# Patient Record
Sex: Female | Born: 1950
Health system: Southern US, Community
[De-identification: ages and names within clinical notes are randomized; demographics above are authoritative.]

## PROBLEM LIST (undated history)

## (undated) DIAGNOSIS — R112 Nausea with vomiting, unspecified: Secondary | ICD-10-CM

## (undated) DIAGNOSIS — I10 Essential (primary) hypertension: Secondary | ICD-10-CM

## (undated) DIAGNOSIS — Z9889 Other specified postprocedural states: Secondary | ICD-10-CM

## (undated) DIAGNOSIS — I4891 Unspecified atrial fibrillation: Secondary | ICD-10-CM

## (undated) DIAGNOSIS — M1990 Unspecified osteoarthritis, unspecified site: Secondary | ICD-10-CM

## (undated) DIAGNOSIS — J45909 Unspecified asthma, uncomplicated: Secondary | ICD-10-CM

## (undated) DIAGNOSIS — T8859XA Other complications of anesthesia, initial encounter: Secondary | ICD-10-CM

## (undated) DIAGNOSIS — H6690 Otitis media, unspecified, unspecified ear: Secondary | ICD-10-CM

## (undated) DIAGNOSIS — I499 Cardiac arrhythmia, unspecified: Secondary | ICD-10-CM

## (undated) DIAGNOSIS — J40 Bronchitis, not specified as acute or chronic: Secondary | ICD-10-CM

## (undated) DIAGNOSIS — F419 Anxiety disorder, unspecified: Secondary | ICD-10-CM

## (undated) DIAGNOSIS — A0472 Enterocolitis due to Clostridium difficile, not specified as recurrent: Secondary | ICD-10-CM

## (undated) DIAGNOSIS — T4145XA Adverse effect of unspecified anesthetic, initial encounter: Secondary | ICD-10-CM

## (undated) DIAGNOSIS — E785 Hyperlipidemia, unspecified: Secondary | ICD-10-CM

## (undated) HISTORY — PX: LEEP: SHX91

## (undated) HISTORY — DX: Hyperlipidemia, unspecified: E78.5

## (undated) HISTORY — DX: Unspecified osteoarthritis, unspecified site: M19.90

## (undated) HISTORY — DX: Essential (primary) hypertension: I10

## (undated) HISTORY — DX: Unspecified asthma, uncomplicated: J45.909

## (undated) HISTORY — PX: COLONOSCOPY: SHX174

## (undated) HISTORY — PX: UPPER GASTROINTESTINAL ENDOSCOPY: SHX188

## (undated) HISTORY — DX: Unspecified atrial fibrillation: I48.91

## (undated) HISTORY — PX: TONSILLECTOMY: SUR1361

## (undated) HISTORY — PX: KNEE ARTHROSCOPY: SUR90

---

## 1997-09-28 ENCOUNTER — Other Ambulatory Visit: Admission: RE | Admit: 1997-09-28 | Discharge: 1997-09-28 | Payer: Self-pay | Admitting: Gynecology

## 1998-03-11 ENCOUNTER — Other Ambulatory Visit: Admission: RE | Admit: 1998-03-11 | Discharge: 1998-03-11 | Payer: Self-pay | Admitting: Gynecology

## 1999-03-21 ENCOUNTER — Other Ambulatory Visit: Admission: RE | Admit: 1999-03-21 | Discharge: 1999-03-21 | Payer: Self-pay | Admitting: Gynecology

## 1999-04-12 ENCOUNTER — Encounter (INDEPENDENT_AMBULATORY_CARE_PROVIDER_SITE_OTHER): Payer: Self-pay | Admitting: *Deleted

## 1999-04-12 ENCOUNTER — Other Ambulatory Visit: Admission: RE | Admit: 1999-04-12 | Discharge: 1999-04-12 | Payer: Self-pay | Admitting: Gynecology

## 1999-05-11 ENCOUNTER — Encounter (INDEPENDENT_AMBULATORY_CARE_PROVIDER_SITE_OTHER): Payer: Self-pay | Admitting: Specialist

## 1999-05-11 ENCOUNTER — Ambulatory Visit (HOSPITAL_COMMUNITY): Admission: RE | Admit: 1999-05-11 | Discharge: 1999-05-11 | Payer: Self-pay | Admitting: Gynecology

## 1999-05-31 ENCOUNTER — Encounter: Admission: RE | Admit: 1999-05-31 | Discharge: 1999-05-31 | Payer: Self-pay | Admitting: Orthopedic Surgery

## 1999-05-31 ENCOUNTER — Encounter: Payer: Self-pay | Admitting: Orthopedic Surgery

## 1999-06-22 ENCOUNTER — Encounter: Payer: Self-pay | Admitting: Gynecology

## 1999-06-22 ENCOUNTER — Encounter: Admission: RE | Admit: 1999-06-22 | Discharge: 1999-06-22 | Payer: Self-pay | Admitting: Gynecology

## 1999-08-29 ENCOUNTER — Other Ambulatory Visit: Admission: RE | Admit: 1999-08-29 | Discharge: 1999-08-29 | Payer: Self-pay | Admitting: Gynecology

## 2000-03-19 ENCOUNTER — Other Ambulatory Visit: Admission: RE | Admit: 2000-03-19 | Discharge: 2000-03-19 | Payer: Self-pay | Admitting: Gynecology

## 2000-09-16 ENCOUNTER — Other Ambulatory Visit: Admission: RE | Admit: 2000-09-16 | Discharge: 2000-09-16 | Payer: Self-pay | Admitting: Gynecology

## 2001-03-17 ENCOUNTER — Other Ambulatory Visit: Admission: RE | Admit: 2001-03-17 | Discharge: 2001-03-17 | Payer: Self-pay | Admitting: Gynecology

## 2002-03-23 ENCOUNTER — Other Ambulatory Visit: Admission: RE | Admit: 2002-03-23 | Discharge: 2002-03-23 | Payer: Self-pay | Admitting: Gynecology

## 2003-03-29 ENCOUNTER — Other Ambulatory Visit: Admission: RE | Admit: 2003-03-29 | Discharge: 2003-03-29 | Payer: Self-pay | Admitting: Gynecology

## 2004-03-30 ENCOUNTER — Other Ambulatory Visit: Admission: RE | Admit: 2004-03-30 | Discharge: 2004-03-30 | Payer: Self-pay | Admitting: Gynecology

## 2005-04-23 ENCOUNTER — Other Ambulatory Visit: Admission: RE | Admit: 2005-04-23 | Discharge: 2005-04-23 | Payer: Self-pay | Admitting: Gynecology

## 2005-05-18 ENCOUNTER — Encounter: Admission: RE | Admit: 2005-05-18 | Discharge: 2005-05-18 | Payer: Self-pay | Admitting: Gynecology

## 2005-09-05 ENCOUNTER — Encounter: Admission: RE | Admit: 2005-09-05 | Discharge: 2005-09-05 | Payer: Self-pay | Admitting: Gynecology

## 2005-09-15 ENCOUNTER — Encounter: Admission: RE | Admit: 2005-09-15 | Discharge: 2005-09-15 | Payer: Self-pay | Admitting: Orthopedic Surgery

## 2005-10-03 ENCOUNTER — Encounter: Admission: RE | Admit: 2005-10-03 | Discharge: 2005-10-03 | Payer: Self-pay | Admitting: Orthopedic Surgery

## 2006-04-29 ENCOUNTER — Other Ambulatory Visit: Admission: RE | Admit: 2006-04-29 | Discharge: 2006-04-29 | Payer: Self-pay | Admitting: Gynecology

## 2006-09-11 ENCOUNTER — Encounter: Admission: RE | Admit: 2006-09-11 | Discharge: 2006-09-11 | Payer: Self-pay

## 2006-11-25 ENCOUNTER — Encounter: Admission: RE | Admit: 2006-11-25 | Discharge: 2006-11-25 | Payer: Self-pay | Admitting: Orthopedic Surgery

## 2006-12-12 ENCOUNTER — Encounter: Admission: RE | Admit: 2006-12-12 | Discharge: 2006-12-12 | Payer: Self-pay | Admitting: Internal Medicine

## 2007-09-24 ENCOUNTER — Encounter: Admission: RE | Admit: 2007-09-24 | Discharge: 2007-09-24 | Payer: Self-pay | Admitting: Gynecology

## 2008-01-20 ENCOUNTER — Encounter: Admission: RE | Admit: 2008-01-20 | Discharge: 2008-01-20 | Payer: Self-pay | Admitting: Internal Medicine

## 2008-04-27 ENCOUNTER — Encounter (INDEPENDENT_AMBULATORY_CARE_PROVIDER_SITE_OTHER): Payer: Self-pay | Admitting: *Deleted

## 2008-09-27 ENCOUNTER — Encounter: Admission: RE | Admit: 2008-09-27 | Discharge: 2008-09-27 | Payer: Self-pay | Admitting: Gynecology

## 2008-11-03 ENCOUNTER — Ambulatory Visit (HOSPITAL_BASED_OUTPATIENT_CLINIC_OR_DEPARTMENT_OTHER): Admission: RE | Admit: 2008-11-03 | Discharge: 2008-11-03 | Payer: Self-pay | Admitting: Gynecology

## 2008-11-03 ENCOUNTER — Encounter (INDEPENDENT_AMBULATORY_CARE_PROVIDER_SITE_OTHER): Payer: Self-pay | Admitting: Gynecology

## 2008-11-16 ENCOUNTER — Encounter: Admission: RE | Admit: 2008-11-16 | Discharge: 2008-11-16 | Payer: Self-pay | Admitting: Otolaryngology

## 2009-02-23 ENCOUNTER — Telehealth: Payer: Self-pay | Admitting: Internal Medicine

## 2009-10-12 ENCOUNTER — Encounter: Admission: RE | Admit: 2009-10-12 | Discharge: 2009-10-12 | Payer: Self-pay | Admitting: Gynecology

## 2010-01-31 NOTE — Progress Notes (Signed)
Summary: Schedule Colonoscopy  Phone Note Outgoing Call Call back at [facility] Phone [phone]   Call placed by: Harlow Mares CMA Duncan Dull),  February 23, 2009 3:58 PM Call placed to: Patient Summary of Call: patient states that Dr Felipa Eth told her she can wait a couple more years to have her colonoscopy done. I advised her  that Dr. Marina Goodell which is her GI MD. Has reviewed her chart and decided that she is was due for her repeat colonoscopy in 2010. patient declined to schedule colonoscopy.  Initial call taken by: Harlow Mares CMA Duncan Dull),  February 23, 2009 4:02 PM

## 2010-04-05 LAB — POCT HEMOGLOBIN-HEMACUE: Hemoglobin: 16.6 g/dL — ABNORMAL HIGH (ref 12.0–15.0)

## 2010-05-11 ENCOUNTER — Other Ambulatory Visit: Payer: Self-pay | Admitting: Gynecology

## 2010-05-14 ENCOUNTER — Emergency Department (HOSPITAL_COMMUNITY)
Admission: EM | Admit: 2010-05-14 | Discharge: 2010-05-14 | Disposition: A | Discharge status: Home / Self Care | Payer: BC Managed Care – PPO | Attending: Emergency Medicine | Admitting: Emergency Medicine

## 2010-05-14 ENCOUNTER — Emergency Department (HOSPITAL_COMMUNITY): Payer: BC Managed Care – PPO

## 2010-05-14 DIAGNOSIS — W010XXA Fall on same level from slipping, tripping and stumbling without subsequent striking against object, initial encounter: Secondary | ICD-10-CM | POA: Insufficient documentation

## 2010-05-14 DIAGNOSIS — S7010XA Contusion of unspecified thigh, initial encounter: Secondary | ICD-10-CM | POA: Insufficient documentation

## 2010-05-14 DIAGNOSIS — M79609 Pain in unspecified limb: Secondary | ICD-10-CM | POA: Insufficient documentation

## 2010-05-19 NOTE — Op Note (Signed)
[facility]  Patient:    Danielle Hayes, Danielle Hayes                        MRN: [id number] Proc. Date: 05/11/99 Adm. Date:  [id number] Attending:  Katrina Stack CC:         Gretta Cool, M.D.             [facility]                           Operative Report  PREOPERATIVE DIAGNOSIS:  High-grade squamous intraepithelial lesion, recurrent with endocervical extension beyond and inadequate colposcopy.  POSTOPERATIVE DIAGNOSIS:  High-grade squamous intraepithelial lesion, recurrent with endocervical extension beyond and inadequate colposcopy.  OPERATION:  LEEP cone.  SURGEON:  Gretta Cool, M.D.  ANESTHESIA:  Paracervical block with IV sedation.  DESCRIPTION OF PROCEDURE:  Under excellent paracervical block with IV sedation a LEEP cone was taken using the smallest [place] loop to remove a cylinder of endocervical tissue including all of the transformation zone and extending approximately 1.5 cm down the canal of the cervix.  Her cervix is already significantly shortened because of previous cone, with extensive lesion. Bleeding was virtually nonexistent and no therapy was required to control any significant bleeding.  At this point the procedure was terminated without complication.  The patient returned to the recovery room in excellent condition. DD:  05/11/99 TD:  05/11/99 Job: [id number]

## 2010-06-05 ENCOUNTER — Ambulatory Visit (HOSPITAL_COMMUNITY)
Admission: RE | Admit: 2010-06-05 | Discharge: 2010-06-05 | Disposition: A | Discharge status: Home / Self Care | Payer: BC Managed Care – PPO | Source: Ambulatory Visit | Attending: Orthopedic Surgery | Admitting: Orthopedic Surgery

## 2010-06-05 DIAGNOSIS — M79609 Pain in unspecified limb: Secondary | ICD-10-CM | POA: Insufficient documentation

## 2010-06-05 DIAGNOSIS — M7989 Other specified soft tissue disorders: Secondary | ICD-10-CM

## 2010-09-08 ENCOUNTER — Other Ambulatory Visit: Payer: Self-pay | Admitting: Gynecology

## 2010-09-08 DIAGNOSIS — Z1231 Encounter for screening mammogram for malignant neoplasm of breast: Secondary | ICD-10-CM

## 2010-10-17 ENCOUNTER — Ambulatory Visit
Admission: RE | Admit: 2010-10-17 | Discharge: 2010-10-17 | Disposition: A | Discharge status: Home / Self Care | Payer: BC Managed Care – PPO | Source: Ambulatory Visit | Attending: Gynecology | Admitting: Gynecology

## 2010-10-17 DIAGNOSIS — Z1231 Encounter for screening mammogram for malignant neoplasm of breast: Secondary | ICD-10-CM

## 2010-11-09 ENCOUNTER — Other Ambulatory Visit: Payer: Self-pay | Admitting: Dermatology

## 2011-05-15 ENCOUNTER — Other Ambulatory Visit: Payer: Self-pay | Admitting: Gynecology

## 2011-06-13 ENCOUNTER — Other Ambulatory Visit: Payer: Self-pay | Admitting: Dermatology

## 2011-09-27 ENCOUNTER — Other Ambulatory Visit: Payer: Self-pay | Admitting: Gynecology

## 2011-09-27 DIAGNOSIS — Z1231 Encounter for screening mammogram for malignant neoplasm of breast: Secondary | ICD-10-CM

## 2011-10-18 ENCOUNTER — Ambulatory Visit
Admission: RE | Admit: 2011-10-18 | Discharge: 2011-10-18 | Disposition: A | Discharge status: Home / Self Care | Payer: BC Managed Care – PPO | Source: Ambulatory Visit | Attending: Gynecology | Admitting: Gynecology

## 2011-10-18 DIAGNOSIS — Z1231 Encounter for screening mammogram for malignant neoplasm of breast: Secondary | ICD-10-CM

## 2012-09-09 ENCOUNTER — Other Ambulatory Visit: Payer: Self-pay

## 2012-09-09 DIAGNOSIS — Z1231 Encounter for screening mammogram for malignant neoplasm of breast: Secondary | ICD-10-CM

## 2012-10-21 ENCOUNTER — Ambulatory Visit
Admission: RE | Admit: 2012-10-21 | Discharge: 2012-10-21 | Disposition: A | Discharge status: Home / Self Care | Payer: BC Managed Care – PPO | Source: Ambulatory Visit

## 2012-10-21 DIAGNOSIS — Z1231 Encounter for screening mammogram for malignant neoplasm of breast: Secondary | ICD-10-CM

## 2013-03-31 ENCOUNTER — Encounter: Payer: Self-pay | Admitting: Internal Medicine

## 2013-04-09 ENCOUNTER — Encounter: Payer: Self-pay | Admitting: Internal Medicine

## 2013-05-14 ENCOUNTER — Encounter: Payer: BC Managed Care – PPO | Admitting: Internal Medicine

## 2013-05-27 ENCOUNTER — Ambulatory Visit (AMBULATORY_SURGERY_CENTER): Payer: Self-pay

## 2013-05-27 VITALS — Ht 61.75 in | Wt 140.8 lb

## 2013-05-27 DIAGNOSIS — Z1211 Encounter for screening for malignant neoplasm of colon: Secondary | ICD-10-CM

## 2013-05-27 MED ORDER — MOVIPREP 100 G PO SOLR: 1.0000 | Freq: Once | ORAL | Status: DC

## 2013-05-27 NOTE — Progress Notes (Signed)
No allergies to eggs or soy No past problems with anesthesia No diet/weight loss meds No home oxygen  Has email  Emmi instructions given for colonoscopy 

## 2013-05-29 ENCOUNTER — Encounter: Payer: Self-pay | Admitting: Internal Medicine

## 2013-06-09 ENCOUNTER — Encounter: Payer: BC Managed Care – PPO | Admitting: Internal Medicine

## 2013-06-17 ENCOUNTER — Other Ambulatory Visit: Payer: Self-pay | Admitting: Dermatology

## 2013-07-23 ENCOUNTER — Encounter: Payer: Self-pay | Admitting: Internal Medicine

## 2013-07-23 ENCOUNTER — Ambulatory Visit (AMBULATORY_SURGERY_CENTER): Payer: BC Managed Care – PPO | Admitting: Internal Medicine

## 2013-07-23 VITALS — BP 136/65 | HR 70 | Temp 98.4°F | Resp 26 | Ht 61.0 in | Wt 140.0 lb

## 2013-07-23 DIAGNOSIS — Z1211 Encounter for screening for malignant neoplasm of colon: Secondary | ICD-10-CM

## 2013-07-23 MED ORDER — SODIUM CHLORIDE 0.9 % IV SOLN: 500.0000 mL | INTRAVENOUS | Status: DC

## 2013-07-23 NOTE — Progress Notes (Signed)
A/ox3, pleased with MAC, report to RN 

## 2013-07-23 NOTE — Patient Instructions (Signed)
YOU HAD AN ENDOSCOPIC PROCEDURE TODAY AT THE [facility]: Refer to the procedure report that was given to you for any specific questions about what was found during the examination.  If the procedure report does not answer your questions, please call your gastroenterologist to clarify.  If you requested that your care partner not be given the details of your procedure findings, then the procedure report has been included in a sealed envelope for you to review at your convenience later.  YOU SHOULD EXPECT: Some feelings of bloating in the abdomen. Passage of more gas than usual.  Walking can help get rid of the air that was put into your GI tract during the procedure and reduce the bloating. If you had a lower endoscopy (such as a colonoscopy or flexible sigmoidoscopy) you may notice spotting of blood in your stool or on the toilet paper. If you underwent a bowel prep for your procedure, then you may not have a normal bowel movement for a few days.  DIET: Your first meal following the procedure should be a light meal and then it is ok to progress to your normal diet.  A half-sandwich or bowl of soup is an example of a good first meal.  Heavy or fried foods are harder to digest and may make you feel nauseous or bloated.  Likewise meals heavy in dairy and vegetables can cause extra gas to form and this can also increase the bloating.  Drink plenty of fluids but you should avoid alcoholic beverages for 24 hours.  ACTIVITY: Your care partner should take you home directly after the procedure.  You should plan to take it easy, moving slowly for the rest of the day.  You can resume normal activity the day after the procedure however you should NOT DRIVE or use heavy machinery for 24 hours (because of the sedation medicines used during the test).    SYMPTOMS TO REPORT IMMEDIATELY: A gastroenterologist can be reached at any hour.  During normal business hours, 8:30 AM to 5:00 PM Monday through Friday,  call [phone].  After hours and on weekends, please call the GI answering service at [phone] who will take a message and have the physician on call contact you.   Following lower endoscopy (colonoscopy or flexible sigmoidoscopy):  Excessive amounts of blood in the stool  Significant tenderness or worsening of abdominal pains  Swelling of the abdomen that is new, acute  Fever of 100F or higher  FOLLOW UP: If any biopsies were taken you will be contacted by phone or by letter within the next 1-3 weeks.  Call your gastroenterologist if you have not heard about the biopsies in 3 weeks.  Our staff will call the home number listed on your records the next business day following your procedure to check on you and address any questions or concerns that you may have at that time regarding the information given to you following your procedure. This is a courtesy call and so if there is no answer at the home number and we have not heard from you through the emergency physician on call, we will assume that you have returned to your regular daily activities without incident.  SIGNATURES/CONFIDENTIALITY: You and/or your care partner have signed paperwork which will be entered into your electronic medical record.  These signatures attest to the fact that that the information above on your After Visit Summary has been reviewed and is understood.  Full responsibility of the confidentiality of this   discharge information lies with you and/or your care-partner.  Irregular heart-rate noted.  Dr. Henrene Pastor to look at it.  Patient's husband already called the Cardiologist who is his friend.

## 2013-07-23 NOTE — Op Note (Signed)
[place]  [address] [place], [address]   COLONOSCOPY PROCEDURE REPORT  PATIENT: Danielle Hayes, Danielle Hayes  MR#: [id number] BIRTHDATE: 06-06-1950 , 62  yrs. old GENDER: Female ENDOSCOPIST: Eustace Quail, MD REFERRED [id number] Avva, M.D. PROCEDURE DATE:  07/23/2013 PROCEDURE:   Colonoscopy, screening First Screening Colonoscopy - Avg.  risk and is 50 yrs.  old or older - No.  Prior Negative Screening - Now for repeat screening. 10 or more years since last screening  History of Adenoma - Now for follow-up colonoscopy & has been > or = to 3 yrs.  N/A  Polyps Removed Today? No.  Recommend repeat exam, <10 yrs? No. ASA CLASS:   Class II INDICATIONS:average risk screening.   Normal index exam 10 years ago MEDICATIONS: MAC sedation, administered by CRNA and propofol (Diprivan) 400mg  IV  DESCRIPTION OF PROCEDURE:   After the risks benefits and alternatives of the procedure were thoroughly explained, informed consent was obtained.  A digital rectal exam revealed no abnormalities of the rectum.   The [id number]  endoscope was introduced through the anus and advanced to the cecum, which was identified by both the appendix and ileocecal valve. No adverse events experienced.   The quality of the prep was excellent, using MoviPrep  The instrument was then slowly withdrawn as the colon was fully examined.      COLON FINDINGS: A normal appearing cecum, ileocecal valve, and appendiceal orifice were identified.  The ascending, hepatic flexure, transverse, splenic flexure, descending, sigmoid colon and rectum appeared unremarkable.  No polyps or cancers were seen. Retroflexed views revealed internal hemorrhoids. The time to cecum=3 minutes 35 seconds.  Withdrawal time=9 minutes 53 seconds. The scope was withdrawn and the procedure completed.  COMPLICATIONS: There were no complications.  ENDOSCOPIC IMPRESSION: Normal colon  RECOMMENDATIONS: Continue current  colorectal screening recommendations for "routine risk" patients with a repeat colonoscopy in 10 years.   eSigned:  Eustace Quail, MD 07/23/2013 8:38 AM   cc: Prince Solian, MD and The Patient

## 2013-07-24 ENCOUNTER — Telehealth: Payer: Self-pay | Admitting: *Deleted

## 2013-07-24 NOTE — Telephone Encounter (Signed)
  Follow up Call-  Call back number 07/23/2013  Post procedure Call Back phone  # [phone]  Permission to leave phone message Yes     Patient questions:  Do you have a fever, pain , or abdominal swelling? No. Pain Score  0 *  Have you tolerated food without any problems? Yes.    Have you been able to return to your normal activities? Yes.    Do you have any questions about your discharge instructions: Diet   No. Medications  No. Follow up visit  No.  Do you have questions or concerns about your Care? No.  Actions: * If pain score is 4 or above: No action needed, pain <4.

## 2013-09-23 ENCOUNTER — Other Ambulatory Visit: Payer: Self-pay

## 2013-09-23 DIAGNOSIS — Z1231 Encounter for screening mammogram for malignant neoplasm of breast: Secondary | ICD-10-CM

## 2013-10-22 ENCOUNTER — Ambulatory Visit
Admission: RE | Admit: 2013-10-22 | Discharge: 2013-10-22 | Disposition: A | Discharge status: Home / Self Care | Payer: BC Managed Care – PPO | Source: Ambulatory Visit

## 2013-10-22 DIAGNOSIS — Z1231 Encounter for screening mammogram for malignant neoplasm of breast: Secondary | ICD-10-CM

## 2014-06-04 ENCOUNTER — Ambulatory Visit: Payer: Self-pay | Admitting: Orthopedic Surgery

## 2014-06-04 NOTE — Progress Notes (Signed)
Preoperative surgical orders have been place into the Epic hospital system for Danielle Hayes on 06/04/2014, 12:54 PM  by Mickel Crow for surgery on 06-28-2014.  Preop Total Knee orders including Experal, IV Tylenol, and IV Decadron as long as there are no contraindications to the above medications. Arlee Muslim, PA-C

## 2014-06-21 ENCOUNTER — Other Ambulatory Visit (HOSPITAL_COMMUNITY): Payer: Self-pay | Admitting: Anesthesiology

## 2014-06-21 NOTE — Progress Notes (Signed)
04/12/14-Office visit Dr. Dagmar Hait on chart. 04/16/14-Pre-operative clearance from Dr. Wynonia Lawman on chart. 04/13/14-Pre-operative clearance from Dr. Dagmar Hait on chart. 01/23/13-EKG and office note from Dr. Wynonia Lawman on chart. 01/20/13-Office visit from Dr. Wynonia Lawman on chart. 01/12/13-CXR from [facility] on chart. 01/21/13-Echocardiogram on chart from [facility] .

## 2014-06-21 NOTE — Patient Instructions (Addendum)
Danielle Hayes  06/21/2014   Your procedure is scheduled on: Monday 06/28/2014  Report to [facility] Main  Entrance and follow signs to               [place] at [place] AM.  Call this number if you have problems the morning of surgery [phone]   Remember: ONLY 1 PERSON MAY GO WITH YOU TO SHORT STAY TO GET  READY MORNING OF [place].   Do not eat food or drink liquids :After Midnight.     Take these medicines the morning of surgery with A SIP OF WATER: AMLODIPINE (NORVASC), BUPRIPION (WELLBUTRIN XL)                               You may not have any metal on your body including hair pins and              piercings  Do not wear jewelry, make-up, lotions, powders or perfumes, deodorant             Do not wear nail polish.  Do not shave  48 hours prior to surgery.              Men may shave face and neck.   Do not bring valuables to the hospital. [place].  Contacts, dentures or bridgework may not be worn into surgery.  Leave suitcase in the car. After surgery it may be brought to your room.     Patients discharged the day of surgery will not be allowed to drive home.  Name and phone number of your driver:  Special Instructions: N/A              Please read over the following fact sheets you were given: _____________________________________________________________________             [facility] - Preparing for Surgery Before surgery, you can play an important role.  Because skin is not sterile, your skin needs to be as free of germs as possible.  You can reduce the number of germs on your skin by washing with CHG (chlorahexidine gluconate) soap before surgery.  CHG is an antiseptic cleaner which kills germs and bonds with the skin to continue killing germs even after washing. Please DO NOT use if you have an allergy to CHG or antibacterial soaps.  If  your skin becomes reddened/irritated stop using the CHG and inform your nurse when you arrive at Short Stay. Do not shave (including legs and underarms) for at least 48 hours prior to the first CHG shower.  You may shave your face/neck. Please follow these instructions carefully:  1.  Shower with CHG Soap the night before surgery and the  morning of Surgery.  2.  If you choose to wash your hair, wash your hair first as usual with your  normal  shampoo.  3.  After you shampoo, rinse your hair and body thoroughly to remove the  shampoo.                           4.  Use CHG as you would any other liquid soap.  You can  apply chg directly  to the skin and wash                       Gently with a scrungie or clean washcloth.  5.  Apply the CHG Soap to your body ONLY FROM THE NECK DOWN.   Do not use on face/ open                           Wound or open sores. Avoid contact with eyes, ears mouth and genitals (private parts).                       Wash face,  Genitals (private parts) with your normal soap.             6.  Wash thoroughly, paying special attention to the area where your surgery  will be performed.  7.  Thoroughly rinse your body with warm water from the neck down.  8.  DO NOT shower/wash with your normal soap after using and rinsing off  the CHG Soap.                9.  Pat yourself dry with a clean towel.            10.  Wear clean pajamas.            11.  Place clean sheets on your bed the night of your first shower and do not  sleep with pets. Day of Surgery : Do not apply any lotions/deodorants the morning of surgery.  Please wear clean clothes to the hospital/surgery center.  FAILURE TO FOLLOW THESE INSTRUCTIONS MAY RESULT IN THE CANCELLATION OF YOUR SURGERY PATIENT SIGNATURE_________________________________  NURSE SIGNATURE__________________________________  ________________________________________________________________________   [name]  An incentive  spirometer is a tool that can help keep your lungs clear and active. This tool measures how well you are filling your lungs with each breath. Taking long deep breaths may help reverse or decrease the chance of developing breathing (pulmonary) problems (especially infection) following:  A long period of time when you are unable to move or be active. BEFORE THE PROCEDURE   If the spirometer includes an indicator to show your best effort, your nurse or respiratory therapist will set it to a desired goal.  If possible, sit up straight or lean slightly forward. Try not to slouch.  Hold the incentive spirometer in an upright position. INSTRUCTIONS FOR USE   Sit on the edge of your bed if possible, or sit up as far as you can in bed or on a chair.  Hold the incentive spirometer in an upright position.  Breathe out normally.  Place the mouthpiece in your mouth and seal your lips tightly around it.  Breathe in slowly and as deeply as possible, raising the piston or the ball toward the top of the column.  Hold your breath for 3-5 seconds or for as long as possible. Allow the piston or ball to fall to the bottom of the column.  Remove the mouthpiece from your mouth and breathe out normally.  Rest for a few seconds and repeat Steps 1 through 7 at least 10 times every 1-2 hours when you are awake. Take your time and take a few normal breaths between deep breaths.  The spirometer may include an indicator to show your best effort. Use the indicator as a goal to work toward during each  repetition.  After each set of 10 deep breaths, practice coughing to be sure your lungs are clear. If you have an incision (the cut made at the time of surgery), support your incision when coughing by placing a pillow or rolled up towels firmly against it. Once you are able to get out of bed, walk around indoors and cough well. You may stop using the incentive spirometer when instructed by your caregiver.  RISKS AND  COMPLICATIONS  Take your time so you do not get dizzy or light-headed.  If you are in pain, you may need to take or ask for pain medication before doing incentive spirometry. It is harder to take a deep breath if you are having pain. AFTER USE  Rest and breathe slowly and easily.  It can be helpful to keep track of a log of your progress. Your caregiver can provide you with a simple table to help with this. If you are using the spirometer at home, follow these instructions: [place] IF:   You are having difficultly using the spirometer.  You have trouble using the spirometer as often as instructed.  Your pain medication is not giving enough relief while using the spirometer.  You develop fever of 100.5 F (38.1 C) or higher. SEEK IMMEDIATE MEDICAL CARE IF:   You cough up bloody sputum that had not been present before.  You develop fever of 102 F (38.9 C) or greater.  You develop worsening pain at or near the incision site. MAKE SURE YOU:   Understand these instructions.  Will watch your condition.  Will get help right away if you are not doing well or get worse. Document Released: 04/30/2006 Document Revised: 03/12/2011 Document Reviewed: 07/01/2006 ExitCare Patient Information 2014 Exit[employer], [place].   ________________________________________________________________________  WHAT IS A BLOOD TRANSFUSION? Blood Transfusion Information  A transfusion is the replacement of blood or some of its parts. Blood is made up of multiple cells which provide different functions.  Red blood cells carry oxygen and are used for blood loss replacement.  White blood cells fight against infection.  Platelets control bleeding.  Plasma helps clot blood.  Other blood products are available for specialized needs, such as hemophilia or other clotting disorders. BEFORE THE TRANSFUSION  Who gives blood for transfusions?   Healthy volunteers who are fully evaluated to make sure  their blood is safe. This is blood bank blood. Transfusion therapy is the safest it has ever been in the practice of medicine. Before blood is taken from a donor, a complete history is taken to make sure that person has no history of diseases nor engages in risky social behavior (examples are intravenous drug use or sexual activity with multiple partners). The donor's travel history is screened to minimize risk of transmitting infections, such as malaria. The donated blood is tested for signs of infectious diseases, such as HIV and hepatitis. The blood is then tested to be sure it is compatible with you in order to minimize the chance of a transfusion reaction. If you or a relative donates blood, this is often done in anticipation of surgery and is not appropriate for emergency situations. It takes many days to process the donated blood. RISKS AND COMPLICATIONS Although transfusion therapy is very safe and saves many lives, the main dangers of transfusion include:   Getting an infectious disease.  Developing a transfusion reaction. This is an allergic reaction to something in the blood you were given. Every precaution is taken to prevent this.  The decision to have a blood transfusion has been considered carefully by your caregiver before blood is given. Blood is not given unless the benefits outweigh the risks. AFTER THE TRANSFUSION  Right after receiving a blood transfusion, you will usually feel much better and more energetic. This is especially true if your red blood cells have gotten low (anemic). The transfusion raises the level of the red blood cells which carry oxygen, and this usually causes an energy increase.  The nurse administering the transfusion will monitor you carefully for complications. HOME CARE INSTRUCTIONS  No special instructions are needed after a transfusion. You may find your energy is better. Speak with your caregiver about any limitations on activity for underlying diseases  you may have. SEEK MEDICAL CARE IF:   Your condition is not improving after your transfusion.  You develop redness or irritation at the intravenous (IV) site. SEEK IMMEDIATE MEDICAL CARE IF:  Any of the following symptoms occur over the next 12 hours:  Shaking chills.  You have a temperature by mouth above 102 F (38.9 C), not controlled by medicine.  Chest, back, or muscle pain.  People around you feel you are not acting correctly or are confused.  Shortness of breath or difficulty breathing.  Dizziness and fainting.  You get a rash or develop hives.  You have a decrease in urine output.  Your urine turns a dark color or changes to pink, red, or brown. Any of the following symptoms occur over the next 10 days:  You have a temperature by mouth above 102 F (38.9 C), not controlled by medicine.  Shortness of breath.  Weakness after normal activity.  The white part of the eye turns yellow (jaundice).  You have a decrease in the amount of urine or are urinating less often.  Your urine turns a dark color or changes to pink, red, or brown. Document Released: 12/16/1999 Document Revised: 03/12/2011 Document Reviewed: 08/04/2007 [facility] Patient Information 2014 [place], [place].  _______________________________________________________________________

## 2014-06-22 ENCOUNTER — Encounter (HOSPITAL_COMMUNITY): Payer: Self-pay

## 2014-06-22 ENCOUNTER — Encounter (HOSPITAL_COMMUNITY)
Admission: RE | Admit: 2014-06-22 | Discharge: 2014-06-22 | Disposition: A | Discharge status: Home / Self Care | Payer: BLUE CROSS/BLUE SHIELD | Source: Ambulatory Visit | Attending: Orthopedic Surgery | Admitting: Orthopedic Surgery

## 2014-06-22 DIAGNOSIS — Z01812 Encounter for preprocedural laboratory examination: Secondary | ICD-10-CM | POA: Diagnosis not present

## 2014-06-22 DIAGNOSIS — Z01810 Encounter for preprocedural cardiovascular examination: Secondary | ICD-10-CM | POA: Diagnosis not present

## 2014-06-22 DIAGNOSIS — M1712 Unilateral primary osteoarthritis, left knee: Secondary | ICD-10-CM | POA: Diagnosis not present

## 2014-06-22 HISTORY — DX: Other specified postprocedural states: Z98.890

## 2014-06-22 HISTORY — DX: Other complications of anesthesia, initial encounter: T88.59XA

## 2014-06-22 HISTORY — DX: Anxiety disorder, unspecified: F41.9

## 2014-06-22 HISTORY — DX: Cardiac arrhythmia, unspecified: I49.9

## 2014-06-22 HISTORY — DX: Adverse effect of unspecified anesthetic, initial encounter: T41.45XA

## 2014-06-22 HISTORY — DX: Other specified postprocedural states: R11.2

## 2014-06-22 LAB — CBC
HCT: 42.5 % (ref 36.0–46.0)
Hemoglobin: 15.3 g/dL — ABNORMAL HIGH (ref 12.0–15.0)
MCH: 32.9 pg (ref 26.0–34.0)
MCHC: 36 g/dL (ref 30.0–36.0)
MCV: 91.4 fL (ref 78.0–100.0)
PLATELETS: 193 10*3/uL (ref 150–400)
RBC: 4.65 MIL/uL (ref 3.87–5.11)
RDW: 13.2 % (ref 11.5–15.5)
WBC: 4.8 10*3/uL (ref 4.0–10.5)

## 2014-06-22 LAB — COMPREHENSIVE METABOLIC PANEL
ALBUMIN: 4 g/dL (ref 3.5–5.0)
ALK PHOS: 78 U/L (ref 38–126)
ALT: 30 U/L (ref 14–54)
AST: 30 U/L (ref 15–41)
Anion gap: 7 (ref 5–15)
BUN: 17 mg/dL (ref 6–20)
CHLORIDE: 102 mmol/L (ref 101–111)
CO2: 29 mmol/L (ref 22–32)
Calcium: 9 mg/dL (ref 8.9–10.3)
Creatinine, Ser: 0.56 mg/dL (ref 0.44–1.00)
GFR calc Af Amer: >60 mL/min (ref >60)
GFR calc non Af Amer: >60 mL/min (ref >60)
Glucose, Bld: 85 mg/dL (ref 65–99)
POTASSIUM: 3.7 mmol/L (ref 3.5–5.1)
Sodium: 138 mmol/L (ref 135–145)
Total Bilirubin: 1.1 mg/dL (ref 0.3–1.2)
Total Protein: 6.8 g/dL (ref 6.5–8.1)

## 2014-06-22 LAB — URINALYSIS, ROUTINE W REFLEX MICROSCOPIC
Bilirubin Urine: NEGATIVE
Glucose, UA: NEGATIVE mg/dL
Hgb urine dipstick: NEGATIVE
KETONES UR: NEGATIVE mg/dL
LEUKOCYTES UA: NEGATIVE
Nitrite: NEGATIVE
PROTEIN: NEGATIVE mg/dL
Specific Gravity, Urine: 1.002 — ABNORMAL LOW (ref 1.005–1.030)
UROBILINOGEN UA: 0.2 mg/dL (ref 0.0–1.0)
pH: 6.5 (ref 5.0–8.0)

## 2014-06-22 LAB — PROTIME-INR
INR: 1.03 (ref 0.00–1.49)
PROTHROMBIN TIME: 13.7 s (ref 11.6–15.2)

## 2014-06-22 LAB — SURGICAL PCR SCREEN
MRSA, PCR: NEGATIVE
STAPHYLOCOCCUS AUREUS: NEGATIVE

## 2014-06-22 LAB — APTT: aPTT: 30 seconds (ref 24–37)

## 2014-06-27 ENCOUNTER — Ambulatory Visit: Payer: Self-pay | Admitting: Orthopedic Surgery

## 2014-06-27 NOTE — H&P (Signed)
Danielle Hayes DOB: Jun 06, 1950 Married / Language: English / Race: White Female Date of Admission:  06/28/2014 CC:  Left knee pain History of Present Illness The patient is a 64 year old female who comes in for a preoperative History and Physical. The patient is scheduled for a left total knee arthroplasty to be performed by Dr. Dione Plover. Aluisio, MD at [facility] on 06-28-2014.  The patient is a 64 year old female who presented for follow up of their knee. The patient is being followed for their bilateral knee pain and osteoarthritis. They have undegone cortisone injection in the right knee and a series of Synvisc in the left knee. Symptoms reported include: pain (in the left knee). The patient feels that they are doing poorly and report their pain level to be mild to moderate (in the left). The following medication has been used for pain control: Diclofenac.  Her left knee is what is giving more trouble. She has had viscosupplements and cortisone yet still has pain. She is ready to proceed with knee replacement at this time. Risks and benefits of the surgery have been discussed with the patient and they elect to proceed with surgery.  There are on active contraindications to upcoming procedure such as ongoing infection or progressive neurological disease.  Problem List/Past Medical Primary localized osteoarthritis of left knee (M17.12) Osteoarthritis Hypercholesterolemia Vertigo Hypertension Benign Positional Vertigo Anxiety Disorder  Allergies Synvisc *MUSCULOSKELETAL THERAPY AGENTS* local reaction  Family History Cancer brother Congestive Heart Failure mother Hypertension First Degree Relatives. brother Osteoarthritis mother Heart Disease mother and grandfather mothers side  Social History  Drug/Alcohol Rehab (Previously) no Drug/Alcohol Rehab (Currently) no Children 0 Alcohol use current drinker; drinks wine; less than 5 per week Exercise Exercises  daily; does running / walking, individual sport and gym / weights Current work status retired Number of flights of stairs before winded 2-3 Tobacco / smoke exposure no Pain Contract no Illicit drug use no Marital status married Living situation live with spouse Tobacco use Never smoker. never smoker Advance Directives Living Will, Healthcare POA  Medication History Lisinopril (10MG  Tablet, Oral) Active. BuPROPion HCl ER (XL) (300MG  Tablet ER 24HR, Oral) Active. Atorvastatin Calcium (80MG  Tablet, Oral) Active. Triamterene-HCTZ (37.5-25MG  Tablet, Oral) Active. Diclofenac Sodium (50MG  Tablet DR, Oral) Active. Pennsaid (1.5% Solution, Transdermal) Active. Prometrium (200MG  Capsule, Oral) Active. (every 3 months for 12 days) Vagifem (10MCG Tablet, Vaginal) Active. (2x week) Divigel (1MG /GM Gel, Transdermal) Active. Multiple Vitamin (Oral) Active. Vitamin D (Oral) Specific dose unknown - Active. Omega 3 (Oral) Specific dose unknown - Active. Cal-Mag Aspartate (Oral) Specific dose unknown - Active. Glucosamine Sulfate (Oral) Specific dose unknown - Active. Aspirin Adult Low Strength (81MG  Tablet DR, Oral) Active.   Past Surgical History Arthroscopy of Knee bilateral Tonsillectomy   Review of Systems General Not Present- Chills, Fatigue, Fever, Memory Loss, Night Sweats, Weight Gain and Weight Loss. Skin Not Present- Eczema, Hives, Itching, Lesions and Rash. HEENT Not Present- Dentures, Double Vision, Headache, Hearing Loss, Tinnitus and Visual Loss. Respiratory Not Present- Allergies, Chronic Cough, Coughing up blood, Shortness of breath at rest and Shortness of breath with exertion. Cardiovascular Not Present- Chest Pain, Difficulty Breathing Lying Down, Murmur, Palpitations, Racing/skipping heartbeats and Swelling. Gastrointestinal Not Present- Abdominal Pain, Bloody Stool, Constipation, Diarrhea, Difficulty Swallowing, Heartburn, Jaundice, Loss of  appetitie, Nausea and Vomiting. Female Genitourinary Not Present- Blood in Urine, Discharge, Flank Pain, Incontinence, Painful Urination, Urgency, Urinary frequency, Urinary Retention, Urinating at Night and Weak urinary stream. Musculoskeletal Not Present-  Back Pain, Joint Pain, Joint Swelling, Morning Stiffness, Muscle Pain, Muscle Weakness and Spasms. Neurological Not Present- Blackout spells, Difficulty with balance, Dizziness, Paralysis, Tremor and Weakness. Psychiatric Not Present- Insomnia.   Vitals  Weight: 141 lb Height: 62in Weight was reported by patient. Height was reported by patient. Body Surface Area: 1.65 m Body Mass Index: 25.79 kg/m  BP: 138/86 (Sitting, Right Arm, Standard)   Physical Exam General Mental Status -Alert, cooperative and good historian. General Appearance-pleasant, Not in acute distress. Orientation-Oriented X3. Build & Nutrition-Well nourished and Well developed.  Head and Neck Head-normocephalic, atraumatic . Neck Global Assessment - supple, no bruit auscultated on the right, no bruit auscultated on the left.  Eye Pupil - Bilateral-Regular and Round. Motion - Bilateral-EOMI.  Chest and Lung Exam Auscultation Breath sounds - clear at anterior chest wall and clear at posterior chest wall. Adventitious sounds - No Adventitious sounds.  Cardiovascular Auscultation Rhythm - Regular rate and rhythm. Heart Sounds - S1 WNL and S2 WNL. Murmurs & Other Heart Sounds - Auscultation of the heart reveals - No Murmurs.  Abdomen Palpation/Percussion Tenderness - Abdomen is non-tender to palpation. Rigidity (guarding) - Abdomen is soft. Auscultation Auscultation of the abdomen reveals - Bowel sounds normal.  Female Genitourinary Note: Not done, not pertinent to present illness   Musculoskeletal Note: On exam, she is alert and oriented, in no apparent distress. Her right knee shows no effusion today. She does have a small  Baker's cyst. Range is about 5 to 125. Moderate crepitus on range of motion. Slight tenderness medial greater than lateral with no instability.   Assessment & Plan Primary localized osteoarthritis of left knee (M17.12) Note:Surgical Plans: Left Total Knee Replacement  Disposition: Home  PCP: Dr. Dagmar Hait - Patient has been seen preoperatively and felt to be stable for surgery. Cards: Dr. Wynonia Lawman - Patient has been seen preoperatively and felt to be stable for surgery.  IV Topical TXA  Arlee Muslim, PA-C

## 2014-06-27 NOTE — Anesthesia Preprocedure Evaluation (Addendum)
Anesthesia Evaluation  Patient identified by MRN, date of birth, ID band Patient awake    Reviewed: Allergy & Precautions, NPO status , Patient's Chart, lab work & pertinent test results  History of Anesthesia Complications (+) PONV and history of anesthetic complications  Airway Mallampati: II  TM Distance: >3 FB Neck ROM: Full    Dental  (+) Teeth Intact, Dental Advisory Given   Pulmonary neg pulmonary ROS,    Pulmonary exam normal       Cardiovascular hypertension, Pt. on medications Normal cardiovascular exam    Neuro/Psych PSYCHIATRIC DISORDERS Anxiety negative neurological ROS     GI/Hepatic negative GI ROS, Neg liver ROS,   Endo/Other  negative endocrine ROS  Renal/GU negative Renal ROS     Musculoskeletal  (+) Arthritis -,   Abdominal   Peds  Hematology negative hematology ROS (+)   Anesthesia Other Findings   Reproductive/Obstetrics                           Anesthesia Physical Anesthesia Plan  ASA: II  Anesthesia Plan: MAC and Spinal   Post-op Pain Management:    Induction: Intravenous  Airway Management Planned: Simple Face Mask  Additional Equipment:   Intra-op Plan:   Post-operative Plan:   Informed Consent: I have reviewed the patients History and Physical, chart, labs and discussed the procedure including the risks, benefits and alternatives for the proposed anesthesia with the patient or authorized representative who has indicated his/her understanding and acceptance.   Dental advisory given  Plan Discussed with: CRNA, Anesthesiologist and Surgeon  Anesthesia Plan Comments:        Anesthesia Quick Evaluation

## 2014-06-28 ENCOUNTER — Inpatient Hospital Stay (HOSPITAL_COMMUNITY): Payer: BLUE CROSS/BLUE SHIELD | Admitting: Anesthesiology

## 2014-06-28 ENCOUNTER — Encounter (HOSPITAL_COMMUNITY)
Admission: RE | Disposition: A | Discharge status: Home Health | Payer: Self-pay | Source: Ambulatory Visit | Attending: Orthopedic Surgery

## 2014-06-28 ENCOUNTER — Encounter (HOSPITAL_COMMUNITY): Payer: Self-pay | Admitting: *Deleted

## 2014-06-28 ENCOUNTER — Inpatient Hospital Stay (HOSPITAL_COMMUNITY)
Admission: RE | Admit: 2014-06-28 | Discharge: 2014-06-30 | DRG: 470 | Disposition: A | Discharge status: Home Health | Payer: BLUE CROSS/BLUE SHIELD | Source: Ambulatory Visit | Attending: Orthopedic Surgery | Admitting: Orthopedic Surgery

## 2014-06-28 DIAGNOSIS — E780 Pure hypercholesterolemia: Secondary | ICD-10-CM | POA: Diagnosis present

## 2014-06-28 DIAGNOSIS — E785 Hyperlipidemia, unspecified: Secondary | ICD-10-CM | POA: Diagnosis present

## 2014-06-28 DIAGNOSIS — F419 Anxiety disorder, unspecified: Secondary | ICD-10-CM | POA: Diagnosis present

## 2014-06-28 DIAGNOSIS — I10 Essential (primary) hypertension: Secondary | ICD-10-CM | POA: Diagnosis present

## 2014-06-28 DIAGNOSIS — M179 Osteoarthritis of knee, unspecified: Secondary | ICD-10-CM | POA: Diagnosis present

## 2014-06-28 DIAGNOSIS — M1712 Unilateral primary osteoarthritis, left knee: Secondary | ICD-10-CM | POA: Diagnosis present

## 2014-06-28 DIAGNOSIS — M25562 Pain in left knee: Secondary | ICD-10-CM | POA: Diagnosis present

## 2014-06-28 DIAGNOSIS — Z01812 Encounter for preprocedural laboratory examination: Secondary | ICD-10-CM | POA: Diagnosis not present

## 2014-06-28 DIAGNOSIS — M1710 Unilateral primary osteoarthritis, unspecified knee: Secondary | ICD-10-CM | POA: Diagnosis present

## 2014-06-28 HISTORY — PX: TOTAL KNEE ARTHROPLASTY: SHX125

## 2014-06-28 LAB — ABO/RH: ABO/RH(D): O POS

## 2014-06-28 LAB — TYPE AND SCREEN
ABO/RH(D): O POS
ANTIBODY SCREEN: NEGATIVE

## 2014-06-28 SURGERY — ARTHROPLASTY, KNEE, TOTAL
Anesthesia: Monitor Anesthesia Care | Site: Knee | Laterality: Left

## 2014-06-28 MED ORDER — ONDANSETRON HCL 4 MG/2ML IJ SOLN
INTRAMUSCULAR | Status: DC | PRN
  Administered 2014-06-28: 4 mg via INTRAVENOUS

## 2014-06-28 MED ORDER — CEFAZOLIN SODIUM-DEXTROSE 2-3 GM-% IV SOLR
INTRAVENOUS | Status: AC
  Filled 2014-06-28: qty 50

## 2014-06-28 MED ORDER — SCOPOLAMINE 1 MG/3DAYS TD PT72
MEDICATED_PATCH | TRANSDERMAL | Status: AC
Start: 2014-06-28 — End: 2014-06-28
  Filled 2014-06-28: qty 1

## 2014-06-28 MED ORDER — KCL IN DEXTROSE-NACL 20-5-0.9 MEQ/L-%-% IV SOLN
INTRAVENOUS | Status: DC
  Administered 2014-06-28 – 2014-06-29 (×2): via INTRAVENOUS
  Filled 2014-06-28 (×3): qty 1000

## 2014-06-28 MED ORDER — RIVAROXABAN 10 MG PO TABS
10.0000 mg | ORAL_TABLET | Freq: Every day | ORAL | Status: DC
  Administered 2014-06-29 – 2014-06-30 (×2): 10 mg via ORAL
  Filled 2014-06-28 (×3): qty 1

## 2014-06-28 MED ORDER — POLYETHYLENE GLYCOL 3350 17 G PO PACK: 17.0000 g | PACK | Freq: Every day | ORAL | Status: DC | PRN

## 2014-06-28 MED ORDER — METOCLOPRAMIDE HCL 10 MG PO TABS: 5.0000 mg | ORAL_TABLET | Freq: Three times a day (TID) | ORAL | Status: DC | PRN

## 2014-06-28 MED ORDER — BUPIVACAINE HCL (PF) 0.25 % IJ SOLN
INTRAMUSCULAR | Status: AC
  Filled 2014-06-28: qty 30

## 2014-06-28 MED ORDER — DEXAMETHASONE SODIUM PHOSPHATE 10 MG/ML IJ SOLN
10.0000 mg | Freq: Once | INTRAMUSCULAR | Status: AC
  Administered 2014-06-28: 10 mg via INTRAVENOUS

## 2014-06-28 MED ORDER — ATORVASTATIN CALCIUM 40 MG PO TABS
40.0000 mg | ORAL_TABLET | Freq: Every day | ORAL | Status: DC
  Administered 2014-06-29 – 2014-06-30 (×2): 40 mg via ORAL
  Filled 2014-06-28 (×2): qty 1

## 2014-06-28 MED ORDER — AMLODIPINE BESYLATE 2.5 MG PO TABS
2.5000 mg | ORAL_TABLET | Freq: Every day | ORAL | Status: DC
  Administered 2014-06-29 – 2014-06-30 (×2): 2.5 mg via ORAL
  Filled 2014-06-28 (×2): qty 1

## 2014-06-28 MED ORDER — MENTHOL 3 MG MT LOZG: 1.0000 | LOZENGE | OROMUCOSAL | Status: DC | PRN

## 2014-06-28 MED ORDER — HYDROMORPHONE HCL 1 MG/ML IJ SOLN: 0.2500 mg | INTRAMUSCULAR | Status: DC | PRN

## 2014-06-28 MED ORDER — CEFAZOLIN SODIUM-DEXTROSE 2-3 GM-% IV SOLR
2.0000 g | Freq: Four times a day (QID) | INTRAVENOUS | Status: AC
  Administered 2014-06-28 (×2): 2 g via INTRAVENOUS
  Filled 2014-06-28 (×2): qty 50

## 2014-06-28 MED ORDER — DEXAMETHASONE SODIUM PHOSPHATE 10 MG/ML IJ SOLN
INTRAMUSCULAR | Status: AC
  Filled 2014-06-28: qty 1

## 2014-06-28 MED ORDER — PROPOFOL 10 MG/ML IV BOLUS
INTRAVENOUS | Status: AC
  Filled 2014-06-28: qty 20

## 2014-06-28 MED ORDER — ESTRADIOL 10 MCG VA TABS
1.0000 | ORAL_TABLET | VAGINAL | Status: DC
  Filled 2014-06-28: qty 1

## 2014-06-28 MED ORDER — ACETAMINOPHEN 325 MG PO TABS: 650.0000 mg | ORAL_TABLET | Freq: Four times a day (QID) | ORAL | Status: DC | PRN

## 2014-06-28 MED ORDER — SODIUM CHLORIDE 0.9 % IJ SOLN
INTRAMUSCULAR | Status: AC
  Filled 2014-06-28: qty 50

## 2014-06-28 MED ORDER — KETOROLAC TROMETHAMINE 15 MG/ML IJ SOLN: 7.5000 mg | Freq: Four times a day (QID) | INTRAMUSCULAR | Status: AC | PRN

## 2014-06-28 MED ORDER — ONDANSETRON HCL 4 MG PO TABS: 4.0000 mg | ORAL_TABLET | Freq: Four times a day (QID) | ORAL | Status: DC | PRN

## 2014-06-28 MED ORDER — BUPIVACAINE HCL 0.25 % IJ SOLN
INTRAMUSCULAR | Status: DC | PRN
Start: 2014-06-28 — End: 2014-06-28
  Administered 2014-06-28: 30 mL

## 2014-06-28 MED ORDER — BUPIVACAINE LIPOSOME 1.3 % IJ SUSP
INTRAMUSCULAR | Status: DC | PRN
  Administered 2014-06-28: 20 mL

## 2014-06-28 MED ORDER — BISACODYL 10 MG RE SUPP: 10.0000 mg | Freq: Every day | RECTAL | Status: DC | PRN

## 2014-06-28 MED ORDER — DEXAMETHASONE SODIUM PHOSPHATE 10 MG/ML IJ SOLN
10.0000 mg | Freq: Once | INTRAMUSCULAR | Status: AC
  Administered 2014-06-29: 10 mg via INTRAVENOUS
  Filled 2014-06-28: qty 1

## 2014-06-28 MED ORDER — ONDANSETRON HCL 4 MG/2ML IJ SOLN: 4.0000 mg | Freq: Four times a day (QID) | INTRAMUSCULAR | Status: DC | PRN

## 2014-06-28 MED ORDER — BUPIVACAINE LIPOSOME 1.3 % IJ SUSP
20.0000 mL | Freq: Once | INTRAMUSCULAR | Status: DC
  Filled 2014-06-28: qty 20

## 2014-06-28 MED ORDER — METHOCARBAMOL 500 MG PO TABS
500.0000 mg | ORAL_TABLET | Freq: Four times a day (QID) | ORAL | Status: DC | PRN
  Administered 2014-06-28 – 2014-06-30 (×5): 500 mg via ORAL
  Filled 2014-06-28 (×5): qty 1

## 2014-06-28 MED ORDER — DIPHENHYDRAMINE HCL 12.5 MG/5ML PO ELIX: 12.5000 mg | ORAL_SOLUTION | ORAL | Status: DC | PRN

## 2014-06-28 MED ORDER — MORPHINE SULFATE 2 MG/ML IJ SOLN: 1.0000 mg | INTRAMUSCULAR | Status: DC | PRN

## 2014-06-28 MED ORDER — TRAMADOL HCL 50 MG PO TABS
50.0000 mg | ORAL_TABLET | Freq: Four times a day (QID) | ORAL | Status: DC | PRN
  Administered 2014-06-29 (×2): 50 mg via ORAL
  Administered 2014-06-30: 100 mg via ORAL
  Filled 2014-06-28 (×2): qty 1
  Filled 2014-06-28: qty 2

## 2014-06-28 MED ORDER — PHENOL 1.4 % MT LIQD: 1.0000 | OROMUCOSAL | Status: DC | PRN

## 2014-06-28 MED ORDER — PROPOFOL 10 MG/ML IV BOLUS
INTRAVENOUS | Status: DC | PRN
  Administered 2014-06-28: 20 mg via INTRAVENOUS
  Administered 2014-06-28: 30 mg via INTRAVENOUS

## 2014-06-28 MED ORDER — LACTATED RINGERS IV SOLN
INTRAVENOUS | Status: DC | PRN
  Administered 2014-06-28 (×2): via INTRAVENOUS

## 2014-06-28 MED ORDER — BUPROPION HCL ER (XL) 300 MG PO TB24
300.0000 mg | ORAL_TABLET | Freq: Every day | ORAL | Status: DC
Start: 2014-06-29 — End: 2014-06-30
  Administered 2014-06-29 – 2014-06-30 (×2): 300 mg via ORAL
  Filled 2014-06-28 (×2): qty 1

## 2014-06-28 MED ORDER — SODIUM CHLORIDE 0.9 % IV SOLN: INTRAVENOUS | Status: DC

## 2014-06-28 MED ORDER — METOCLOPRAMIDE HCL 5 MG/ML IJ SOLN: 5.0000 mg | Freq: Three times a day (TID) | INTRAMUSCULAR | Status: DC | PRN

## 2014-06-28 MED ORDER — METHOCARBAMOL 1000 MG/10ML IJ SOLN
500.0000 mg | Freq: Four times a day (QID) | INTRAVENOUS | Status: DC | PRN
  Administered 2014-06-28: 500 mg via INTRAVENOUS
  Filled 2014-06-28 (×2): qty 5

## 2014-06-28 MED ORDER — CHLORHEXIDINE GLUCONATE 4 % EX LIQD: 60.0000 mL | Freq: Once | CUTANEOUS | Status: DC

## 2014-06-28 MED ORDER — ACETAMINOPHEN 500 MG PO TABS
1000.0000 mg | ORAL_TABLET | Freq: Four times a day (QID) | ORAL | Status: AC
  Administered 2014-06-28 – 2014-06-29 (×4): 1000 mg via ORAL
  Filled 2014-06-28 (×5): qty 2

## 2014-06-28 MED ORDER — SODIUM CHLORIDE 0.9 % IJ SOLN
INTRAMUSCULAR | Status: DC | PRN
Start: 2014-06-28 — End: 2014-06-28
  Administered 2014-06-28: 30 mL

## 2014-06-28 MED ORDER — TRANEXAMIC ACID 1000 MG/10ML IV SOLN
1000.0000 mg | INTRAVENOUS | Status: AC
  Administered 2014-06-28: 1000 mg via INTRAVENOUS
  Filled 2014-06-28: qty 10

## 2014-06-28 MED ORDER — ACETAMINOPHEN 650 MG RE SUPP
650.0000 mg | Freq: Four times a day (QID) | RECTAL | Status: DC | PRN
Start: 2014-06-29 — End: 2014-06-30

## 2014-06-28 MED ORDER — FENTANYL CITRATE (PF) 100 MCG/2ML IJ SOLN
INTRAMUSCULAR | Status: AC
  Filled 2014-06-28: qty 2

## 2014-06-28 MED ORDER — MIDAZOLAM HCL 2 MG/2ML IJ SOLN
INTRAMUSCULAR | Status: AC
  Filled 2014-06-28: qty 2

## 2014-06-28 MED ORDER — ACETAMINOPHEN 10 MG/ML IV SOLN
1000.0000 mg | Freq: Once | INTRAVENOUS | Status: AC
  Administered 2014-06-28: 1000 mg via INTRAVENOUS
  Filled 2014-06-28: qty 100

## 2014-06-28 MED ORDER — SCOPOLAMINE 1 MG/3DAYS TD PT72
MEDICATED_PATCH | TRANSDERMAL | Status: DC | PRN
  Administered 2014-06-28: 1 via TRANSDERMAL

## 2014-06-28 MED ORDER — TRIAMTERENE-HCTZ 37.5-25 MG PO TABS
0.5000 | ORAL_TABLET | Freq: Every day | ORAL | Status: DC
  Administered 2014-06-29 – 2014-06-30 (×2): 0.5 via ORAL
  Filled 2014-06-28 (×3): qty 0.5

## 2014-06-28 MED ORDER — DOCUSATE SODIUM 100 MG PO CAPS
100.0000 mg | ORAL_CAPSULE | Freq: Two times a day (BID) | ORAL | Status: DC
  Administered 2014-06-29 – 2014-06-30 (×3): 100 mg via ORAL

## 2014-06-28 MED ORDER — OXYCODONE HCL 5 MG PO TABS
5.0000 mg | ORAL_TABLET | ORAL | Status: DC | PRN
  Administered 2014-06-28: 5 mg via ORAL
  Administered 2014-06-28 (×2): 10 mg via ORAL
  Administered 2014-06-28 – 2014-06-29 (×4): 5 mg via ORAL
  Administered 2014-06-29: 10 mg via ORAL
  Administered 2014-06-30: 5 mg via ORAL
  Filled 2014-06-28: qty 1
  Filled 2014-06-28 (×2): qty 2
  Filled 2014-06-28: qty 1
  Filled 2014-06-28: qty 2
  Filled 2014-06-28 (×2): qty 1
  Filled 2014-06-28: qty 2
  Filled 2014-06-28: qty 1

## 2014-06-28 MED ORDER — PROMETHAZINE HCL 25 MG/ML IJ SOLN: 6.2500 mg | INTRAMUSCULAR | Status: DC | PRN

## 2014-06-28 MED ORDER — FLEET ENEMA 7-19 GM/118ML RE ENEM: 1.0000 | ENEMA | Freq: Once | RECTAL | Status: AC | PRN

## 2014-06-28 MED ORDER — FENTANYL CITRATE (PF) 100 MCG/2ML IJ SOLN
INTRAMUSCULAR | Status: DC | PRN
  Administered 2014-06-28: 100 ug via INTRAVENOUS

## 2014-06-28 MED ORDER — MIDAZOLAM HCL 5 MG/5ML IJ SOLN
INTRAMUSCULAR | Status: DC | PRN
  Administered 2014-06-28: 2 mg via INTRAVENOUS

## 2014-06-28 MED ORDER — CEFAZOLIN SODIUM-DEXTROSE 2-3 GM-% IV SOLR
2.0000 g | INTRAVENOUS | Status: AC
  Administered 2014-06-28: 2 g via INTRAVENOUS

## 2014-06-28 MED ORDER — PROPOFOL INFUSION 10 MG/ML OPTIME
INTRAVENOUS | Status: DC | PRN
  Administered 2014-06-28: 140 ug/kg/min via INTRAVENOUS

## 2014-06-28 SURGICAL SUPPLY — 60 items
BAG DECANTER FOR FLEXI CONT (MISCELLANEOUS) IMPLANT
BAG ZIPLOCK 12X15 (MISCELLANEOUS) IMPLANT
BANDAGE ELASTIC 6 VELCRO ST LF (GAUZE/BANDAGES/DRESSINGS) ×2 IMPLANT
BANDAGE ESMARK 6X9 LF (GAUZE/BANDAGES/DRESSINGS) ×1 IMPLANT
BLADE SAG 18X100X1.27 (BLADE) ×2 IMPLANT
BLADE SAW SGTL 11.0X1.19X90.0M (BLADE) ×2 IMPLANT
BNDG ESMARK 6X9 LF (GAUZE/BANDAGES/DRESSINGS) ×2
BOWL SMART MIX CTS (DISPOSABLE) ×2 IMPLANT
CAPT KNEE TOTAL 3 ATTUNE ×2 IMPLANT
CEMENT HV SMART SET (Cement) ×4 IMPLANT
CUFF TOURN SGL QUICK 34 (TOURNIQUET CUFF) ×1
CUFF TRNQT CYL 34X4X40X1 (TOURNIQUET CUFF) ×1 IMPLANT
DECANTER SPIKE VIAL GLASS SM (MISCELLANEOUS) ×2 IMPLANT
DRAPE EXTREMITY T 121X128X90 (DRAPE) ×2 IMPLANT
DRAPE POUCH INSTRU U-SHP 10X18 (DRAPES) ×2 IMPLANT
DRAPE U-SHAPE 47X51 STRL (DRAPES) ×2 IMPLANT
DRSG ADAPTIC 3X8 NADH LF (GAUZE/BANDAGES/DRESSINGS) ×2 IMPLANT
DRSG PAD ABDOMINAL 8X10 ST (GAUZE/BANDAGES/DRESSINGS) ×2 IMPLANT
DURAPREP 26ML APPLICATOR (WOUND CARE) ×2 IMPLANT
ELECT REM PT RETURN 9FT ADLT (ELECTROSURGICAL) ×2
ELECTRODE REM PT RTRN 9FT ADLT (ELECTROSURGICAL) ×1 IMPLANT
EVACUATOR 1/8 PVC DRAIN (DRAIN) ×2 IMPLANT
FACESHIELD WRAPAROUND (MASK) ×10 IMPLANT
GAUZE SPONGE 4X4 12PLY STRL (GAUZE/BANDAGES/DRESSINGS) ×2 IMPLANT
GLOVE BIO SURGEON STRL SZ7.5 (GLOVE) IMPLANT
GLOVE BIO SURGEON STRL SZ8 (GLOVE) ×4 IMPLANT
GLOVE BIOGEL PI IND STRL 6.5 (GLOVE) IMPLANT
GLOVE BIOGEL PI IND STRL 8 (GLOVE) ×1 IMPLANT
GLOVE BIOGEL PI INDICATOR 6.5 (GLOVE)
GLOVE BIOGEL PI INDICATOR 8 (GLOVE) ×1
GLOVE SURG SS PI 6.5 STRL IVOR (GLOVE) ×2 IMPLANT
GOWN STRL REUS W/TWL LRG LVL3 (GOWN DISPOSABLE) ×2 IMPLANT
GOWN STRL REUS W/TWL XL LVL3 (GOWN DISPOSABLE) IMPLANT
HANDPIECE INTERPULSE COAX TIP (DISPOSABLE) ×1
IMMOBILIZER KNEE 20 (SOFTGOODS) ×2
IMMOBILIZER KNEE 20 THIGH 36 (SOFTGOODS) ×1 IMPLANT
KIT BASIN OR (CUSTOM PROCEDURE TRAY) ×2 IMPLANT
MANIFOLD NEPTUNE II (INSTRUMENTS) ×2 IMPLANT
NDL SAFETY ECLIPSE 18X1.5 (NEEDLE) ×2 IMPLANT
NEEDLE HYPO 18GX1.5 SHARP (NEEDLE) ×2
NS IRRIG 1000ML POUR BTL (IV SOLUTION) ×2 IMPLANT
PACK TOTAL JOINT (CUSTOM PROCEDURE TRAY) ×2 IMPLANT
PADDING CAST COTTON 6X4 STRL (CAST SUPPLIES) ×2 IMPLANT
PEN SKIN MARKING BROAD (MISCELLANEOUS) ×2 IMPLANT
POSITIONER SURGICAL ARM (MISCELLANEOUS) ×2 IMPLANT
SET HNDPC FAN SPRY TIP SCT (DISPOSABLE) ×1 IMPLANT
STRIP CLOSURE SKIN 1/2X4 (GAUZE/BANDAGES/DRESSINGS) ×2 IMPLANT
SUCTION FRAZIER 12FR DISP (SUCTIONS) ×2 IMPLANT
SUT MNCRL AB 4-0 PS2 18 (SUTURE) ×2 IMPLANT
SUT VIC AB 2-0 CT1 27 (SUTURE) ×3
SUT VIC AB 2-0 CT1 TAPERPNT 27 (SUTURE) ×3 IMPLANT
SUT VLOC 180 0 24IN GS25 (SUTURE) ×2 IMPLANT
SYR 20CC LL (SYRINGE) ×2 IMPLANT
SYR 50ML LL SCALE MARK (SYRINGE) ×2 IMPLANT
TOWEL OR 17X26 10 PK STRL BLUE (TOWEL DISPOSABLE) ×2 IMPLANT
TOWEL OR NON WOVEN STRL DISP B (DISPOSABLE) IMPLANT
TRAY FOLEY W/METER SILVER 14FR (SET/KITS/TRAYS/PACK) ×2 IMPLANT
WATER STERILE IRR 1500ML POUR (IV SOLUTION) ×2 IMPLANT
WRAP KNEE MAXI GEL POST OP (GAUZE/BANDAGES/DRESSINGS) ×2 IMPLANT
YANKAUER SUCT BULB TIP 10FT TU (MISCELLANEOUS) ×2 IMPLANT

## 2014-06-28 NOTE — Interval H&P Note (Signed)
History and Physical Interval Note:  06/28/2014 6:56 AM  Danielle Hayes  has presented today for surgery, with the diagnosis of left knee osteoarthritis  The various methods of treatment have been discussed with the patient and family. After consideration of risks, benefits and other options for treatment, the patient has consented to  Procedure(s): LEFT TOTAL KNEE ARTHROPLASTY (Left) as a surgical intervention .  The patient's history has been reviewed, patient examined, no change in status, stable for surgery.  I have reviewed the patient's chart and labs.  Questions were answered to the patient's satisfaction.     Gearlean Alf

## 2014-06-28 NOTE — Addendum Note (Signed)
Addendum  created 06/28/14 1015 by Lissa Morales, CRNA   Modules edited: Anesthesia Blocks and Procedures, Clinical Notes   Clinical Notes:  File: [id number]

## 2014-06-28 NOTE — Op Note (Signed)
Pre-operative diagnosis- Osteoarthritis  Left knee(s)  Post-operative diagnosis- Osteoarthritis Left knee(s)  Procedure-  Left  Total Knee Arthroplasty  Surgeon- Dione Plover. Jax Abdelrahman, MD  Assistant- Ardeen Jourdain, PA-C   Anesthesia-  Spinal  EBL-* No blood loss amount entered *   Drains Hemovac  Tourniquet time- 32 minutes @ 254 mm Hg  Complications- None  Condition-PACU - hemodynamically stable.   Brief Clinical Note  Danielle Hayes is a 64 y.o. year old female with end stage OA of her left knee with progressively worsening pain and dysfunction. She has constant pain, with activity and at rest and significant functional deficits with difficulties even with ADLs. She has had extensive non-op management including analgesics, injections of cortisone and viscosupplements, and home exercise program, but remains in significant pain with significant dysfunction. Radiographs show bone on bone arthritis medial and patellofemoral. She presents now for left Total Knee Arthroplasty.    Procedure in detail---   The patient is brought into the operating room and positioned supine on the operating table. After successful administration of  Spinal,   a tourniquet is placed high on the  Left thigh(s) and the lower extremity is prepped and draped in the usual sterile fashion. Time out is performed by the operating team and then the  Left lower extremity is wrapped in Esmarch, knee flexed and the tourniquet inflated to 300 mmHg.       A midline incision is made with a ten blade through the subcutaneous tissue to the level of the extensor mechanism. A fresh blade is used to make a medial parapatellar arthrotomy. Soft tissue over the proximal medial tibia is subperiosteally elevated to the joint line with a knife and into the semimembranosus bursa with a Cobb elevator. Soft tissue over the proximal lateral tibia is elevated with attention being paid to avoiding the patellar tendon on the tibial tubercle. The  patella is everted, knee flexed 90 degrees and the ACL and PCL are removed. Findings are bone on bone medial and patellofemoral with large global osteophytes.        The drill is used to create a starting hole in the distal femur and the canal is thoroughly irrigated with sterile saline to remove the fatty contents. The 5 degree Left  valgus alignment guide is placed into the femoral canal and the distal femoral cutting block is pinned to remove 10 mm off the distal femur. Resection is made with an oscillating saw.      The tibia is subluxed forward and the menisci are removed. The extramedullary alignment guide is placed referencing proximally at the medial aspect of the tibial tubercle and distally along the second metatarsal axis and tibial crest. The block is pinned to remove 41mm off the more deficient medial  side. Resection is made with an oscillating saw. Size 5is the most appropriate size for the tibia and the proximal tibia is prepared with the modular drill and keel punch for that size.      The femoral sizing guide is placed and size 5 is most appropriate. Rotation is marked off the epicondylar axis and confirmed by creating a rectangular flexion gap at 90 degrees. The size 5 cutting block is pinned in this rotation and the anterior, posterior and chamfer cuts are made with the oscillating saw. The intercondylar block is then placed and that cut is made.      Trial size 5 tibial component, trial size 5 posterior stabilized femur and a 6  mm posterior stabilized  rotating platform insert trial is placed. Full extension is achieved with excellent varus/valgus and anterior/posterior balance throughout full range of motion. The patella is everted and thickness measured to be 21  mm. Free hand resection is taken to 12 mm, a 35 template is placed, lug holes are drilled, trial patella is placed, and it tracks normally. Osteophytes are removed off the posterior femur with the trial in place. All trials are  removed and the cut bone surfaces prepared with pulsatile lavage. Cement is mixed and once ready for implantation, the size 5 tibial implant, size  5 posterior stabilized femoral component, and the size 35 patella are cemented in place and the patella is held with the clamp. The trial insert is placed and the knee held in full extension. The Exparel (20 ml mixed with 30 ml saline) and .25% Bupivicaine, are injected into the extensor mechanism, posterior capsule, medial and lateral gutters and subcutaneous tissues.  All extruded cement is removed and once the cement is hard the permanent 6 mm posterior stabilized rotating platform insert is placed into the tibial tray.      The wound is copiously irrigated with saline solution and the extensor mechanism closed over a hemovac drain with #1 V-loc suture. The tourniquet is released for a total tourniquet time of 32  minutes. Flexion against gravity is 140 degrees and the patella tracks normally. Subcutaneous tissue is closed with 2.0 vicryl and subcuticular with running 4.0 Monocryl. The incision is cleaned and dried and steri-strips and a bulky sterile dressing are applied. The limb is placed into a knee immobilizer and the patient is awakened and transported to recovery in stable condition.      Please note that a surgical assistant was a medical necessity for this procedure in order to perform it in a safe and expeditious manner. Surgical assistant was necessary to retract the ligaments and vital neurovascular structures to prevent injury to them and also necessary for proper positioning of the limb to allow for anatomic placement of the prosthesis.   Dione Plover Erma Joubert, MD    06/28/2014, 8:09 AM

## 2014-06-28 NOTE — Evaluation (Signed)
Physical Therapy Evaluation Patient Details Name: Danielle Hayes MRN: [id number] DOB: 1950/07/12 Today's Date: 06/28/2014   History of Present Illness  LTKA  Clinical Impression  Patient tolerated ambulating x 50'. Patient will benefit from PT  To address problems listed in note below.    Follow Up Recommendations Supervision/Assistance - 24 hour    Equipment Recommendations  None recommended by PT    Recommendations for Other Services       Precautions / Restrictions Precautions Precautions: Knee Required Braces or Orthoses: Knee Immobilizer - Left Knee Immobilizer - Left: Discontinue once straight leg raise with < 10 degree lag      Mobility  Bed Mobility Overal bed mobility: Needs Assistance Bed Mobility: Supine to Sit     Supine to sit: Min assist     General bed mobility comments: support L leg, instruction in use of R leg to support L  Transfers Overall transfer level: Needs assistance Equipment used: Rolling walker (2 wheeled) Transfers: Sit to/from Stand Sit to Stand: Min assist         General transfer comment: cues for hand and L leg position,  Ambulation/Gait Ambulation/Gait assistance: Min assist Ambulation Distance (Feet): 50 Feet Assistive device: Rolling walker (2 wheeled) Gait Pattern/deviations: Step-to pattern;Step-through pattern;Antalgic;Decreased stance time - left     General Gait Details: cues for sequence  Stairs            Wheelchair Mobility    Modified Rankin (Stroke Patients Only)       Balance                                             Pertinent Vitals/Pain Pain Assessment: 0-10 Pain Score: 5  Pain Location: L knee and thigh Pain Descriptors / Indicators: Aching;Sore;Tightness Pain Intervention(s): Monitored during session;Premedicated before session;Ice applied    Home Living Family/patient expects to be discharged to:: Private residence Living Arrangements: Spouse/significant  other Available Help at Discharge: Family Type of Home: House Home Access: Stairs to enter Entrance Stairs-Rails: None Entrance Stairs-Number of Steps: 2 Home Layout: One level Home Equipment: Environmental consultant - 2 wheels;Bedside commode      Prior Function Level of Independence: Independent               Hand Dominance        Extremity/Trunk Assessment               Lower Extremity Assessment: LLE deficits/detail   LLE Deficits / Details: unable to perform SLR  Cervical / Trunk Assessment: Normal  Communication   Communication: No difficulties  Cognition Arousal/Alertness: Awake/alert Behavior During Therapy: WFL for tasks assessed/performed Overall Cognitive Status: Within Functional Limits for tasks assessed                      General Comments      Exercises Total Joint Exercises Ankle Circles/Pumps: AROM;Both;10 reps;Supine Quad Sets: 10 reps;Both;AROM;Supine      Assessment/Plan    PT Assessment Patient needs continued PT services  PT Diagnosis Abnormality of gait;Difficulty walking;Acute pain   PT Problem List Decreased strength;Decreased range of motion;Decreased activity tolerance;Decreased mobility;Decreased knowledge of precautions;Decreased safety awareness;Decreased knowledge of use of DME;Pain  PT Treatment Interventions DME instruction;Gait training;Stair training;Functional mobility training;Therapeutic activities;Therapeutic exercise;Patient/family education   PT Goals (Current goals can be found in the Care Plan section) Acute Rehab PT Goals  Patient Stated Goal: to get better and have the R one PT Goal Formulation: With patient/family Time For Goal Achievement: 07/05/14 Potential to Achieve Goals: Good    Frequency 7X/week   Barriers to discharge        Co-evaluation               End of Session Equipment Utilized During Treatment: Left knee immobilizer Activity Tolerance: Patient tolerated treatment well;Patient  limited by fatigue Patient left: with call bell/phone within reach;in chair;with family/visitor present Nurse Communication: Mobility status         Time: 5170-0174 PT Time Calculation (min) (ACUTE ONLY): 32 min   Charges:   PT Evaluation $Initial PT Evaluation Tier I: 1 Procedure PT Treatments $Gait Training: 8-22 mins   PT G Codes:        [name] 06/28/2014, 4:49 PM [name] PT [phone]

## 2014-06-28 NOTE — Progress Notes (Signed)
Utilization review completed.  

## 2014-06-28 NOTE — Progress Notes (Signed)
Patient  Has had a dry cough and a little tightness in chest. She will talk to anesth in holding room

## 2014-06-28 NOTE — H&P (View-Only) (Signed)
Danielle Hayes DOB: Dec 17, 1950 Married / Language: English / Race: White Female Date of Admission:  06/28/2014 CC:  Left knee pain History of Present Illness The patient is a 64 year old female who comes in for a preoperative History and Physical. The patient is scheduled for a left total knee arthroplasty to be performed by Dr. Dione Plover. Aluisio, MD at [facility] on 06-28-2014.  The patient is a 64 year old female who presented for follow up of their knee. The patient is being followed for their bilateral knee pain and osteoarthritis. They have undegone cortisone injection in the right knee and a series of Synvisc in the left knee. Symptoms reported include: pain (in the left knee). The patient feels that they are doing poorly and report their pain level to be mild to moderate (in the left). The following medication has been used for pain control: Diclofenac.  Her left knee is what is giving more trouble. She has had viscosupplements and cortisone yet still has pain. She is ready to proceed with knee replacement at this time. Risks and benefits of the surgery have been discussed with the patient and they elect to proceed with surgery.  There are on active contraindications to upcoming procedure such as ongoing infection or progressive neurological disease.  Problem List/Past Medical Primary localized osteoarthritis of left knee (M17.12) Osteoarthritis Hypercholesterolemia Vertigo Hypertension Benign Positional Vertigo Anxiety Disorder  Allergies Synvisc *MUSCULOSKELETAL THERAPY AGENTS* local reaction  Family History Cancer brother Congestive Heart Failure mother Hypertension First Degree Relatives. brother Osteoarthritis mother Heart Disease mother and grandfather mothers side  Social History  Drug/Alcohol Rehab (Previously) no Drug/Alcohol Rehab (Currently) no Children 0 Alcohol use current drinker; drinks wine; less than 5 per week Exercise Exercises  daily; does running / walking, individual sport and gym / weights Current work status retired Number of flights of stairs before winded 2-3 Tobacco / smoke exposure no Pain Contract no Illicit drug use no Marital status married Living situation live with spouse Tobacco use Never smoker. never smoker Advance Directives Living Will, Healthcare POA  Medication History Lisinopril (10MG  Tablet, Oral) Active. BuPROPion HCl ER (XL) (300MG  Tablet ER 24HR, Oral) Active. Atorvastatin Calcium (80MG  Tablet, Oral) Active. Triamterene-HCTZ (37.5-25MG  Tablet, Oral) Active. Diclofenac Sodium (50MG  Tablet DR, Oral) Active. Pennsaid (1.5% Solution, Transdermal) Active. Prometrium (200MG  Capsule, Oral) Active. (every 3 months for 12 days) Vagifem (10MCG Tablet, Vaginal) Active. (2x week) Divigel (1MG /GM Gel, Transdermal) Active. Multiple Vitamin (Oral) Active. Vitamin D (Oral) Specific dose unknown - Active. Omega 3 (Oral) Specific dose unknown - Active. Cal-Mag Aspartate (Oral) Specific dose unknown - Active. Glucosamine Sulfate (Oral) Specific dose unknown - Active. Aspirin Adult Low Strength (81MG  Tablet DR, Oral) Active.   Past Surgical History Arthroscopy of Knee bilateral Tonsillectomy   Review of Systems General Not Present- Chills, Fatigue, Fever, Memory Loss, Night Sweats, Weight Gain and Weight Loss. Skin Not Present- Eczema, Hives, Itching, Lesions and Rash. HEENT Not Present- Dentures, Double Vision, Headache, Hearing Loss, Tinnitus and Visual Loss. Respiratory Not Present- Allergies, Chronic Cough, Coughing up blood, Shortness of breath at rest and Shortness of breath with exertion. Cardiovascular Not Present- Chest Pain, Difficulty Breathing Lying Down, Murmur, Palpitations, Racing/skipping heartbeats and Swelling. Gastrointestinal Not Present- Abdominal Pain, Bloody Stool, Constipation, Diarrhea, Difficulty Swallowing, Heartburn, Jaundice, Loss of  appetitie, Nausea and Vomiting. Female Genitourinary Not Present- Blood in Urine, Discharge, Flank Pain, Incontinence, Painful Urination, Urgency, Urinary frequency, Urinary Retention, Urinating at Night and Weak urinary stream. Musculoskeletal Not Present-  Back Pain, Joint Pain, Joint Swelling, Morning Stiffness, Muscle Pain, Muscle Weakness and Spasms. Neurological Not Present- Blackout spells, Difficulty with balance, Dizziness, Paralysis, Tremor and Weakness. Psychiatric Not Present- Insomnia.   Vitals  Weight: 141 lb Height: 62in Weight was reported by patient. Height was reported by patient. Body Surface Area: 1.65 m Body Mass Index: 25.79 kg/m  BP: 138/86 (Sitting, Right Arm, Standard)   Physical Exam General Mental Status -Alert, cooperative and good historian. General Appearance-pleasant, Not in acute distress. Orientation-Oriented X3. Build & Nutrition-Well nourished and Well developed.  Head and Neck Head-normocephalic, atraumatic . Neck Global Assessment - supple, no bruit auscultated on the right, no bruit auscultated on the left.  Eye Pupil - Bilateral-Regular and Round. Motion - Bilateral-EOMI.  Chest and Lung Exam Auscultation Breath sounds - clear at anterior chest wall and clear at posterior chest wall. Adventitious sounds - No Adventitious sounds.  Cardiovascular Auscultation Rhythm - Regular rate and rhythm. Heart Sounds - S1 WNL and S2 WNL. Murmurs & Other Heart Sounds - Auscultation of the heart reveals - No Murmurs.  Abdomen Palpation/Percussion Tenderness - Abdomen is non-tender to palpation. Rigidity (guarding) - Abdomen is soft. Auscultation Auscultation of the abdomen reveals - Bowel sounds normal.  Female Genitourinary Note: Not done, not pertinent to present illness   Musculoskeletal Note: On exam, she is alert and oriented, in no apparent distress. Her right knee shows no effusion today. She does have a small  Baker's cyst. Range is about 5 to 125. Moderate crepitus on range of motion. Slight tenderness medial greater than lateral with no instability.   Assessment & Plan Primary localized osteoarthritis of left knee (M17.12) Note:Surgical Plans: Left Total Knee Replacement  Disposition: Home  PCP: Dr. Dagmar Hait - Patient has been seen preoperatively and felt to be stable for surgery. Cards: Dr. Wynonia Lawman - Patient has been seen preoperatively and felt to be stable for surgery.  IV Topical TXA  Arlee Muslim, PA-C

## 2014-06-28 NOTE — Transfer of Care (Signed)
Immediate Anesthesia Transfer of Care Note  Patient: Danielle Hayes  Procedure(s) Performed: Procedure(s): LEFT TOTAL KNEE ARTHROPLASTY (Left)  Patient Location: PACU  Anesthesia Type:Spinal  Level of Consciousness: awake, alert , oriented and patient cooperative  Airway & Oxygen Therapy: Patient Spontanous Breathing and Patient connected to face mask oxygen  Post-op Assessment: Report given to RN and Post -op Vital signs reviewed and stable  Post vital signs: Reviewed and stable  Last Vitals:  Filed Vitals:   06/28/14 0559  BP: 157/79  Pulse:   Temp:   Resp:     Complications: No apparent anesthesia complications

## 2014-06-28 NOTE — Anesthesia Procedure Notes (Addendum)
Spinal Patient location during procedure: OR End time: 06/28/2014 7:12 AM Staffing Performed by: anesthesiologist  Preanesthetic Checklist Completed: patient identified, site marked, surgical consent, pre-op evaluation, timeout performed, IV checked, risks and benefits discussed and monitors and equipment checked Spinal Block Patient position: sitting Prep: Betadine Patient monitoring: heart rate, continuous pulse ox and blood pressure Location: L3-4 Injection technique: single-shot Needle Needle type: Sprotte  Needle gauge: 24 G Needle length: 9 cm Assessment Sensory level: T4 Additional Notes Expiration date of kit checked and confirmed. Patient tolerated procedure well, without complications. Negative heme  Or paresthesia. CSF x3   right paramedial a ppoach. J Evans CRNA performed spinal  

## 2014-06-28 NOTE — Anesthesia Postprocedure Evaluation (Signed)
Anesthesia Post Note  Patient: Danielle Hayes  Procedure(s) Performed: Procedure(s) (LRB): LEFT TOTAL KNEE ARTHROPLASTY (Left)  Anesthesia type: MAC  Patient location: PACU  Post pain: Pain level controlled  Post assessment: Patient's Cardiovascular Status Stable  Last Vitals:  Filed Vitals:   06/28/14 0930  BP: 127/76  Pulse: 59  Temp: 36.6 C  Resp: 17    Post vital signs: Reviewed and stable  Level of consciousness: sedated  Complications: No apparent anesthesia complications

## 2014-06-29 ENCOUNTER — Encounter (HOSPITAL_COMMUNITY): Payer: Self-pay | Admitting: Orthopedic Surgery

## 2014-06-29 LAB — BASIC METABOLIC PANEL
Anion gap: 5 (ref 5–15)
BUN: 12 mg/dL (ref 6–20)
CHLORIDE: 104 mmol/L (ref 101–111)
CO2: 29 mmol/L (ref 22–32)
Calcium: 8 mg/dL — ABNORMAL LOW (ref 8.9–10.3)
Creatinine, Ser: 0.58 mg/dL (ref 0.44–1.00)
GFR calc Af Amer: >60 mL/min (ref >60)
GFR calc non Af Amer: >60 mL/min (ref >60)
GLUCOSE: 160 mg/dL — AB (ref 65–99)
POTASSIUM: 4.1 mmol/L (ref 3.5–5.1)
SODIUM: 138 mmol/L (ref 135–145)

## 2014-06-29 LAB — CBC
HCT: 35.4 % — ABNORMAL LOW (ref 36.0–46.0)
HEMOGLOBIN: 12.5 g/dL (ref 12.0–15.0)
MCH: 32.1 pg (ref 26.0–34.0)
MCHC: 35.3 g/dL (ref 30.0–36.0)
MCV: 91 fL (ref 78.0–100.0)
Platelets: 201 10*3/uL (ref 150–400)
RBC: 3.89 MIL/uL (ref 3.87–5.11)
RDW: 13.3 % (ref 11.5–15.5)
WBC: 11.6 10*3/uL — AB (ref 4.0–10.5)

## 2014-06-29 MED ORDER — RIVAROXABAN 10 MG PO TABS: 10.0000 mg | ORAL_TABLET | Freq: Every day | ORAL | Status: DC

## 2014-06-29 MED ORDER — ALUM & MAG HYDROXIDE-SIMETH 200-200-20 MG/5ML PO SUSP
30.0000 mL | ORAL | Status: DC | PRN
  Administered 2014-06-29: 30 mL via ORAL
  Filled 2014-06-29: qty 30

## 2014-06-29 MED ORDER — OXYCODONE HCL 5 MG PO TABS: 5.0000 mg | ORAL_TABLET | ORAL | Status: DC | PRN

## 2014-06-29 MED ORDER — TRAMADOL HCL 50 MG PO TABS: 50.0000 mg | ORAL_TABLET | Freq: Four times a day (QID) | ORAL | Status: DC | PRN

## 2014-06-29 MED ORDER — METHOCARBAMOL 500 MG PO TABS: 500.0000 mg | ORAL_TABLET | Freq: Four times a day (QID) | ORAL | Status: DC | PRN

## 2014-06-29 NOTE — Evaluation (Signed)
Occupational Therapy Evaluation Patient Details Name: Danielle Hayes MRN: [id number] DOB: 1950-05-29 Today's Date: 06/29/2014    History of Present Illness LTKA   Clinical Impression   Pt requires frequent verbal cues for safety with walker including proper walker distance to self, safety with turns and not lifting the walker off the floor. Will follow for acute OT to progress ADL independence and safety.     Follow Up Recommendations  No OT follow up;Supervision/Assistance - 24 hour    Equipment Recommendations  None recommended by OT    Recommendations for Other Services       Precautions / Restrictions Precautions Precautions: Knee Required Braces or Orthoses: Knee Immobilizer - Left Knee Immobilizer - Left: Discontinue once straight leg raise with < 10 degree lag Restrictions Weight Bearing Restrictions: No      Mobility Bed Mobility               General bed mobility comments: in chair.   Transfers Overall transfer level: Needs assistance Equipment used: Rolling walker (2 wheeled) Transfers: Sit to/from Stand Sit to Stand: Min assist         General transfer comment: cues for hand placement.    Balance                                            ADL Overall ADL's : Needs assistance/impaired Eating/Feeding: Independent;Sitting   Grooming: Wash/dry hands;Minimal assistance;Standing   Upper Body Bathing: Set up;Sitting   Lower Body Bathing: Minimal assistance;Sit to/from stand   Upper Body Dressing : Set up;Sitting   Lower Body Dressing: Moderate assistance;Sit to/from stand   Toilet Transfer: Minimal assistance;Ambulation;BSC;RW   Toileting- Clothing Manipulation and Hygiene: Minimal assistance;Sit to/from stand;Moderate assistance         General ADL Comments: Pt educated on 3in1 option and how to adjust for over the commode. Pt plans to have husband obtain a shower chair but did educate that 3in1 can be used as a  shower chair. Pt can reach all the way to L foot for dressing but did explain uses of reacher for ADL. Pt needs frequent cues for walker safety and hand placement.      Vision     Perception     Praxis      Pertinent Vitals/Pain Pain Assessment: 0-10 Pain Score: 9  Pain Location: at start of session. down to 7 during session Pain Descriptors / Indicators: Aching Pain Intervention(s): Repositioned;Ice applied     Hand Dominance     Extremity/Trunk Assessment Upper Extremity Assessment Upper Extremity Assessment: Overall WFL for tasks assessed           Communication Communication Communication: No difficulties   Cognition Arousal/Alertness: Awake/alert Behavior During Therapy: WFL for tasks assessed/performed Overall Cognitive Status: Within Functional Limits for tasks assessed                     General Comments       Exercises       Shoulder Instructions      Home Living Family/patient expects to be discharged to:: Private residence Living Arrangements: Spouse/significant other Available Help at Discharge: Family Type of Home: House Home Access: Stairs to enter Technical brewer of Steps: 2 Entrance Stairs-Rails: None Home Layout: One level     Bathroom Shower/Tub: Occupational psychologist: Standard     Home  Equipment: Gilford Rile - 2 wheels;Bedside commode;Adaptive equipment Adaptive Equipment: Reacher Additional Comments: pt states husband planning to purchase a shower chair.       Prior Functioning/Environment Level of Independence: Independent             OT Diagnosis: Generalized weakness   OT Problem List: Decreased strength;Decreased knowledge of use of DME or AE   OT Treatment/Interventions: Self-care/ADL training;Patient/family education;Therapeutic activities;DME and/or AE instruction    OT Goals(Current goals can be found in the care plan section) Acute Rehab OT Goals Patient Stated Goal: to be more  independent. OT Goal Formulation: With patient Time For Goal Achievement: 07/06/14 Potential to Achieve Goals: Good  OT Frequency: Min 2X/week   Barriers to D/C:            Co-evaluation              End of Session Equipment Utilized During Treatment: Gait belt;Rolling walker;Left knee immobilizer CPM Left Knee CPM Left Knee: Off  Activity Tolerance: Patient tolerated treatment well Patient left: in chair;with call bell/phone within reach   Time: 0936-1005 OT Time Calculation (min): 29 min Charges:  OT General Charges $OT Visit: 1 Procedure OT Evaluation $Initial OT Evaluation Tier I: 1 Procedure OT Treatments $Therapeutic Activity: 8-22 mins G-Codes:    Jules Schick  [phone] 06/29/2014, 11:21 AM

## 2014-06-29 NOTE — Discharge Instructions (Addendum)
° °Dr. Frank Aluisio °Total Joint Specialist °[facility] °[address] °[place], [place] [address] °[phone] ° °TOTAL KNEE REPLACEMENT POSTOPERATIVE DIRECTIONS ° °Knee Rehabilitation, Guidelines Following Surgery  °Results after knee surgery are often greatly improved when you follow the exercise, range of motion and muscle strengthening exercises prescribed by your doctor. Safety measures are also important to protect the knee from further injury. Any time any of these exercises cause you to have increased pain or swelling in your knee joint, decrease the amount until you are comfortable again and slowly increase them. If you have problems or questions, call your caregiver or physical therapist for advice.  ° °HOME CARE INSTRUCTIONS  °Remove items at home which could result in a fall. This includes throw rugs or furniture in walking pathways.  °· ICE to the affected knee every three hours for 30 minutes at a time and then as needed for pain and swelling.  Continue to use ice on the knee for pain and swelling from surgery. You may notice swelling that will progress down to the foot and ankle.  This is normal after surgery.  Elevate the leg when you are not up walking on it.   °· Continue to use the breathing machine which will help keep your temperature down.  It is common for your temperature to cycle up and down following surgery, especially at night when you are not up moving around and exerting yourself.  The breathing machine keeps your lungs expanded and your temperature down. °· Do not place pillow under knee, focus on keeping the knee straight while resting ° °DIET °You may resume your previous home diet once your are discharged from the hospital. ° °DRESSING / WOUND CARE / SHOWERING °You may shower 3 days after surgery, but keep the wounds dry during showering.  You may use an occlusive plastic wrap (Press'n Seal for example), NO SOAKING/SUBMERGING IN THE BATHTUB.  If the  bandage gets wet, change with a clean dry gauze.  If the incision gets wet, pat the wound dry with a clean towel. °You may start showering once you are discharged home but do not submerge the incision under water. Just pat the incision dry and apply a dry gauze dressing on daily. °Change the surgical dressing daily and reapply a dry dressing each time. ° °ACTIVITY °Walk with your walker as instructed. °Use walker as long as suggested by your caregivers. °Avoid periods of inactivity such as sitting longer than an hour when not asleep. This helps prevent blood clots.  °You may resume a sexual relationship in one month or when given the OK by your doctor.  °You may return to work once you are cleared by your doctor.  °Do not drive a car for 6 weeks or until released by you surgeon.  °Do not drive while taking narcotics. ° °WEIGHT BEARING °Weight bearing as tolerated with assist device (walker, cane, etc) as directed, use it as long as suggested by your surgeon or therapist, typically at least 4-6 weeks. ° °POSTOPERATIVE CONSTIPATION PROTOCOL °Constipation - defined medically as fewer than three stools per week and severe constipation as less than one stool per week. ° °One of the most common issues patients have following surgery is constipation.  Even if you have a regular bowel pattern at home, your normal regimen is likely to be disrupted due to multiple reasons following surgery.  Combination of anesthesia, postoperative narcotics, change in appetite and fluid intake all can affect your bowels.    In order to avoid complications following surgery, here are some recommendations in order to help you during your recovery period. ° °Colace (docusate) - Pick up an over-the-counter form of Colace or another stool softener and take twice a day as long as you are requiring postoperative pain medications.  Take with a full glass of water daily.  If you experience loose stools or diarrhea, hold the colace until you stool forms  back up.  If your symptoms do not get better within 1 week or if they get worse, check with your doctor. ° °Dulcolax (bisacodyl) - Pick up over-the-counter and take as directed by the product packaging as needed to assist with the movement of your bowels.  Take with a full glass of water.  Use this product as needed if not relieved by Colace only.  ° °MiraLax (polyethylene glycol) - Pick up over-the-counter to have on hand.  MiraLax is a solution that will increase the amount of water in your bowels to assist with bowel movements.  Take as directed and can mix with a glass of water, juice, soda, coffee, or tea.  Take if you go more than two days without a movement. °Do not use MiraLax more than once per day. Call your doctor if you are still constipated or irregular after using this medication for 7 days in a row. ° °If you continue to have problems with postoperative constipation, please contact the office for further assistance and recommendations.  If you experience "the worst abdominal pain ever" or develop nausea or vomiting, please contact the office immediatly for further recommendations for treatment. ° °ITCHING ° If you experience itching with your medications, try taking only a single pain pill, or even half a pain pill at a time.  You can also use Benadryl over the counter for itching or also to help with sleep.  ° °TED HOSE STOCKINGS °Wear the elastic stockings on both legs for three weeks following surgery during the day but you may remove then at night for sleeping. ° °MEDICATIONS °See your medication summary on the “After Visit Summary” that the nursing staff will review with you prior to discharge.  You may have some home medications which will be placed on hold until you complete the course of blood thinner medication.  It is important for you to complete the blood thinner medication as prescribed by your surgeon.  Continue your approved medications as instructed at time of  discharge. ° °PRECAUTIONS °If you experience chest pain or shortness of breath - call 911 immediately for transfer to the hospital emergency department.  °If you develop a fever greater that 101 F, purulent drainage from wound, increased redness or drainage from wound, foul odor from the wound/dressing, or calf pain - CONTACT YOUR SURGEON.   °                                                °FOLLOW-UP APPOINTMENTS °Make sure you keep all of your appointments after your operation with your surgeon and caregivers. You should call the office at the above phone number and make an appointment for approximately two weeks after the date of your surgery or on the date instructed by your surgeon outlined in the "After Visit Summary". ° ° °RANGE OF MOTION AND STRENGTHENING EXERCISES  °Rehabilitation of the knee is important following a knee injury or   an operation. After just a few days of immobilization, the muscles of the thigh which control the knee become weakened and shrink (atrophy). Knee exercises are designed to build up the tone and strength of the thigh muscles and to improve knee motion. Often times heat used for twenty to thirty minutes before working out will loosen up your tissues and help with improving the range of motion but do not use heat for the first two weeks following surgery. These exercises can be done on a training (exercise) mat, on the floor, on a table or on a bed. Use what ever works the best and is most comfortable for you Knee exercises include:  °Leg Lifts - While your knee is still immobilized in a splint or cast, you can do straight leg raises. Lift the leg to 60 degrees, hold for 3 sec, and slowly lower the leg. Repeat 10-20 times 2-3 times daily. Perform this exercise against resistance later as your knee gets better.  °Quad and Hamstring Sets - Tighten up the muscle on the front of the thigh (Quad) and hold for 5-10 sec. Repeat this 10-20 times hourly. Hamstring sets are done by pushing the  foot backward against an object and holding for 5-10 sec. Repeat as with quad sets.  °· Leg Slides: Lying on your back, slowly slide your foot toward your buttocks, bending your knee up off the floor (only go as far as is comfortable). Then slowly slide your foot back down until your leg is flat on the floor again. °· Angel Wings: Lying on your back spread your legs to the side as far apart as you can without causing discomfort.  °A rehabilitation program following serious knee injuries can speed recovery and prevent re-injury in the future due to weakened muscles. Contact your doctor or a physical therapist for more information on knee rehabilitation.  ° °IF YOU ARE TRANSFERRED TO A SKILLED REHAB FACILITY °If the patient is transferred to a skilled rehab facility following release from the hospital, a list of the current medications will be sent to the facility for the patient to continue.  When discharged from the skilled rehab facility, please have the facility set up the patient's Home Health Physical Therapy prior to being released. Also, the skilled facility will be responsible for providing the patient with their medications at time of release from the facility to include their pain medication, the muscle relaxants, and their blood thinner medication. If the patient is still at the rehab facility at time of the two week follow up appointment, the skilled rehab facility will also need to assist the patient in arranging follow up appointment in our office and any transportation needs. ° °MAKE SURE YOU:  °Understand these instructions.  °Get help right away if you are not doing well or get worse.  ° ° °Pick up stool softner and laxative for home use following surgery while on pain medications. °Do not submerge incision under water. °Please use good hand washing techniques while changing dressing each day. °May shower starting three days after surgery. °Please use a clean towel to pat the incision dry following  showers. °Continue to use ice for pain and swelling after surgery. °Do not use any lotions or creams on the incision until instructed by your surgeon. ° °Take Xarelto for two and a half more weeks, then discontinue Xarelto. °Once the patient has completed the Xarelto, they may resume the 81 mg Aspirin. ° °Information on my medicine - XARELTO® (Rivaroxaban) ° °This   medication education was reviewed with me or my healthcare representative as part of my discharge preparation.  The pharmacist that spoke with me during my hospital stay was:  Elsie Baynes A, RPH  Why was Xarelto prescribed for you? Xarelto was prescribed for you to reduce the risk of blood clots forming after orthopedic surgery. The medical term for these abnormal blood clots is venous thromboembolism (VTE).  What do you need to know about xarelto ? Take your Xarelto ONCE DAILY at the same time every day. You may take it either with or without food.  If you have difficulty swallowing the tablet whole, you may crush it and mix in applesauce just prior to taking your dose.  Take Xarelto exactly as prescribed by your doctor and DO NOT stop taking Xarelto without talking to the doctor who prescribed the medication.  Stopping without other VTE prevention medication to take the place of Xarelto may increase your risk of developing a clot.  After discharge, you should have regular check-up appointments with your healthcare provider that is prescribing your Xarelto.    What do you do if you miss a dose? If you miss a dose, take it as soon as you remember on the same day then continue your regularly scheduled once daily regimen the next day. Do not take two doses of Xarelto on the same day.   Important Safety Information A possible side effect of Xarelto is bleeding. You should call your healthcare provider right away if you experience any of the following: ? Bleeding from an injury or your nose that does not stop. ? Unusual colored  urine (red or dark brown) or unusual colored stools (red or black). ? Unusual bruising for unknown reasons. ? A serious fall or if you hit your head (even if there is no bleeding).  Some medicines may interact with Xarelto and might increase your risk of bleeding while on Xarelto. To help avoid this, consult your healthcare provider or pharmacist prior to using any new prescription or non-prescription medications, including herbals, vitamins, non-steroidal anti-inflammatory drugs (NSAIDs) and supplements.  This website has more information on Xarelto: [email or web address]

## 2014-06-29 NOTE — Discharge Summary (Signed)
Physician Discharge Summary   Patient ID: Danielle Hayes MRN: [id number] DOB/AGE: 08-14-1950 64 y.o.  Admit date: 06/28/2014 Discharge date: 06-30-2014  Primary Diagnosis:  Osteoarthritis Left knee(s)  Admission Diagnoses:  Past Medical History  Diagnosis Date  . Hypertension   . Hyperlipidemia   . Arthritis   . Complication of anesthesia     vomiting from anything  . PONV (postoperative nausea and vomiting)   . Dysrhythmia     PVC's  . Anxiety    Discharge Diagnoses:   Principal Problem:   OA (osteoarthritis) of knee  Estimated body mass index is 24.87 kg/(m^2) as calculated from the following:   Height as of this encounter: _0  (1.575 m).   Weight as of this encounter: 61.689 kg (136 lb).  Procedure:  Procedure(s) (LRB): LEFT TOTAL KNEE ARTHROPLASTY (Left)   Consults: None  HPI: Danielle Hayes is a 64 y.o. year old female with end stage OA of her left knee with progressively worsening pain and dysfunction. She has constant pain, with activity and at rest and significant functional deficits with difficulties even with ADLs. She has had extensive non-op management including analgesics, injections of cortisone and viscosupplements, and home exercise program, but remains in significant pain with significant dysfunction. Radiographs show bone on bone arthritis medial and patellofemoral. She presents now for left Total Knee Arthroplasty.   Laboratory Data: Admission on 06/28/2014  Component Date Value Ref Range Status  . ABO/RH(D) 06/28/2014 O POS   Final  . Antibody Screen 06/28/2014 NEG   Final  . Sample Expiration 06/28/2014 07/01/2014   Final  . ABO/RH(D) 06/28/2014 O POS   Final  . WBC 06/29/2014 11.6* 4.0 - 10.5 K/uL Final  . RBC 06/29/2014 3.89  3.87 - 5.11 MIL/uL Final  . Hemoglobin 06/29/2014 12.5  12.0 - 15.0 g/dL Final  . HCT 06/29/2014 35.4* 36.0 - 46.0 % Final  . MCV 06/29/2014 91.0  78.0 - 100.0 fL Final  . MCH 06/29/2014 32.1  26.0 - 34.0 pg Final  .  MCHC 06/29/2014 35.3  30.0 - 36.0 g/dL Final  . RDW 06/29/2014 13.3  11.5 - 15.5 % Final  . Platelets 06/29/2014 201  150 - 400 K/uL Final  . Sodium 06/29/2014 138  135 - 145 mmol/L Final  . Potassium 06/29/2014 4.1  3.5 - 5.1 mmol/L Final  . Chloride 06/29/2014 104  101 - 111 mmol/L Final  . CO2 06/29/2014 29  22 - 32 mmol/L Final  . Glucose, Bld 06/29/2014 160* 65 - 99 mg/dL Final  . BUN 06/29/2014 12  6 - 20 mg/dL Final  . Creatinine, Ser 06/29/2014 0.58  0.44 - 1.00 mg/dL Final  . Calcium 06/29/2014 8.0* 8.9 - 10.3 mg/dL Final  . GFR calc non Af Amer 06/29/2014 >60  >60 mL/min Final  . GFR calc Af Amer 06/29/2014 >60  >60 mL/min Final   Comment: (NOTE) The eGFR has been calculated using the CKD EPI equation. This calculation has not been validated in all clinical situations. eGFR's persistently <60 mL/min signify possible Chronic Kidney Disease.   Georgiann Hahn gap 06/29/2014 5  5 - 15 Final  Hospital Outpatient Visit on 06/22/2014  Component Date Value Ref Range Status  . MRSA, PCR 06/22/2014 NEGATIVE  NEGATIVE Final  . Staphylococcus aureus 06/22/2014 NEGATIVE  NEGATIVE Final   Comment:        The Xpert SA Assay (FDA approved for NASAL specimens in patients over 47 years of age), is one component of  a comprehensive surveillance program.  Test performance has been validated by [facility] for patients greater than or equal to 28 year old. It is not intended to diagnose infection nor to guide or monitor treatment.   Marland Kitchen aPTT 06/22/2014 30  24 - 37 seconds Final  . WBC 06/22/2014 4.8  4.0 - 10.5 K/uL Final  . RBC 06/22/2014 4.65  3.87 - 5.11 MIL/uL Final  . Hemoglobin 06/22/2014 15.3* 12.0 - 15.0 g/dL Final  . HCT 06/22/2014 42.5  36.0 - 46.0 % Final  . MCV 06/22/2014 91.4  78.0 - 100.0 fL Final  . MCH 06/22/2014 32.9  26.0 - 34.0 pg Final  . MCHC 06/22/2014 36.0  30.0 - 36.0 g/dL Final  . RDW 06/22/2014 13.2  11.5 - 15.5 % Final  . Platelets 06/22/2014 193  150 - 400 K/uL  Final  . Sodium 06/22/2014 138  135 - 145 mmol/L Final  . Potassium 06/22/2014 3.7  3.5 - 5.1 mmol/L Final  . Chloride 06/22/2014 102  101 - 111 mmol/L Final  . CO2 06/22/2014 29  22 - 32 mmol/L Final  . Glucose, Bld 06/22/2014 85  65 - 99 mg/dL Final  . BUN 06/22/2014 17  6 - 20 mg/dL Final  . Creatinine, Ser 06/22/2014 0.56  0.44 - 1.00 mg/dL Final  . Calcium 06/22/2014 9.0  8.9 - 10.3 mg/dL Final  . Total Protein 06/22/2014 6.8  6.5 - 8.1 g/dL Final  . Albumin 06/22/2014 4.0  3.5 - 5.0 g/dL Final  . AST 06/22/2014 30  15 - 41 U/L Final  . ALT 06/22/2014 30  14 - 54 U/L Final  . Alkaline Phosphatase 06/22/2014 78  38 - 126 U/L Final  . Total Bilirubin 06/22/2014 1.1  0.3 - 1.2 mg/dL Final  . GFR calc non Af Amer 06/22/2014 >60  >60 mL/min Final  . GFR calc Af Amer 06/22/2014 >60  >60 mL/min Final   Comment: (NOTE) The eGFR has been calculated using the CKD EPI equation. This calculation has not been validated in all clinical situations. eGFR's persistently <60 mL/min signify possible Chronic Kidney Disease.   . Anion gap 06/22/2014 7  5 - 15 Final  . Prothrombin Time 06/22/2014 13.7  11.6 - 15.2 seconds Final  . INR 06/22/2014 1.03  0.00 - 1.49 Final  . Color, Urine 06/22/2014 YELLOW  YELLOW Final  . APPearance 06/22/2014 CLEAR  CLEAR Final  . Specific Gravity, Urine 06/22/2014 1.002* 1.005 - 1.030 Final  . pH 06/22/2014 6.5  5.0 - 8.0 Final  . Glucose, UA 06/22/2014 NEGATIVE  NEGATIVE mg/dL Final  . Hgb urine dipstick 06/22/2014 NEGATIVE  NEGATIVE Final  . Bilirubin Urine 06/22/2014 NEGATIVE  NEGATIVE Final  . Ketones, ur 06/22/2014 NEGATIVE  NEGATIVE mg/dL Final  . Protein, ur 06/22/2014 NEGATIVE  NEGATIVE mg/dL Final  . Urobilinogen, UA 06/22/2014 0.2  0.0 - 1.0 mg/dL Final  . Nitrite 06/22/2014 NEGATIVE  NEGATIVE Final  . Leukocytes, UA 06/22/2014 NEGATIVE  NEGATIVE Final   MICROSCOPIC NOT DONE ON URINES WITH NEGATIVE PROTEIN, BLOOD, LEUKOCYTES, NITRITE, OR GLUCOSE <1000  mg/dL.     X-Rays:No results found.  EKG: Orders placed or performed during the hospital encounter of 06/22/14  . EKG 12-Lead  . EKG [facility] Course: LAKEA MITTELMAN is a 64 y.o. who was admitted to [facility]. They were brought to the operating room on 06/28/2014 and underwent Procedure(s): LEFT TOTAL KNEE ARTHROPLASTY.  Patient tolerated the procedure  well and was later transferred to the recovery room and then to the orthopaedic floor for postoperative care.  They were given PO and IV analgesics for pain control following their surgery.  They were given 24 hours of postoperative antibiotics of  Anti-infectives    Start     Dose/Rate Route Frequency Ordered Stop   06/28/14 1300  ceFAZolin (ANCEF) IVPB 2 g/50 mL premix     2 g 100 mL/hr over 30 Minutes Intravenous Every 6 hours 06/28/14 1053 06/28/14 2148   06/28/14 0514  ceFAZolin (ANCEF) IVPB 2 g/50 mL premix     2 g 100 mL/hr over 30 Minutes Intravenous On call to O.R. 06/28/14 0630 06/28/14 0706     and started on DVT prophylaxis in the form of Xarelto.   PT and OT were ordered for total joint protocol.  Discharge planning consulted to help with postop disposition and equipment needs.  Patient had a tough night on the evening of surgery but she did walk 50 feet that day.  They started to get up OOB with therapy again on day one. Hemovac drain was pulled without difficulty.  Continued to work with therapy into day two.  Dressing was changed on day two and the incision was healing well. Patient was seen in rounds and was ready to go home.  Discharge home with home health Diet - Cardiac diet Follow up - in 2 weeks Activity - WBAT Disposition - Home Condition Upon Discharge - Good D/C Meds - See DC Summary DVT Prophylaxis - Xarelto  Discharge Instructions    Call MD / Call 911    Complete by:  As directed   If you experience chest pain or shortness of breath, CALL 911 and be transported to the hospital emergency  room.  If you develope a fever above 101 F, pus (white drainage) or increased drainage or redness at the wound, or calf pain, call your surgeon's office.     Change dressing    Complete by:  As directed   Change dressing daily with sterile 4 x 4 inch gauze dressing and apply TED hose. Do not submerge the incision under water.     Constipation Prevention    Complete by:  As directed   Drink plenty of fluids.  Prune juice may be helpful.  You may use a stool softener, such as Colace (over the counter) 100 mg twice a day.  Use MiraLax (over the counter) for constipation as needed.     Diet - low sodium heart healthy    Complete by:  As directed      Discharge instructions    Complete by:  As directed   Pick up stool softner and laxative for home use following surgery while on pain medications. Do not submerge incision under water. Please use good hand washing techniques while changing dressing each day. May shower starting three days after surgery. Please use a clean towel to pat the incision dry following showers. Continue to use ice for pain and swelling after surgery. Do not use any lotions or creams on the incision until instructed by your surgeon.  Take Xarelto for two and a half more weeks, then discontinue Xarelto. Once the patient has completed the Xarelto, they may resume the 81 mg Aspirin.  Postoperative Constipation Protocol  Constipation - defined medically as fewer than three stools per week and severe constipation as less than one stool per week.  One of the most common issues patients have following surgery  is constipation.  Even if you have a regular bowel pattern at home, your normal regimen is likely to be disrupted due to multiple reasons following surgery.  Combination of anesthesia, postoperative narcotics, change in appetite and fluid intake all can affect your bowels.  In order to avoid complications following surgery, here are some recommendations in order to help you  during your recovery period.  Colace (docusate) - Pick up an over-the-counter form of Colace or another stool softener and take twice a day as long as you are requiring postoperative pain medications.  Take with a full glass of water daily.  If you experience loose stools or diarrhea, hold the colace until you stool forms back up.  If your symptoms do not get better within 1 week or if they get worse, check with your doctor.  Dulcolax (bisacodyl) - Pick up over-the-counter and take as directed by the product packaging as needed to assist with the movement of your bowels.  Take with a full glass of water.  Use this product as needed if not relieved by Colace only.   MiraLax (polyethylene glycol) - Pick up over-the-counter to have on hand.  MiraLax is a solution that will increase the amount of water in your bowels to assist with bowel movements.  Take as directed and can mix with a glass of water, juice, soda, coffee, or tea.  Take if you go more than two days without a movement. Do not use MiraLax more than once per day. Call your doctor if you are still constipated or irregular after using this medication for 7 days in a row.  If you continue to have problems with postoperative constipation, please contact the office for further assistance and recommendations.  If you experience "the worst abdominal pain ever" or develop nausea or vomiting, please contact the office immediatly for further recommendations for treatment.     Do not put a pillow under the knee. Place it under the heel.    Complete by:  As directed      Do not sit on low chairs, stoools or toilet seats, as it may be difficult to get up from low surfaces    Complete by:  As directed      Driving restrictions    Complete by:  As directed   No driving until released by the physician.     Increase activity slowly as tolerated    Complete by:  As directed      Lifting restrictions    Complete by:  As directed   No lifting until released  by the physician.     Patient may shower    Complete by:  As directed   You may shower without a dressing once there is no drainage.  Do not wash over the wound.  If drainage remains, do not shower until drainage stops.     TED hose    Complete by:  As directed   Use stockings (TED hose) for 3 weeks on both leg(s).  You may remove them at night for sleeping.     Weight bearing as tolerated    Complete by:  As directed   Laterality:  left  Extremity:  Lower            Medication List    STOP taking these medications        aspirin 81 MG tablet     diclofenac 50 MG EC tablet  Commonly known as:  VOLTAREN  DIVIGEL TD     Fish Oil 1200 MG Caps     Glucosamine Sulfate 1000 MG Tabs     multivitamin tablet     NON FORMULARY     PROBIOTIC DAILY PO     progesterone 200 MG capsule  Commonly known as:  PROMETRIUM     VAGIFEM 10 MCG Tabs vaginal tablet  Generic drug:  Estradiol     Vitamin D3 5000 UNITS Caps      TAKE these medications        amLODipine 2.5 MG tablet  Commonly known as:  NORVASC  Take 2.5 mg by mouth daily.     atorvastatin 40 MG tablet  Commonly known as:  LIPITOR  Take 40 mg by mouth daily.     buPROPion 300 MG 24 hr tablet  Commonly known as:  WELLBUTRIN XL  Take 300 mg by mouth daily.     FINACEA 15 % cream  Generic drug:  Azelaic Acid  Apply 1 application topically 2 (two) times daily.     methocarbamol 500 MG tablet  Commonly known as:  ROBAXIN  Take 1 tablet (500 mg total) by mouth every 6 (six) hours as needed for muscle spasms.     oxyCODONE 5 MG immediate release tablet  Commonly known as:  Oxy IR/ROXICODONE  Take 1-2 tablets (5-10 mg total) by mouth every 3 (three) hours as needed for moderate pain or severe pain.     rivaroxaban 10 MG Tabs tablet  Commonly known as:  XARELTO  Take 1 tablet (10 mg total) by mouth daily with breakfast. Take Xarelto for two and a half more weeks, then discontinue Xarelto. Once the patient has  completed the Xarelto, they may resume the 81 mg Aspirin.     traMADol 50 MG tablet  Commonly known as:  ULTRAM  Take 1-2 tablets (50-100 mg total) by mouth every 6 (six) hours as needed (mild pain).     triamterene-hydrochlorothiazide 37.5-25 MG per tablet  Commonly known as:  MAXZIDE-25  Take 0.5 tablets by mouth daily.           Follow-up Information    Follow up with Gearlean Alf, MD In 2 weeks.   Specialty:  Orthopedic Surgery   Why:  Call office at [phone] to setup appointment with Dr. Wynelle Link in two weeks from surgery.   Contact information:   [address] [place] [address] [phone]       Signed: Arlee Muslim, PA-C Orthopaedic Surgery 06/29/2014, 9:32 AM

## 2014-06-29 NOTE — Progress Notes (Signed)
   Subjective: 1 Day Post-Op Procedure(s) (LRB): LEFT TOTAL KNEE ARTHROPLASTY (Left) Patient reports pain as mild and moderate.   Patient seen in rounds with Dr. Wynelle Link. Tough night last night but better this morning. Patient is well, and has had no acute complaints or problems We will resume therapy today. She walked 50 feet the day of surgery. Wants to try and go home later today if meets all goals and does very well with therapy. Plan is to go Home after hospital stay.  Objective: Vital signs in last 24 hours: Temp:  [97.3 F (36.3 C)-98.3 F (36.8 C)] 97.8 F (36.6 C) (06/28 0631) Pulse Rate:  [58-73] 66 (06/28 0631) Resp:  [12-17] 16 (06/28 0631) BP: (108-135)/(66-98) 125/98 mmHg (06/28 0631) SpO2:  [96 %-100 %] 98 % (06/28 0631)  Intake/Output from previous day:  Intake/Output Summary (Last 24 hours) at 06/29/14 0922 Last data filed at 06/29/14 0806  Gross per 24 hour  Intake 2611.25 ml  Output   4598 ml  Net -1986.75 ml    Intake/Output this shift: Total I/O In: -  Out: 300 [Urine:300]  Labs:  Recent Labs  06/29/14 0408  HGB 12.5    Recent Labs  06/29/14 0408  WBC 11.6*  RBC 3.89  HCT 35.4*  PLT 201    Recent Labs  06/29/14 0408  NA 138  K 4.1  CL 104  CO2 29  BUN 12  CREATININE 0.58  GLUCOSE 160*  CALCIUM 8.0*   No results for input(s): LABPT, INR in the last 72 hours.  EXAM General - Patient is Alert, Appropriate and Oriented Extremity - Neurovascular intact Sensation intact distally Dorsiflexion/Plantar flexion intact Dressing - dressing C/D/I Motor Function - intact, moving foot and toes well on exam.  Hemovac pulled without difficulty.  Past Medical History  Diagnosis Date  . Hypertension   . Hyperlipidemia   . Arthritis   . Complication of anesthesia     vomiting from anything  . PONV (postoperative nausea and vomiting)   . Dysrhythmia     PVC's  . Anxiety     Assessment/Plan: 1 Day Post-Op Procedure(s)  (LRB): LEFT TOTAL KNEE ARTHROPLASTY (Left) Principal Problem:   OA (osteoarthritis) of knee  Estimated body mass index is 24.87 kg/(m^2) as calculated from the following:   Height as of this encounter: 5\' 2"  (1.575 m).   Weight as of this encounter: 61.689 kg (136 lb). Advance diet Up with therapy Discharge home with home health  DVT Prophylaxis - Xarelto Weight-Bearing as tolerated to left leg D/C O2 and Pulse OX and try on Room Air  If able to go home later today after therapy: Diet - Cardiac diet Follow up - in 2 weeks Activity - WBAT Disposition - Home Condition Upon Discharge - Pending results of therapy D/C Meds - See DC Summary DVT Prophylaxis - Xarelto  Arlee Muslim, PA-C Orthopaedic Surgery 06/29/2014, 9:22 AM

## 2014-06-29 NOTE — Progress Notes (Signed)
Physical Therapy Treatment Patient Details Name: DAZIA LIPPOLD MRN: [id number] DOB: 28-Jul-1950 Today's Date: 06/29/2014    History of Present Illness LTKA    PT Comments    Pt is  Very shakey, states   That "she was petting her cat  and realized it wasn't there". Patient is ambulating x 250'.   Follow Up Recommendations  Supervision/Assistance - 24 hour     Equipment Recommendations  None recommended by PT    Recommendations for Other Services       Precautions / Restrictions Precautions Precautions: Knee Required Braces or Orthoses: Knee Immobilizer - Left Knee Immobilizer - Left: Discontinue once straight leg raise with < 10 degree lag    Mobility  Bed Mobility Overal bed mobility: Needs Assistance Bed Mobility: Supine to Sit;Sit to Supine     Supine to sit: Min guard Sit to supine: Min guard   General bed mobility comments: patient requires frequent cues for safety, tends to move fast, does not follow clearly, very "nervous"  Transfers   Equipment used: Rolling walker (2 wheeled) Transfers: Sit to/from Stand Sit to Stand: Min assist         General transfer comment: cues for hand placement., cues for safety, patient is impulsive  Ambulation/Gait Ambulation/Gait assistance: Min assist Ambulation Distance (Feet): 250 Feet Assistive device: Rolling walker (2 wheeled) Gait Pattern/deviations: Step-to pattern;Step-through pattern;Antalgic     General Gait Details: cues for sequence, cues to keep hands on RW. removed KI for last 28' and patient did well.    Stairs            Wheelchair Mobility    Modified Rankin (Stroke Patients Only)       Balance                                    Cognition Arousal/Alertness: Awake/alert Behavior During Therapy: Anxious;Impulsive (tremulous.)                        Exercises Total Joint Exercises Ankle Circles/Pumps: AROM;Both;10 reps;Supine Quad Sets: 10  reps;Both;AROM;Supine Heel Slides: AAROM;Left;10 reps;Supine Hip ABduction/ADduction: AROM;Left;10 reps;Supine Straight Leg Raises: AAROM;Left;10 reps;Supine Goniometric ROM: 10-50 l knee    General Comments        Pertinent Vitals/Pain Pain Score: 7  Pain Location: L knee Pain Descriptors / Indicators: Aching;Tightness Pain Intervention(s): Monitored during session;Premedicated before session;Ice applied    Home Living                      Prior Function            PT Goals (current goals can now be found in the care plan section) Progress towards PT goals: Progressing toward goals    Frequency  7X/week    PT Plan Current plan remains appropriate    Co-evaluation             End of Session Equipment Utilized During Treatment: Left knee immobilizer Activity Tolerance: Patient tolerated treatment well Patient left: in bed;with call bell/phone within reach;with bed alarm set     Time: 1340-1400 PT Time Calculation (min) (ACUTE ONLY): 20 min  Charges:  $Gait Training: 8-22 mins $Therapeutic Exercise: 8-22 mins                    G Codes:      [name] 06/29/2014, 4:16 PM Santiago Glad  [place] PT [phone]

## 2014-06-29 NOTE — Care Management Note (Signed)
Case Management Note  Patient Details  Name: BREUNNA NORDMANN MRN: [id number] Date of Birth: Oct 26, 1950  Subjective/Objective:                   LEFT TOTAL KNEE ARTHROPLASTY (Left) Action/Plan: Discharge planning  Expected Discharge Date:  06/30/14               Expected Discharge Plan:  [place]  In-House Referral:     Discharge planning Services  CM Consult  Post Acute Care Choice:  Home Health Choice offered to:  Patient  DME Arranged:    DME Agency:     HH Arranged:    HH Agency:  [facility]  Status of Service:  Completed, signed off  Medicare Important Message Given:    Date Medicare IM Given:    Medicare IM give by:    Date Additional Medicare IM Given:    Additional Medicare Important Message give by:     If discussed at [place] of Stay Meetings, dates discussed:    Additional Comments: CM met with pt in room to offer choice of home health agency.  Pt chooses [facility] to render HHPT.  Pt has all DME needed at home from family member.  Address and contact information verified by pt.  Referral called to [employer], Tim.  NO other CM needs were communicated.   Dellie Catholic, RN 06/29/2014, 1:13 PM

## 2014-06-29 NOTE — Progress Notes (Signed)
Physical Therapy Treatment Patient Details Name: Danielle Hayes MRN: [id number] DOB: 16-Nov-1950 Today's Date: 06/29/2014    History of Present Illness LTKA    PT Comments    Patient ambulated well but increased pain with There. Exer. Plans Dc tomorrow. Patient appears a bit trmulous/shakey.  Follow Up Recommendations  Supervision/Assistance - 24 hour     Equipment Recommendations  None recommended by PT    Recommendations for Other Services       Precautions / Restrictions Precautions Precautions: Knee Required Braces or Orthoses: Knee Immobilizer - Left Knee Immobilizer - Left: Discontinue once straight leg raise with < 10 degree lag Restrictions Weight Bearing Restrictions: No    Mobility  Bed Mobility Overal bed mobility: Needs Assistance Bed Mobility: Sit to Supine       Sit to supine: Min assist   General bed mobility comments: used R foot to self assist but was impulsive and went fast.  Transfers Overall transfer level: Needs assistance Equipment used: Rolling walker (2 wheeled) Transfers: Sit to/from Stand Sit to Stand: Min assist         General transfer comment: cues for hand placement., cues for safety, patient is impulsive  Ambulation/Gait Ambulation/Gait assistance: Min assist Ambulation Distance (Feet): 180 Feet Assistive device: Rolling walker (2 wheeled) Gait Pattern/deviations: Step-to pattern;Antalgic;Step-through pattern     General Gait Details: cues for sequence   Stairs            Wheelchair Mobility    Modified Rankin (Stroke Patients Only)       Balance                                    Cognition Arousal/Alertness: Awake/alert Behavior During Therapy:  (patient is anxious, shaking, impulsive, unsteady.) Overall Cognitive Status: Within Functional Limits for tasks assessed                      Exercises Total Joint Exercises Ankle Circles/Pumps: AROM;Both;10 reps;Supine Quad Sets: 10  reps;Both;AROM;Supine Heel Slides: AAROM;Left;10 reps;Supine Straight Leg Raises: AAROM;Left;10 reps;Supine Goniometric ROM: 10-50 l knee    General Comments        Pertinent Vitals/Pain Pain Assessment: 0-10 Pain Score: 8  Pain Location: L knee Pain Descriptors / Indicators: Aching;Constant Pain Intervention(s): RN gave pain meds during session;Limited activity within patient's tolerance;Ice applied    Home Living Family/patient expects to be discharged to:: Private residence Living Arrangements: Spouse/significant other Available Help at Discharge: Family Type of Home: House Home Access: Stairs to enter Entrance Stairs-Rails: None Home Layout: One level Home Equipment: Environmental consultant - 2 wheels;Bedside commode;Adaptive equipment Additional Comments: pt states husband planning to purchase a shower chair.     Prior Function Level of Independence: Independent          PT Goals (current goals can now be found in the care plan section) Acute Rehab PT Goals Patient Stated Goal: to be more independent. Progress towards PT goals: Progressing toward goals    Frequency  7X/week    PT Plan Current plan remains appropriate    Co-evaluation             End of Session Equipment Utilized During Treatment: Left knee immobilizer Activity Tolerance: Patient limited by pain Patient left: in bed;with call bell/phone within reach     Time: 1140-1216 PT Time Calculation (min) (ACUTE ONLY): 36 min  Charges:  $Gait Training: 8-22 mins $Therapeutic Exercise:  8-22 mins                    G Codes:      [name] 06/29/2014, 1:14 PM

## 2014-06-30 LAB — CBC
HEMATOCRIT: 34.2 % — AB (ref 36.0–46.0)
Hemoglobin: 12.1 g/dL (ref 12.0–15.0)
MCH: 32.6 pg (ref 26.0–34.0)
MCHC: 35.4 g/dL (ref 30.0–36.0)
MCV: 92.2 fL (ref 78.0–100.0)
Platelets: 219 10*3/uL (ref 150–400)
RBC: 3.71 MIL/uL — ABNORMAL LOW (ref 3.87–5.11)
RDW: 13.4 % (ref 11.5–15.5)
WBC: 11.4 10*3/uL — AB (ref 4.0–10.5)

## 2014-06-30 LAB — BASIC METABOLIC PANEL
Anion gap: 6 (ref 5–15)
BUN: 12 mg/dL (ref 6–20)
CHLORIDE: 103 mmol/L (ref 101–111)
CO2: 30 mmol/L (ref 22–32)
CREATININE: 0.5 mg/dL (ref 0.44–1.00)
Calcium: 8.3 mg/dL — ABNORMAL LOW (ref 8.9–10.3)
GFR calc non Af Amer: >60 mL/min (ref >60)
Glucose, Bld: 112 mg/dL — ABNORMAL HIGH (ref 65–99)
Potassium: 4 mmol/L (ref 3.5–5.1)
Sodium: 139 mmol/L (ref 135–145)

## 2014-06-30 MED ORDER — METHOCARBAMOL 500 MG PO TABS: 500.0000 mg | ORAL_TABLET | Freq: Four times a day (QID) | ORAL | Status: DC | PRN

## 2014-06-30 MED ORDER — RIVAROXABAN 10 MG PO TABS: 10.0000 mg | ORAL_TABLET | Freq: Every day | ORAL | Status: DC

## 2014-06-30 MED ORDER — OXYCODONE HCL 5 MG PO TABS: 5.0000 mg | ORAL_TABLET | ORAL | Status: DC | PRN

## 2014-06-30 MED ORDER — BUPIVACAINE IN DEXTROSE 0.75-8.25 % IT SOLN
INTRATHECAL | Status: DC | PRN
  Administered 2014-06-28: 1.6 mL via INTRATHECAL

## 2014-06-30 MED ORDER — TRAMADOL HCL 50 MG PO TABS: 50.0000 mg | ORAL_TABLET | Freq: Four times a day (QID) | ORAL | Status: DC | PRN

## 2014-06-30 NOTE — Progress Notes (Signed)
Pt to d/c home with [place] home [facility]. No DME needs. AVS reviewed and "My Chart" discussed with pt. Pt capable of verbalizing medications, dressing changes, signs and symptoms of infection, and follow-up appointments. Remains hemodynamically stable. No signs and symptoms of distress. Educated pt to return to ER in the case of SOB, dizziness, or chest pain.

## 2014-06-30 NOTE — Progress Notes (Signed)
Occupational Therapy Treatment Patient Details Name: Danielle Hayes MRN: [id number] DOB: July 24, 1950 Today's Date: 06/30/2014    History of present illness LTKA   OT comments  Pt continues to be impulsive with activity and requires constant verbal cues for safety. Husband present. Recommend HHOT follow up to progress safety with ADL and also practice shower transfer. Do not feel pt is safe for showering yet. Pt and spouse aware and agree to let [facility] assess showering.    Follow Up Recommendations  Home health OT;Supervision/Assistance - 24 hour    Equipment Recommendations  None recommended by OT    Recommendations for Other Services      Precautions / Restrictions Precautions Precautions: Knee Restrictions Weight Bearing Restrictions: No       Mobility Bed Mobility Overal bed mobility: Needs Assistance Bed Mobility: Supine to Sit     Supine to sit: Min guard        Transfers Overall transfer level: Needs assistance Equipment used: Rolling walker (2 wheeled) Transfers: Sit to/from Stand Sit to Stand: Min assist         General transfer comment: min assist for safety especially with stand to sit as pt is impulsive and doesnt fully back up to surface before attempting to sit. Cues to back up and feel 3in1 on back of LEs.    Balance                                   ADL                           Toilet Transfer: Minimal assistance;Ambulation;BSC;RW   Toileting- Clothing Manipulation and Hygiene: Minimal assistance;Sit to/from stand         General ADL Comments: Family education provided for functional toilet transfer. Husband took notes and asked questions. Pt continues to be impulsive and tries to sit down before fully backed up to 3in1. She needs verbal cues for proper sequence with walker for safety and LE management during transfers. She tried to sit down in recliner despite being fully backed up to it despite cues earlier in session  for this. Will need HHOT to practice shower transfer. Feel pt is not yet safe to do showering and pt/family in agreement to let HHOT assess showering. Educated on sequence for LB dressing.       Vision                     Perception     Praxis      Cognition   Behavior During Therapy: Impulsive Overall Cognitive Status: Within Functional Limits for tasks assessed                       Extremity/Trunk Assessment               Exercises     Shoulder Instructions       General Comments      Pertinent Vitals/ Pain       Pain Assessment: 0-10 Pain Score: 8  Pain Location: L knee Pain Descriptors / Indicators: Aching Pain Intervention(s): Repositioned;Ice applied  Home Living  Prior Functioning/Environment              Frequency Min 2X/week     Progress Toward Goals  OT Goals(current goals can now be found in the care plan section)  Progress towards OT goals: Not progressing toward goals - comment (pt is impulsive)     Plan Discharge plan needs to be updated    Co-evaluation                 End of Session Equipment Utilized During Treatment: Rolling walker CPM Left Knee CPM Left Knee: Off   Activity Tolerance Patient limited by pain   Patient Left in chair;with call bell/phone within reach;with family/visitor present   Nurse Communication          Time: 2992-4268 OT Time Calculation (min): 20 min  Charges: OT General Charges $OT Visit: 1 Procedure OT Treatments $Therapeutic Activity: 8-22 mins  Jules Schick  [phone] 06/30/2014, 11:20 AM

## 2014-06-30 NOTE — Progress Notes (Signed)
OT recc. And order placed.  CM called [employer] rep, Tim to add on OT for HHPT/OT. No other CM needs were communicated.

## 2014-06-30 NOTE — Addendum Note (Signed)
Addendum  created 06/30/14 0948 by Lissa Morales, CRNA   Modules edited: Anesthesia Medication Administration

## 2014-06-30 NOTE — Progress Notes (Signed)
   Subjective: 2 Days Post-Op Procedure(s) (LRB): LEFT TOTAL KNEE ARTHROPLASTY (Left) Patient reports pain as mild.   Patient seen in rounds by Dr. Wynelle Link. Patient is well, but has had some minor complaints of pain in the knee, requiring pain medications Patient is ready to go home following therapy  Objective: Vital signs in last 24 hours: Temp:  [98.2 F (36.8 C)-98.8 F (37.1 C)] 98.8 F (37.1 C) (06/29 0455) Pulse Rate:  [65-83] 68 (06/29 0455) Resp:  [16-20] 20 (06/29 0455) BP: (118-143)/(56-75) 143/56 mmHg (06/29 0455) SpO2:  [97 %-99 %] 98 % (06/29 0455)  Intake/Output from previous day:  Intake/Output Summary (Last 24 hours) at 06/30/14 0706 Last data filed at 06/30/14 0455  Gross per 24 hour  Intake 1743.17 ml  Output   1402 ml  Net 341.17 ml    Labs:  Recent Labs  06/29/14 0408 06/30/14 0431  HGB 12.5 12.1    Recent Labs  06/29/14 0408 06/30/14 0431  WBC 11.6* 11.4*  RBC 3.89 3.71*  HCT 35.4* 34.2*  PLT 201 219    Recent Labs  06/29/14 0408 06/30/14 0431  NA 138 139  K 4.1 4.0  CL 104 103  CO2 29 30  BUN 12 12  CREATININE 0.58 0.50  GLUCOSE 160* 112*  CALCIUM 8.0* 8.3*   No results for input(s): LABPT, INR in the last 72 hours.  EXAM: General - Patient is Alert, Appropriate and Oriented Extremity - Neurovascular intact Sensation intact distally Dorsiflexion/Plantar flexion intact Incision - clean, dry, no drainage Motor Function - intact, moving foot and toes well on exam.   Assessment/Plan: 2 Days Post-Op Procedure(s) (LRB): LEFT TOTAL KNEE ARTHROPLASTY (Left) Procedure(s) (LRB): LEFT TOTAL KNEE ARTHROPLASTY (Left) Past Medical History  Diagnosis Date  . Hypertension   . Hyperlipidemia   . Arthritis   . Complication of anesthesia     vomiting from anything  . PONV (postoperative nausea and vomiting)   . Dysrhythmia     PVC's  . Anxiety    Principal Problem:   OA (osteoarthritis) of knee  Estimated body mass index  is 24.87 kg/(m^2) as calculated from the following:   Height as of this encounter: 5\' 2"  (1.575 m).   Weight as of this encounter: 61.689 kg (136 lb). Up with therapy Discharge home with home health Diet - Cardiac diet Follow up - in 2 weeks Activity - WBAT Disposition - Home Condition Upon Discharge - Good D/C Meds - See DC Summary DVT Prophylaxis - Xarelto  Arlee Muslim, PA-C Orthopaedic Surgery 06/30/2014, 7:06 AM

## 2014-06-30 NOTE — Progress Notes (Signed)
Physical Therapy Treatment Patient Details Name: Danielle Hayes MRN: [id number] DOB: 1950/12/09 Today's Date: 06/30/2014    History of Present Illness LTKA    PT Comments    Patient continues to demonstrate decreased safety, impulsiveness. Patient requires frequent cues for safety. Plan practice steps with spouse.  Follow Up Recommendations  Supervision/Assistance - 24 hour     Equipment Recommendations  None recommended by PT    Recommendations for Other Services       Precautions / Restrictions Precautions Precautions: Knee Required Braces or Orthoses: Knee Immobilizer - Left Knee Immobilizer - Left: Discontinue once straight leg raise with < 10 degree lag Restrictions Weight Bearing Restrictions: No    Mobility  Bed Mobility Overal bed mobility: Needs Assistance Bed Mobility: Supine to Sit     Supine to sit: Min guard Sit to supine: Min guard   General bed mobility comments: patient requires frequent cues for safety, tends to move fast, does not follow clearly, very "nervous", using R fot to self assit L leg  Transfers Overall transfer level: Needs assistance Equipment used: Rolling walker (2 wheeled) Transfers: Sit to/from Stand Sit to Stand: Min guard         General transfer comment: cues for safety from bed , tends to not have Rw close, found standing at bedside completing her bath, reiterated need for safety  Ambulation/Gait Ambulation/Gait assistance: Min guard Ambulation Distance (Feet): 250 Feet Assistive device: Rolling walker (2 wheeled) Gait Pattern/deviations: Step-to pattern;Step-through pattern;Decreased step length - left;Antalgic     General Gait Details: cues for sequence, cues to keep hands on RW. removed KI for last 51' and patient did well.    Stairs Stairs: Yes Stairs assistance: Mod assist Stair Management: No rails;Backwards;Step to pattern;With walker Number of Stairs: 2 General stair comments: multimodal cues for   technique, decreased following commands, letting go of RW, decreased problmem solving.  Wheelchair Mobility    Modified Rankin (Stroke Patients Only)       Balance Overall balance assessment: Needs assistance         Standing balance support: During functional activity;No upper extremity supported Standing balance-Leahy Scale: Fair                      Cognition Arousal/Alertness: Awake/alert Behavior During Therapy: Impulsive Overall Cognitive Status: Within Functional Limits for tasks assessed Area of Impairment: Safety/judgement;Problem solving     Memory: Decreased recall of precautions;Decreased short-term memory              Exercises Total Joint Exercises Ankle Circles/Pumps: AROM;Both;10 reps;Supine Quad Sets: 10 reps;Both;AROM;Supine Heel Slides: AAROM;Left;10 reps;Supine Hip ABduction/ADduction: AROM;Left;10 reps;Supine Straight Leg Raises: AAROM;Left;10 reps;Supine Goniometric ROM: 10-30 L knee very tight    General Comments        Pertinent Vitals/Pain Pain Assessment: 0-10 Pain Score: 6  Pain Location: L knee Pain Descriptors / Indicators: Aching Pain Intervention(s): Monitored during session;Premedicated before session;Repositioned;Ice applied    Home Living                      Prior Function            PT Goals (current goals can now be found in the care plan section) Progress towards PT goals: Progressing toward goals    Frequency  7X/week    PT Plan Current plan remains appropriate    Co-evaluation             End of Session   Activity  Tolerance: Patient tolerated treatment well Patient left: in bed;with call bell/phone within reach     Time: 0825-0910 PT Time Calculation (min) (ACUTE ONLY): 45 min  Charges:  $Gait Training: 8-22 mins $Therapeutic Exercise: 8-22 mins $Self Care/Home Management: 10-Sep-2022                    G Codes:      [name] 06/30/2014, 11:32 AM

## 2014-06-30 NOTE — Progress Notes (Signed)
Physical Therapy Treatment Patient Details Name: Danielle Hayes MRN: [id number] DOB: 13-Cherrie-1952 Today's Date: 06/30/2014    History of Present Illness LTKA    PT Comments    Has practiced steps with spouse.  Follow Up Recommendations  Supervision/Assistance - 24 hour     Equipment Recommendations  None recommended by PT    Recommendations for Other Services       Precautions / Restrictions Precautions Precautions: Knee Required Braces or Orthoses: Knee Immobilizer - Left Knee Immobilizer - Left: Discontinue once straight leg raise with < 10 degree lag Restrictions Weight Bearing Restrictions: No    Mobility  Bed Mobility Overal bed mobility: Needs Assistance Bed Mobility: Supine to Sit     Supine to sit: Min guard Sit to supine: Min guard   General bed mobility comments: practices with spouse and use of  a leg lifter on/off a high bed with spouse guarding, patient did not want spouse to asist with leg. patient struggled but gradually able to perform,   Transfers Overall transfer level: Needs assistance Equipment used: Rolling walker (2 wheeled) Transfers: Sit to/from Stand Sit to Stand: Min guard         General transfer comment: cues for safety from bed , tends to not have Rw close, found standing at bedside completing her bath, reiterated need for safety  Ambulation/Gait Ambulation/Gait assistance: Min guard Ambulation Distance (Feet): 50 Feet Assistive device: Rolling walker (2 wheeled) Gait Pattern/deviations: Step-to pattern     General Gait Details: cues for sequence, cues to keep hands on RW. removed KI for last 50' and patient did well.    Stairs Stairs: Yes Stairs assistance: Mod assist Stair Management: No rails;Step to pattern;Backwards;With walker Number of Stairs: 2 General stair comments: spouse present for instruction, practiced x 2.  Wheelchair Mobility    Modified Rankin (Stroke Patients Only)       Balance Overall balance  assessment: Needs assistance         Standing balance support: During functional activity;No upper extremity supported Standing balance-Leahy Scale: Fair                      Cognition Arousal/Alertness: Awake/alert Behavior During Therapy: Impulsive Overall Cognitive Status: Within Functional Limits for tasks assessed Area of Impairment: Safety/judgement;Problem solving     Memory: Decreased recall of precautions;Decreased short-term memory              Exercises Total Joint Exercises Ankle Circles/Pumps: AROM;Both;10 reps;Supine Quad Sets: 10 reps;Both;AROM;Supine Heel Slides: AAROM;Left;10 reps;Supine Hip ABduction/ADduction: AROM;Left;10 reps;Supine Straight Leg Raises: AAROM;Left;10 reps;Supine Goniometric ROM: 10-30 L knee very tight    General Comments        Pertinent Vitals/Pain Pain Assessment: 0-10 Pain Score: 5  Pain Location: L knee Pain Descriptors / Indicators: Aching Pain Intervention(s): Monitored during session;Premedicated before session;Repositioned;Ice applied    Home Living                      Prior Function            PT Goals (current goals can now be found in the care plan section) Progress towards PT goals: Progressing toward goals    Frequency  7X/week    PT Plan Current plan remains appropriate    Co-evaluation             End of Session   Activity Tolerance: Patient tolerated treatment well Patient left: in bed;with call bell/phone within reach  Time: 0454-0981 PT Time Calculation (min) (ACUTE ONLY): 37 min  Charges:  $Gait Training: 8-22 mins $Therapeutic Exercise: 8-22 mins $Self Care/Home Management: 09/19/22                    G Codes:      [name] 06/30/2014, 11:36 AM

## 2014-08-05 ENCOUNTER — Ambulatory Visit: Payer: Self-pay | Admitting: Orthopedic Surgery

## 2014-08-05 NOTE — Patient Instructions (Addendum)
Danielle Hayes  08/05/2014   Your procedure is scheduled on: 08/09/2014    Report to [facility] Main  Entrance take [facility]  elevators to 3rd floor to  [place] at     240pm  Call this number if you have problems the morning of surgery [phone]   Remember: ONLY 1 PERSON MAY GO WITH YOU TO SHORT STAY TO GET  READY MORNING OF YOUR SURGERY.  Do not eat food after midnite  May have clear liquids until 1130am then nothing by mouth.       Take these medicines the morning of surgery with A SIP OF WATER: amlodipine, Wellbutrin, hydromorphone if needed                                You may not have any metal on your body including hair pins and              piercings  Do not wear jewelry, make-up, lotions, powders or perfumes, deodorant             Do not wear nail polish.  Do not shave  48 hours prior to surgery.               Do not bring valuables to the hospital. [place].  Contacts, dentures or bridgework may not be worn into surgery.       Patients discharged the day of surgery will not be allowed to drive home.  Name and phone number of your driver:  Special Instructions: coughing and deep breathing exercises, leg exercises               Please read over the following fact sheets you were given: _____________________________________________________________________             [facility] - Preparing for Surgery      DO NOT USE HIBICLENS -- USE DIAL SOAP  Before surgery, you can play an important role.  Because skin is not sterile, your skin needs to be as free of germs as possible.  You can reduce the number of germs on your skin by washing with DIAL  soap before surgery.  DIAL   is an antiseptic cleaner which kills germs and bonds with the skin to continue killing germs even after washing. Please DO NOT use if you have an allergy to  or antibacterial soaps.  If your skin becomes reddened/irritated stop  using the [place] Allowed                                                                     Foods Excluded  Coffee and tea, regular and decaf                             liquids that you cannot  Plain Jell-O in any flavor  see through such as: Fruit ices (not with fruit pulp)                                     milk, soups, orange juice  Iced Popsicles                                    All solid food Carbonated beverages, regular and diet                                    Cranberry, grape and apple juices Sports drinks like Gatorade Lightly seasoned clear broth or consume(fat free) Sugar, honey syrup  Sample Menu Breakfast                                Lunch                                     Supper Cranberry juice                    Beef broth                            Chicken broth Jell-O                                     Grape juice                           Apple juice Coffee or tea                        Jell-O                                      Popsicle                                                Coffee or tea                        Coffee or tea  _____________________________________________________________________   and inform your nurse when you arrive at Short Stay. Do not shave (including legs and underarms) for at least 48 hours prior to the first DIAL  shower.  You may shave your face/neck. Please follow these instructions carefully:  1.  Shower with [place] the night before surgery and the  morning of Surgery.  2.  If you choose to wash your hair, wash your hair first as usual with your  normal  shampoo.  3.  After you shampoo, rinse your hair and body thoroughly to remove the  shampoo.  4.  Use DIAL  as you would any other liquid soap.  You can apply chg directly  to the skin and wash                       Gently with a scrungie or clean  washcloth.  5.  Apply the DIAL  Soap to your body ONLY FROM THE NECK DOWN.   Do not use on face/ open                           Wound or open sores. Avoid contact with eyes, ears mouth and genitals (private parts).                       Wash face,  Genitals (private parts) with your normal soap.             6.  Wash thoroughly, paying special attention to the area where your surgery  will be performed.  7.  Thoroughly rinse your body with warm water from the neck down.  8.  DO NOT shower/wash with your normal soap after using and rinsing off  the DIAL  Soap.                9.  Pat yourself dry with a clean towel.            10.  Wear clean pajamas.            11.  Place clean sheets on your bed the night of your first shower and do not  sleep with pets. Day of Surgery : Do not apply any lotions/deodorants the morning of surgery.  Please wear clean clothes to the hospital/surgery center.  FAILURE TO FOLLOW THESE INSTRUCTIONS MAY RESULT IN THE CANCELLATION OF YOUR SURGERY PATIENT SIGNATURE_________________________________  NURSE SIGNATURE__________________________________  ________________________________________________________________________

## 2014-08-05 NOTE — Progress Notes (Signed)
Need orders in EPIc.  Surgery on 08/09/2014 .  Preop on 8/5/ at 0800am Thank You.

## 2014-08-05 NOTE — Progress Notes (Signed)
Preoperative surgical orders have been place into the Epic hospital system for Danielle Hayes on 08/05/2014, 11:41 AM  by Mickel Crow for surgery on 08/09/2014.  Preop Knee orders including IV Tylenol and IV Decadron as long as there are no contraindications to the above medications. Arlee Muslim, PA-C

## 2014-08-06 ENCOUNTER — Encounter (HOSPITAL_COMMUNITY): Payer: Self-pay

## 2014-08-06 ENCOUNTER — Encounter (HOSPITAL_COMMUNITY)
Admission: RE | Admit: 2014-08-06 | Discharge: 2014-08-06 | Disposition: A | Discharge status: Home / Self Care | Payer: BLUE CROSS/BLUE SHIELD | Source: Ambulatory Visit | Attending: Orthopedic Surgery | Admitting: Orthopedic Surgery

## 2014-08-06 DIAGNOSIS — M24662 Ankylosis, left knee: Secondary | ICD-10-CM | POA: Diagnosis not present

## 2014-08-06 DIAGNOSIS — I10 Essential (primary) hypertension: Secondary | ICD-10-CM | POA: Diagnosis not present

## 2014-08-06 DIAGNOSIS — E785 Hyperlipidemia, unspecified: Secondary | ICD-10-CM | POA: Diagnosis not present

## 2014-08-06 DIAGNOSIS — Z96652 Presence of left artificial knee joint: Secondary | ICD-10-CM | POA: Diagnosis not present

## 2014-08-06 LAB — CBC
HCT: 40.7 % (ref 36.0–46.0)
Hemoglobin: 14.1 g/dL (ref 12.0–15.0)
MCH: 31.7 pg (ref 26.0–34.0)
MCHC: 34.6 g/dL (ref 30.0–36.0)
MCV: 91.5 fL (ref 78.0–100.0)
Platelets: 238 10*3/uL (ref 150–400)
RBC: 4.45 MIL/uL (ref 3.87–5.11)
RDW: 12.8 % (ref 11.5–15.5)
WBC: 5.1 10*3/uL (ref 4.0–10.5)

## 2014-08-06 LAB — BASIC METABOLIC PANEL
Anion gap: 7 (ref 5–15)
BUN: 18 mg/dL (ref 6–20)
CHLORIDE: 101 mmol/L (ref 101–111)
CO2: 29 mmol/L (ref 22–32)
Calcium: 9.2 mg/dL (ref 8.9–10.3)
Creatinine, Ser: 0.62 mg/dL (ref 0.44–1.00)
GFR calc Af Amer: >60 mL/min (ref >60)
GFR calc non Af Amer: >60 mL/min (ref >60)
GLUCOSE: 88 mg/dL (ref 65–99)
Potassium: 4 mmol/L (ref 3.5–5.1)
Sodium: 137 mmol/L (ref 135–145)

## 2014-08-06 NOTE — Progress Notes (Signed)
ekg 06-22-14 epic

## 2014-08-09 ENCOUNTER — Ambulatory Visit (HOSPITAL_COMMUNITY): Payer: BLUE CROSS/BLUE SHIELD | Admitting: Anesthesiology

## 2014-08-09 ENCOUNTER — Encounter (HOSPITAL_COMMUNITY)
Admission: RE | Disposition: A | Discharge status: Home / Self Care | Payer: Self-pay | Source: Ambulatory Visit | Attending: Orthopedic Surgery

## 2014-08-09 ENCOUNTER — Encounter (HOSPITAL_COMMUNITY): Payer: Self-pay

## 2014-08-09 ENCOUNTER — Ambulatory Visit (HOSPITAL_COMMUNITY)
Admission: RE | Admit: 2014-08-09 | Discharge: 2014-08-09 | Disposition: A | Discharge status: Home / Self Care | Payer: BLUE CROSS/BLUE SHIELD | Source: Ambulatory Visit | Attending: Orthopedic Surgery | Admitting: Orthopedic Surgery

## 2014-08-09 DIAGNOSIS — T8482XA Fibrosis due to internal orthopedic prosthetic devices, implants and grafts, initial encounter: Secondary | ICD-10-CM | POA: Diagnosis present

## 2014-08-09 DIAGNOSIS — E785 Hyperlipidemia, unspecified: Secondary | ICD-10-CM | POA: Insufficient documentation

## 2014-08-09 DIAGNOSIS — M24662 Ankylosis, left knee: Secondary | ICD-10-CM | POA: Insufficient documentation

## 2014-08-09 DIAGNOSIS — Z96652 Presence of left artificial knee joint: Secondary | ICD-10-CM | POA: Insufficient documentation

## 2014-08-09 DIAGNOSIS — I10 Essential (primary) hypertension: Secondary | ICD-10-CM | POA: Insufficient documentation

## 2014-08-09 HISTORY — PX: KNEE CLOSED REDUCTION: SHX995

## 2014-08-09 SURGERY — MANIPULATION, KNEE, CLOSED
Anesthesia: Monitor Anesthesia Care | Site: Knee | Laterality: Left

## 2014-08-09 MED ORDER — LACTATED RINGERS IV SOLN
INTRAVENOUS | Status: DC | PRN
Start: 2014-08-09 — End: 2014-08-09
  Administered 2014-08-09: 16:00:00 via INTRAVENOUS

## 2014-08-09 MED ORDER — FENTANYL CITRATE (PF) 100 MCG/2ML IJ SOLN
25.0000 ug | INTRAMUSCULAR | Status: DC | PRN
  Administered 2014-08-09 (×4): 25 ug via INTRAVENOUS

## 2014-08-09 MED ORDER — ACETAMINOPHEN 10 MG/ML IV SOLN
1000.0000 mg | Freq: Once | INTRAVENOUS | Status: AC
  Administered 2014-08-09: 1000 mg via INTRAVENOUS
  Filled 2014-08-09: qty 100

## 2014-08-09 MED ORDER — MIDAZOLAM HCL 5 MG/5ML IJ SOLN
INTRAMUSCULAR | Status: DC | PRN
  Administered 2014-08-09: 2 mg via INTRAVENOUS

## 2014-08-09 MED ORDER — DEXAMETHASONE SODIUM PHOSPHATE 10 MG/ML IJ SOLN
INTRAMUSCULAR | Status: AC
  Filled 2014-08-09: qty 1

## 2014-08-09 MED ORDER — FENTANYL CITRATE (PF) 100 MCG/2ML IJ SOLN
INTRAMUSCULAR | Status: AC
  Filled 2014-08-09: qty 2

## 2014-08-09 MED ORDER — PROPOFOL 10 MG/ML IV BOLUS
INTRAVENOUS | Status: DC | PRN
  Administered 2014-08-09 (×2): 100 mg via INTRAVENOUS

## 2014-08-09 MED ORDER — LACTATED RINGERS IV SOLN: INTRAVENOUS | Status: DC

## 2014-08-09 MED ORDER — ACETAMINOPHEN 10 MG/ML IV SOLN
INTRAVENOUS | Status: AC
  Filled 2014-08-09: qty 100

## 2014-08-09 MED ORDER — PROMETHAZINE HCL 25 MG/ML IJ SOLN: 6.2500 mg | INTRAMUSCULAR | Status: DC | PRN

## 2014-08-09 MED ORDER — HYDROMORPHONE HCL 2 MG PO TABS: 2.0000 mg | ORAL_TABLET | ORAL | Status: DC | PRN

## 2014-08-09 MED ORDER — SODIUM CHLORIDE 0.9 % IV SOLN: INTRAVENOUS | Status: DC

## 2014-08-09 MED ORDER — DEXAMETHASONE SODIUM PHOSPHATE 10 MG/ML IJ SOLN
10.0000 mg | Freq: Once | INTRAMUSCULAR | Status: AC
  Administered 2014-08-09: 10 mg via INTRAVENOUS

## 2014-08-09 MED ORDER — FENTANYL CITRATE (PF) 100 MCG/2ML IJ SOLN
INTRAMUSCULAR | Status: AC
  Filled 2014-08-09: qty 4

## 2014-08-09 MED ORDER — PROPOFOL 10 MG/ML IV BOLUS
INTRAVENOUS | Status: AC
  Filled 2014-08-09: qty 20

## 2014-08-09 MED ORDER — ONDANSETRON HCL 4 MG/2ML IJ SOLN
INTRAMUSCULAR | Status: DC | PRN
  Administered 2014-08-09: 4 mg via INTRAVENOUS

## 2014-08-09 MED ORDER — MIDAZOLAM HCL 2 MG/2ML IJ SOLN
INTRAMUSCULAR | Status: AC
  Filled 2014-08-09: qty 4

## 2014-08-09 MED ORDER — ONDANSETRON HCL 4 MG/2ML IJ SOLN
INTRAMUSCULAR | Status: AC
  Filled 2014-08-09: qty 2

## 2014-08-09 SURGICAL SUPPLY — 1 items: WRAP KNEE MAXI GEL POST OP (GAUZE/BANDAGES/DRESSINGS) ×2 IMPLANT

## 2014-08-09 NOTE — Discharge Instructions (Signed)
Resume your physical therapy tomorrow  Do activities as tolerated

## 2014-08-09 NOTE — Transfer of Care (Signed)
Immediate Anesthesia Transfer of Care Note  Patient: Danielle Hayes  Procedure(s) Performed: Procedure(s): LEFT KNEE CLOSED MANIPULATION  (Left)  Patient Location: PACU  Anesthesia Type:MAC  Level of Consciousness:   patient cooperative and responds to stimulation  Airway & Oxygen Therapy:Patient Spontanous Breathing and Patient connected to face mask oxgen  Post-op Assessment:  Report given to PACU RN and Post -op Vital signs reviewed and stable  Post vital signs:  Reviewed and stable  Last Vitals:  Filed Vitals:   08/09/14 1412  BP: 149/85  Pulse: 84  Temp: 36.7 C  Resp: 16    Complications: No apparent anesthesia complications

## 2014-08-09 NOTE — H&P (Signed)
CC- Alinna R Snider is a 64 y.o. female who presents with left knee pain.  HPI- . Knee Pain: Patient presents with stiffness involving the  left knee. Onset of the symptoms was several weeks ago. Inciting event: She had a left Total Knee Arthroplasty on 06/28/14 and has had difficulty gaining knee flexion despite adequate PT . She has been stuck at 85-90 degrees of flexion and presents today for closed manipulation.    Past Medical History  Diagnosis Date  . Hypertension   . Hyperlipidemia   . Arthritis   . Dysrhythmia     PVC's  . Anxiety   . Complication of anesthesia     has big time phobia from nausea vomting last time got sicj was at 64 years old   . PONV (postoperative nausea and vomiting)     Past Surgical History  Procedure Laterality Date  . Knee arthroscopy      right and left  . Leep    . Tonsillectomy    . Knee arthroscopy      right, left  . Total knee arthroplasty Left 06/28/2014    Procedure: LEFT TOTAL KNEE ARTHROPLASTY;  Surgeon: Gaynelle Arabian, MD;  Location: [facility];  Service: Orthopedics;  Laterality: Left;    Prior to Admission medications   Medication Sig Start Date End Date Taking? Authorizing Provider  amLODipine (NORVASC) 2.5 MG tablet Take 2.5 mg by mouth every morning.    Yes Historical Provider, MD  atorvastatin (LIPITOR) 40 MG tablet Take 40 mg by mouth every morning.    Yes Historical Provider, MD  buPROPion (WELLBUTRIN XL) 300 MG 24 hr tablet Take 300 mg by mouth every morning.    Yes Historical Provider, MD  DIVIGEL 1 MG/GM GEL Apply 1 application topically every evening. 05/15/14  Yes Historical Provider, MD  FINACEA 15 % cream Apply 1 application topically 2 (two) times daily.  06/17/13  Yes Historical Provider, MD  HYDROmorphone (DILAUDID) 2 MG tablet Take 2 mg by mouth every 4 (four) hours as needed for moderate pain or severe pain.   Yes Historical Provider, MD  ibuprofen (ADVIL) 200 MG tablet Take 600 mg by mouth 3 (three) times daily as needed  for moderate pain.   Yes Historical Provider, MD  ondansetron (ZOFRAN) 4 MG tablet Take 4 mg by mouth every 8 (eight) hours as needed for nausea or vomiting.   Yes Historical Provider, MD  progesterone (PROMETRIUM) 200 MG capsule Take 1 capsule by mouth. Every third month for 14 days 06/17/14  Yes Historical Provider, MD  triamterene-hydrochlorothiazide (MAXZIDE-25) 37.5-25 MG per tablet Take 0.5 tablets by mouth every morning.    Yes Historical Provider, MD  aspirin 81 MG tablet Take 81 mg by mouth daily.    Historical Provider, MD  Cholecalciferol (VITAMIN D3) 5000 UNIT/ML LIQD Take 5,000 Units by mouth daily.    Historical Provider, MD  Glucosamine Sulfate 1000 MG CAPS Take by mouth once.    Historical Provider, MD  Lactobacillus (PROBIOTIC ACIDOPHILUS PO) Take by mouth once. Suprema  dophilius    Historical Provider, MD  methocarbamol (ROBAXIN) 500 MG tablet Take 1 tablet (500 mg total) by mouth every 6 (six) hours as needed for muscle spasms. Patient not taking: Reported on 08/05/2014 06/30/14   Arlee Muslim, PA-C  Multiple Vitamins-Minerals (MULTIVITAMIN WITH MINERALS) tablet Take 1 tablet by mouth daily.    Historical Provider, MD  Omega-3 Fatty Acids (OMEGA-3 FISH OIL) 1200 MG CAPS Take by mouth.    Historical  Provider, MD  oxyCODONE (OXY IR/ROXICODONE) 5 MG immediate release tablet Take 1-2 tablets (5-10 mg total) by mouth every 3 (three) hours as needed for moderate pain or severe pain. Patient not taking: Reported on 08/05/2014 06/30/14   Arlee Muslim, PA-C  Probiotic Product (PROBIOTIC ADVANCED PO) Take by mouth every morning. Mega zyme one per day    Historical Provider, MD  rivaroxaban (XARELTO) 10 MG TABS tablet Take 1 tablet (10 mg total) by mouth daily with breakfast. Take Xarelto for two and a half more weeks, then discontinue Xarelto. Once the patient has completed the Xarelto, they may resume the 81 mg Aspirin. Patient not taking: Reported on 08/05/2014 06/30/14   Arlee Muslim, PA-C   traMADol (ULTRAM) 50 MG tablet Take 1-2 tablets (50-100 mg total) by mouth every 6 (six) hours as needed (mild pain). Patient not taking: Reported on 08/05/2014 06/30/14   Arlee Muslim, PA-C   KNEE EXAM antalgic gait, no effusion, range of motion 10-85 degrees  Physical Examination: General appearance - alert, well appearing, and in no distress Mental status - alert, oriented to person, place, and time Chest - clear to auscultation, no wheezes, rales or rhonchi, symmetric air entry Heart - normal rate, regular rhythm, normal S1, S2, no murmurs, rubs, clicks or gallops Abdomen - soft, nontender, nondistended, no masses or organomegaly Neurological - alert, oriented, normal speech, no focal findings or movement disorder noted   Asessment/Plan--- Left knee arthrofibrosis- - Plan left knee closed manipulation. Procedure risks and potential comps discussed with patient who elects to proceed. Goals are decreased pain and increased function with a high likelihood of achieving both

## 2014-08-09 NOTE — Interval H&P Note (Signed)
History and Physical Interval Note:  08/09/2014 4:20 PM  Danielle Hayes  has presented today for surgery, with the diagnosis of ARTHROFIBROSIS LEFT KNEE   The various methods of treatment have been discussed with the patient and family. After consideration of risks, benefits and other options for treatment, the patient has consented to  Procedure(s): LEFT KNEE CLOSED MANIPULATION  (Left) as a surgical intervention .  The patient's history has been reviewed, patient examined, no change in status, stable for surgery.  I have reviewed the patient's chart and labs.  Questions were answered to the patient's satisfaction.     Gearlean Alf

## 2014-08-09 NOTE — Op Note (Signed)
  OPERATIVE REPORT   PREOPERATIVE DIAGNOSIS: Arthrofibrosis, Left  knee.   POSTOPERATIVE DIAGNOSIS: Arthrofibrosis, Left knee.   PROCEDURE:  Left  knee closed manipulation.   SURGEON: Gaynelle Arabian, MD   ASSISTANT: None.   ANESTHESIA: General.   COMPLICATIONS: None.   CONDITION: Stable to Recovery.   Pre-manipulation range of motion is 5-85.  Post-manipulation range of  Motion is 0-130  PROCEDURE IN DETAIL: After successful administration of general  anesthetic, exam under anesthesia was performed showing range of motion  5-85 degrees. I then placed my chest against the proximal tibia,  flexing the knee with audible lysis of adhesions. I was easily able to  get the knee flexed to 130  degrees. I then put the knee back in extension and with some  patellar manipulation and gentle pressure got to full  Extension.The patient was subsequently awakened and transported to Recovery in  stable condition.

## 2014-08-09 NOTE — Anesthesia Preprocedure Evaluation (Addendum)
Anesthesia Evaluation  Patient identified by MRN, date of birth, ID band Patient awake    Reviewed: Allergy & Precautions, NPO status , Patient's Chart, lab work & pertinent test results  History of Anesthesia Complications (+) PONV and history of anesthetic complications  Airway Mallampati: II  TM Distance: >3 FB Neck ROM: Full    Dental  (+) Teeth Intact, Dental Advisory Given   Pulmonary neg pulmonary ROS,    Pulmonary exam normal       Cardiovascular hypertension, Pt. on medications Normal cardiovascular exam+ dysrhythmias (PVCs)     Neuro/Psych PSYCHIATRIC DISORDERS Anxiety negative neurological ROS     GI/Hepatic negative GI ROS, Neg liver ROS,   Endo/Other  negative endocrine ROS  Renal/GU negative Renal ROS     Musculoskeletal  (+) Arthritis -,   Abdominal   Peds  Hematology negative hematology ROS (+)   Anesthesia Other Findings   Reproductive/Obstetrics                            Anesthesia Physical  Anesthesia Plan  ASA: II  Anesthesia Plan: MAC   Post-op Pain Management:    Induction: Intravenous  Airway Management Planned: Simple Face Mask  Additional Equipment:   Intra-op Plan:   Post-operative Plan:   Informed Consent: I have reviewed the patients History and Physical, chart, labs and discussed the procedure including the risks, benefits and alternatives for the proposed anesthesia with the patient or authorized representative who has indicated his/her understanding and acceptance.   Dental advisory given  Plan Discussed with: CRNA, Anesthesiologist and Surgeon  Anesthesia Plan Comments: (Propofol bolus/infusion for MAC sedation, patient with significant hx of PONV in past)       Anesthesia Quick Evaluation

## 2014-08-09 NOTE — Anesthesia Postprocedure Evaluation (Signed)
  Anesthesia Post-op Note  Patient: Danielle Hayes  Procedure(s) Performed: Procedure(s) (LRB): LEFT KNEE CLOSED MANIPULATION  (Left)  Patient Location: PACU  Anesthesia Type: MAC  Level of Consciousness: awake and alert   Airway and Oxygen Therapy: Patient Spontanous Breathing  Post-op Pain: mild  Post-op Assessment: Post-op Vital signs reviewed, Patient's Cardiovascular Status Stable, Respiratory Function Stable, Patent Airway and No signs of Nausea or vomiting  Last Vitals:  Filed Vitals:   08/09/14 1810  BP: 145/79  Pulse: 76  Temp: 36.5 C  Resp: 16    Post-op Vital Signs: stable   Complications: No apparent anesthesia complications

## 2014-08-10 ENCOUNTER — Encounter (HOSPITAL_COMMUNITY): Payer: Self-pay | Admitting: Orthopedic Surgery

## 2014-09-21 ENCOUNTER — Other Ambulatory Visit: Payer: Self-pay

## 2014-09-21 DIAGNOSIS — Z1231 Encounter for screening mammogram for malignant neoplasm of breast: Secondary | ICD-10-CM

## 2014-10-02 DIAGNOSIS — A0472 Enterocolitis due to Clostridium difficile, not specified as recurrent: Secondary | ICD-10-CM

## 2014-10-02 HISTORY — DX: Enterocolitis due to Clostridium difficile, not specified as recurrent: A04.72

## 2014-10-25 ENCOUNTER — Ambulatory Visit
Admission: RE | Admit: 2014-10-25 | Discharge: 2014-10-25 | Disposition: A | Discharge status: Home / Self Care | Payer: BLUE CROSS/BLUE SHIELD | Source: Ambulatory Visit

## 2014-10-25 DIAGNOSIS — Z1231 Encounter for screening mammogram for malignant neoplasm of breast: Secondary | ICD-10-CM

## 2014-10-27 ENCOUNTER — Other Ambulatory Visit: Payer: Self-pay | Admitting: Gynecology

## 2014-10-27 DIAGNOSIS — R928 Other abnormal and inconclusive findings on diagnostic imaging of breast: Secondary | ICD-10-CM

## 2014-10-29 ENCOUNTER — Emergency Department (HOSPITAL_COMMUNITY)
Admission: EM | Admit: 2014-10-29 | Discharge: 2014-10-29 | Disposition: A | Discharge status: Home / Self Care | Payer: BLUE CROSS/BLUE SHIELD | Attending: Emergency Medicine | Admitting: Emergency Medicine

## 2014-10-29 ENCOUNTER — Encounter (HOSPITAL_COMMUNITY): Payer: Self-pay | Admitting: Emergency Medicine

## 2014-10-29 DIAGNOSIS — Z8659 Personal history of other mental and behavioral disorders: Secondary | ICD-10-CM | POA: Insufficient documentation

## 2014-10-29 DIAGNOSIS — E785 Hyperlipidemia, unspecified: Secondary | ICD-10-CM | POA: Insufficient documentation

## 2014-10-29 DIAGNOSIS — M1990 Unspecified osteoarthritis, unspecified site: Secondary | ICD-10-CM | POA: Diagnosis not present

## 2014-10-29 DIAGNOSIS — K521 Toxic gastroenteritis and colitis: Secondary | ICD-10-CM | POA: Diagnosis not present

## 2014-10-29 DIAGNOSIS — T361X5A Adverse effect of cephalosporins and other beta-lactam antibiotics, initial encounter: Secondary | ICD-10-CM | POA: Diagnosis not present

## 2014-10-29 DIAGNOSIS — Z7982 Long term (current) use of aspirin: Secondary | ICD-10-CM | POA: Diagnosis not present

## 2014-10-29 DIAGNOSIS — I10 Essential (primary) hypertension: Secondary | ICD-10-CM | POA: Diagnosis not present

## 2014-10-29 DIAGNOSIS — Z8709 Personal history of other diseases of the respiratory system: Secondary | ICD-10-CM | POA: Diagnosis not present

## 2014-10-29 DIAGNOSIS — T3695XA Adverse effect of unspecified systemic antibiotic, initial encounter: Secondary | ICD-10-CM

## 2014-10-29 DIAGNOSIS — Z79899 Other long term (current) drug therapy: Secondary | ICD-10-CM | POA: Diagnosis not present

## 2014-10-29 DIAGNOSIS — R197 Diarrhea, unspecified: Secondary | ICD-10-CM | POA: Diagnosis present

## 2014-10-29 DIAGNOSIS — T65894A Toxic effect of other specified substances, undetermined, initial encounter: Secondary | ICD-10-CM | POA: Insufficient documentation

## 2014-10-29 LAB — CBC
HCT: 40.5 % (ref 36.0–46.0)
Hemoglobin: 14.7 g/dL (ref 12.0–15.0)
MCH: 31.5 pg (ref 26.0–34.0)
MCHC: 36.3 g/dL — AB (ref 30.0–36.0)
MCV: 86.7 fL (ref 78.0–100.0)
Platelets: 263 10*3/uL (ref 150–400)
RBC: 4.67 MIL/uL (ref 3.87–5.11)
RDW: 14.1 % (ref 11.5–15.5)
WBC: 4.9 10*3/uL (ref 4.0–10.5)

## 2014-10-29 LAB — COMPREHENSIVE METABOLIC PANEL
ALK PHOS: 88 U/L (ref 38–126)
ALT: 28 U/L (ref 14–54)
ANION GAP: 6 (ref 5–15)
AST: 31 U/L (ref 15–41)
Albumin: 3.8 g/dL (ref 3.5–5.0)
BILIRUBIN TOTAL: 0.5 mg/dL (ref 0.3–1.2)
BUN: 15 mg/dL (ref 6–20)
CO2: 30 mmol/L (ref 22–32)
CREATININE: 0.79 mg/dL (ref 0.44–1.00)
Calcium: 8.9 mg/dL (ref 8.9–10.3)
Chloride: 104 mmol/L (ref 101–111)
Glucose, Bld: 102 mg/dL — ABNORMAL HIGH (ref 65–99)
Potassium: 3.6 mmol/L (ref 3.5–5.1)
Sodium: 140 mmol/L (ref 135–145)
Total Protein: 6.8 g/dL (ref 6.5–8.1)

## 2014-10-29 LAB — TYPE AND SCREEN
ABO/RH(D): O POS
Antibody Screen: NEGATIVE

## 2014-10-29 MED ORDER — CALCIUM POLYCARBOPHIL 625 MG PO TABS: 625.0000 mg | ORAL_TABLET | Freq: Every day | ORAL | Status: DC

## 2014-10-29 NOTE — ED Notes (Signed)
Pt unable to provide stool sample at this time

## 2014-10-29 NOTE — ED Notes (Addendum)
While in restroom pt was unable to provide stool sample

## 2014-10-29 NOTE — ED Notes (Signed)
Pt encouraged to use restroom for stool sample multiple attempts; pt attempt twice without results. Attempt to collect POC occult blood digitally without visible stool/results; [facility] aware unable to collect either order.

## 2014-10-29 NOTE — Discharge Instructions (Signed)
Food Choices to Help Relieve Diarrhea, Adult When you have diarrhea, the foods you eat and your eating habits are very important. Choosing the right foods and drinks can help relieve diarrhea. Also, because diarrhea can last up to 7 days, you need to replace lost fluids and electrolytes (such as sodium, potassium, and chloride) in order to help prevent dehydration.  WHAT GENERAL GUIDELINES DO I NEED TO FOLLOW?  Slowly drink 1 cup (8 oz) of fluid for each episode of diarrhea. If you are getting enough fluid, your urine will be clear or pale yellow.  Eat starchy foods. Some good choices include white rice, white toast, pasta, low-fiber cereal, baked potatoes (without the skin), saltine crackers, and bagels.  Avoid large servings of any cooked vegetables.  Limit fruit to two servings per day. A serving is  cup or 1 small piece.  Choose foods with less than 2 g of fiber per serving.  Limit fats to less than 8 tsp (38 g) per day.  Avoid fried foods.  Eat foods that have probiotics in them. Probiotics can be found in certain dairy products.  Avoid foods and beverages that may increase the speed at which food moves through the stomach and intestines (gastrointestinal tract). Things to avoid include:  High-fiber foods, such as dried fruit, raw fruits and vegetables, nuts, seeds, and whole grain foods.  Spicy foods and high-fat foods.  Foods and beverages sweetened with high-fructose corn syrup, honey, or sugar alcohols such as xylitol, sorbitol, and mannitol. WHAT FOODS ARE RECOMMENDED? Grains White rice. White, [place], or pita breads (fresh or toasted), including plain rolls, buns, or bagels. White pasta. Saltine, soda, or graham crackers. Pretzels. Low-fiber cereal. Cooked cereals made with water (such as cornmeal, farina, or cream cereals). Plain muffins. Matzo. Melba toast. Zwieback.  Vegetables Potatoes (without the skin). Strained tomato and vegetable juices. Most well-cooked and canned  vegetables without seeds. Tender lettuce. Fruits Cooked or canned applesauce, apricots, cherries, fruit cocktail, grapefruit, peaches, pears, or plums. Fresh bananas, apples without skin, cherries, grapes, cantaloupe, grapefruit, peaches, oranges, or plums.  Meat and Other Protein Products Baked or boiled chicken. Eggs. Tofu. Fish. Seafood. Smooth peanut butter. Ground or well-cooked tender beef, ham, veal, lamb, pork, or poultry.  Dairy Plain yogurt, kefir, and unsweetened liquid yogurt. Lactose-free milk, buttermilk, or soy milk. Plain hard cheese. Beverages Sport drinks. Clear broths. Diluted fruit juices (except prune). Regular, caffeine-free sodas such as ginger ale. Water. Decaffeinated teas. Oral rehydration solutions. Sugar-free beverages not sweetened with sugar alcohols. Other Bouillon, broth, or soups made from recommended foods.  The items listed above may not be a complete list of recommended foods or beverages. Contact your dietitian for more options. WHAT FOODS ARE NOT RECOMMENDED? Grains Whole grain, whole wheat, bran, or rye breads, rolls, pastas, crackers, and cereals. Wild or brown rice. Cereals that contain more than 2 g of fiber per serving. Corn tortillas or taco shells. Cooked or dry oatmeal. Granola. Popcorn. Vegetables Raw vegetables. Cabbage, broccoli, Brussels sprouts, artichokes, baked beans, beet greens, corn, kale, legumes, peas, sweet potatoes, and yams. Potato skins. Cooked spinach and cabbage. Fruits Dried fruit, including raisins and dates. Raw fruits. Stewed or dried prunes. Fresh apples with skin, apricots, mangoes, pears, raspberries, and strawberries.  Meat and Other Protein Products Chunky peanut butter. Nuts and seeds. Beans and lentils. Berniece Salines.  Dairy High-fat cheeses. Milk, chocolate milk, and beverages made with milk, such as milk shakes. Cream. Ice cream. Sweets and Desserts Sweet rolls, doughnuts, and sweet breads.  Other Protein Products  Chunky peanut butter. Nuts and seeds. Beans and lentils. Bacon.   Dairy  High-fat cheeses. Milk, chocolate milk, and beverages made with milk, such as milk shakes. Cream. Ice cream.  Sweets and Desserts  Sweet rolls, doughnuts, and sweet breads. Pancakes and waffles.  Fats and  Oils  Butter. Cream sauces. Margarine. Salad oils. Plain salad dressings. Olives. Avocados.   Beverages  Caffeinated beverages (such as coffee, tea, soda, or energy drinks). Alcoholic beverages. Fruit juices with pulp. Prune juice. Soft drinks sweetened with high-fructose corn syrup or sugar alcohols.  Other  Coconut. Hot sauce. Chili powder. Mayonnaise. Gravy. Cream-based or milk-based soups.   The items listed above may not be a complete list of foods and beverages to avoid. Contact your dietitian for more information.  WHAT SHOULD I DO IF I BECOME DEHYDRATED?  Diarrhea can sometimes lead to dehydration. Signs of dehydration include dark urine and dry mouth and skin. If you think you are dehydrated, you should rehydrate with an oral rehydration solution. These solutions can be purchased at pharmacies, retail stores, or online.   Drink ½-1 cup (120-240 mL) of oral rehydration solution each time you have an episode of diarrhea. If drinking this amount makes your diarrhea worse, try drinking smaller amounts more often. For example, drink 1-3 tsp (5-15 mL) every 5-10 minutes.   A general rule for staying hydrated is to drink 1½-2 L of fluid per day. Talk to your health care provider about the specific amount you should be drinking each day. Drink enough fluids to keep your urine clear or pale yellow.     This information is not intended to replace advice given to you by your health care provider. Make sure you discuss any questions you have with your health care provider.     Document Released: 03/10/2003 Document Revised: 01/08/2014 Document Reviewed: 11/10/2012  Elsevier Interactive Patient Education ©2016 [employer]  Probiotics  WHAT ARE PROBIOTICS?  Probiotics are the good bacteria and yeasts that live in your body and keep you and your digestive system healthy. Probiotics also help your body's defense (immune) system and protect your body against bad bacterial growth.   Certain foods contain probiotics, such  as yogurt. Probiotics can also be purchased as a supplement. As with any supplement or drug, it is important to discuss its use with your health care provider.   WHAT AFFECTS THE BALANCE OF BACTERIA IN MY BODY?  The balance of bacteria in your body can be affected by:   · Antibiotic medicines. Antibiotics are sometimes necessary to treat infection. Unfortunately, they may kill good or friendly bacteria in your body as well as the bad bacteria. This may lead to stomach problems like diarrhea, gas, and cramping.  · Disease. Some conditions are the result of an overgrowth of bad bacteria, yeasts, parasites, or fungi. These conditions include:      Infectious diarrhea.    Stomach and respiratory infections.    Skin infections.    Irritable bowel syndrome (IBS).    Inflammatory bowel diseases.    Ulcer due to Helicobacter pylori (H. pylori) infection.    Tooth decay and periodontal disease.    Vaginal infections.  Stress and poor diet may also lower the good bacteria in your body.   WHAT TYPE OF PROBIOTIC IS RIGHT FOR ME?  Probiotics are available over the counter at your local pharmacy, health food, or grocery store. They come in many different forms, combinations of strains, and dosing strengths. Some   may need to be refrigerated. Always read the label for storage and usage instructions.  Specific strains have been shown to be more effective for certain conditions. Ask your health care provider what option is best for you.   WHY WOULD I NEED PROBIOTICS?  There are many reasons your health care provider might recommend a probiotic supplement, including:   · Diarrhea.  · Constipation.  · IBS.  · Respiratory infections.  · Yeast infections.  · Acne, eczema, and other skin conditions.  · Frequent urinary tract infections (UTIs).  ARE THERE SIDE EFFECTS OF PROBIOTICS?  Some people experience mild side effects when taking probiotics. Side effects are usually temporary and may include:    · Gas.  · Bloating.  · Cramping.  Rarely, serious side effects, such as infection or immune system changes, may occur.  WHAT ELSE DO I NEED TO KNOW ABOUT PROBIOTICS?   · There are many different strains of probiotics. Certain strains may be more effective depending on your condition. Probiotics are available in varying doses. Ask your health care provider which probiotic you should use and how often.    · If you are taking probiotics along with antibiotics, it is generally recommended to wait at least 2 hours between taking the antibiotic and taking the probiotic.    FOR MORE INFORMATION:   National Center for Complementary and Alternative Medicine http://nccam.nih.gov/     This information is not intended to replace advice given to you by your health care provider. Make sure you discuss any questions you have with your health care provider.     Document Released: 07/15/2013 Document Reviewed: 07/15/2013  Elsevier Interactive Patient Education ©2016 [employer] Inc[employer]

## 2014-10-29 NOTE — ED Notes (Addendum)
Pt reports diarrhea for the past week. Was recently on an abx for bronchitis, so she was put on metronidazole yesterday for any possible infection. Pt reports today she had an episode of diarrhea with bright red blood so she was told to come here. No emesis. Has generalized abd cramping

## 2014-10-30 NOTE — ED Provider Notes (Signed)
CSN: [id number]     Arrival date & time 10/29/14  1502 History   First MD Initiated Contact with Patient 10/29/14 1633     Chief Complaint  Patient presents with  . Diarrhea  . Rectal Bleeding     (Consider location/radiation/quality/duration/timing/severity/associated sxs/prior Treatment) Patient is a 64 y.o. female presenting with diarrhea. The history is provided by the patient.  Diarrhea Quality:  Semi-solid Severity:  Moderate Onset quality:  Gradual Duration:  1 week Timing:  Constant Progression:  Unchanged Relieved by:  Nothing Worsened by:  Nothing tried Ineffective treatments:  None tried Associated symptoms: no abdominal pain, no fever and no vomiting   Risk factors: recent antibiotic use     Past Medical History  Diagnosis Date  . Hypertension   . Hyperlipidemia   . Arthritis   . Dysrhythmia     PVC's  . Anxiety   . Complication of anesthesia     has big time phobia from nausea vomting last time got sicj was at 64 years old   . PONV (postoperative nausea and vomiting)    Past Surgical History  Procedure Laterality Date  . Knee arthroscopy      right and left  . Leep    . Tonsillectomy    . Knee arthroscopy      right, left  . Total knee arthroplasty Left 06/28/2014    Procedure: LEFT TOTAL KNEE ARTHROPLASTY;  Surgeon: Gaynelle Arabian, MD;  Location: [facility];  Service: Orthopedics;  Laterality: Left;  . Knee closed reduction Left 08/09/2014    Procedure: LEFT KNEE CLOSED MANIPULATION ;  Surgeon: Gaynelle Arabian, MD;  Location: [facility];  Service: Orthopedics;  Laterality: Left;   Family History  Problem Relation Age of Onset  . Colon cancer Neg Hx   . Pancreatic cancer Neg Hx   . Rectal cancer Neg Hx   . Stomach cancer Neg Hx    Social History  Substance Use Topics  . Smoking status: Never Smoker   . Smokeless tobacco: Never Used  . Alcohol Use: 3.6 oz/week    6 Glasses of wine per week     Comment: occassionally   OB History    No data available      Review of Systems  Constitutional: Negative for fever.  Gastrointestinal: Positive for diarrhea. Negative for vomiting and abdominal pain.  All other systems reviewed and are negative.     Allergies  Chlorhexidine; Hydrocodone; Oxycodone; Synvisc; and Xarelto  Home Medications   Prior to Admission medications   Medication Sig Start Date End Date Taking? Authorizing Provider  amLODipine (NORVASC) 2.5 MG tablet Take 2.5 mg by mouth every morning.    Yes Historical Provider, MD  aspirin 81 MG tablet Take 81 mg by mouth daily.   Yes Historical Provider, MD  atorvastatin (LIPITOR) 40 MG tablet Take 40 mg by mouth every morning.    Yes Historical Provider, MD  buPROPion (WELLBUTRIN XL) 300 MG 24 hr tablet Take 300 mg by mouth every morning.    Yes Historical Provider, MD  Cholecalciferol (VITAMIN D3) 5000 UNIT/ML LIQD Take 5,000 Units by mouth daily.   Yes Historical Provider, MD  DICLOFENAC PO Take 50 mg by mouth daily as needed (pain).    Yes Historical Provider, MD  DIVIGEL 1 MG/GM GEL Apply 1 application topically every evening. 05/15/14  Yes Historical Provider, MD  Estradiol (VAGIFEM VA) Place 10 mcg vaginally 2 (two) times a week.   Yes Historical Provider, MD  FINACEA 15 %  cream Apply 1 application topically 2 (two) times daily.  06/17/13  Yes Historical Provider, MD  Glucosamine Sulfate 1000 MG CAPS Take 1 capsule by mouth daily.    Yes Historical Provider, MD  ibuprofen (ADVIL) 200 MG tablet Take 600 mg by mouth 3 (three) times daily as needed for moderate pain.   Yes Historical Provider, MD  Lactobacillus (PROBIOTIC ACIDOPHILUS PO) Take by mouth once. Suprema  dophilius   Yes Historical Provider, MD  metroNIDAZOLE (FLAGYL) 500 MG tablet Take 1 tablet by mouth 3 (three) times daily. 10/28/14  Yes Historical Provider, MD  Multiple Vitamins-Minerals (MULTIVITAMIN WITH MINERALS) tablet Take 1 tablet by mouth daily.   Yes Historical Provider, MD  Omega-3 Fatty Acids (OMEGA-3 FISH  OIL) 1200 MG CAPS Take 1 capsule by mouth daily.    Yes Historical Provider, MD  OVER THE COUNTER MEDICATION Take 1 capsule by mouth daily. Suprema Dophilus   Yes Historical Provider, MD  Probiotic Product (PROBIOTIC ADVANCED PO) Take by mouth every morning. Mega zyme one per day   Yes Historical Provider, MD  progesterone (PROMETRIUM) 200 MG capsule Take 1 capsule by mouth. Every third month for 14 days 06/17/14  Yes Historical Provider, MD  triamterene-hydrochlorothiazide (MAXZIDE-25) 37.5-25 MG per tablet Take 0.5 tablets by mouth every morning.    Yes Historical Provider, MD  HYDROmorphone (DILAUDID) 2 MG tablet Take 1 tablet (2 mg total) by mouth every 4 (four) hours as needed for moderate pain or severe pain. Patient not taking: Reported on 10/29/2014 08/09/14   Gaynelle Arabian, MD  polycarbophil (FIBERCON) 625 MG tablet Take 1 tablet (625 mg total) by mouth daily. 10/29/14   Leo Grosser, MD   BP 119/70 mmHg  Pulse 79  Temp(Src) 97.8 F (36.6 C) (Oral)  Resp 18  SpO2 99% Physical Exam  Constitutional: She is oriented to person, place, and time. She appears well-developed and well-nourished. No distress.  HENT:  Head: Normocephalic.  Eyes: Conjunctivae are normal.  Neck: Neck supple. No tracheal deviation present.  Cardiovascular: Normal rate, regular rhythm and normal heart sounds.   Pulmonary/Chest: Effort normal and breath sounds normal. No respiratory distress.  Abdominal: Soft. She exhibits no distension. There is no tenderness. There is no rebound and no guarding.  Neurological: She is alert and oriented to person, place, and time.  Skin: Skin is warm and dry.  Psychiatric: She has a normal mood and affect.    ED Course  Procedures (including critical care time) Labs Review Labs Reviewed  COMPREHENSIVE METABOLIC PANEL - Abnormal; Notable for the following:    Glucose, Bld 102 (*)    All other components within normal limits  CBC - Abnormal; Notable for the following:     MCHC 36.3 (*)    All other components within normal limits  TYPE AND SCREEN    Imaging Review No results found. I have personally reviewed and evaluated these images and lab results as part of my medical decision-making.   EKG Interpretation None      MDM   Final diagnoses:  Antibiotic-associated diarrhea   64 y.o. female presents with diarrhea since recently completing a course of omnicef for presumed bronchitis with ongoing cough. Likely related to ABx therapy with unclear indication disrupting gut flora. Had small amount of blood in stool, resolved, Hb stable and hemodynamically stable. Pt not having watery stools but with recent treatment will offer screening for c.diff to assess risk. Unable to provide sample. Recommended probiotics, stool bulking agents, other supportive care measures  as Pt is not septic and no imaging is currently indicated nor is any further inpatient workup. Plan to follow up with PCP as needed and return precautions discussed for worsening or new concerning symptoms.     Leo Grosser, MD 10/30/14 [phone]

## 2014-11-02 ENCOUNTER — Ambulatory Visit
Admission: RE | Admit: 2014-11-02 | Discharge: 2014-11-02 | Disposition: A | Discharge status: Home / Self Care | Payer: BLUE CROSS/BLUE SHIELD | Source: Ambulatory Visit | Attending: Gynecology | Admitting: Gynecology

## 2014-11-02 DIAGNOSIS — R928 Other abnormal and inconclusive findings on diagnostic imaging of breast: Secondary | ICD-10-CM

## 2015-01-06 ENCOUNTER — Other Ambulatory Visit: Payer: Self-pay | Admitting: Gynecology

## 2015-01-06 DIAGNOSIS — N6489 Other specified disorders of breast: Secondary | ICD-10-CM

## 2015-04-27 ENCOUNTER — Other Ambulatory Visit: Payer: BLUE CROSS/BLUE SHIELD

## 2015-05-05 ENCOUNTER — Ambulatory Visit
Admission: RE | Admit: 2015-05-05 | Discharge: 2015-05-05 | Disposition: A | Discharge status: Home / Self Care | Payer: BLUE CROSS/BLUE SHIELD | Source: Ambulatory Visit | Attending: Gynecology | Admitting: Gynecology

## 2015-05-05 DIAGNOSIS — N6489 Other specified disorders of breast: Secondary | ICD-10-CM

## 2015-05-09 ENCOUNTER — Inpatient Hospital Stay: Admit: 2015-05-09 | Payer: BLUE CROSS/BLUE SHIELD | Admitting: Orthopedic Surgery

## 2015-05-09 SURGERY — ARTHROPLASTY, KNEE, TOTAL
Anesthesia: Choice | Site: Knee | Laterality: Right

## 2015-06-08 ENCOUNTER — Other Ambulatory Visit: Payer: BLUE CROSS/BLUE SHIELD

## 2015-09-12 ENCOUNTER — Other Ambulatory Visit: Payer: Self-pay | Admitting: Gynecology

## 2015-09-12 DIAGNOSIS — R928 Other abnormal and inconclusive findings on diagnostic imaging of breast: Secondary | ICD-10-CM

## 2015-09-29 ENCOUNTER — Ambulatory Visit: Payer: Self-pay | Admitting: Orthopedic Surgery

## 2015-10-02 DIAGNOSIS — H6690 Otitis media, unspecified, unspecified ear: Secondary | ICD-10-CM

## 2015-10-02 HISTORY — DX: Otitis media, unspecified, unspecified ear: H66.90

## 2015-10-06 NOTE — Patient Instructions (Addendum)
NATALLIA KAPNER  10/06/2015   Your procedure is scheduled on: 10/17/15  Report to [facility]  Entrance take [place]  elevators to 3rd floor to  [place] [facility] [place]  AM.  Call this number if you have problems the morning of surgery [phone]   Remember: ONLY 1 PERSON MAY GO WITH YOU TO SHORT STAY TO GET  READY MORNING OF YOUR SURGERY.  Do not eat food or drink liquids :After Midnight.     Take these medicines the morning of surgery with A SIP OF WATER: Amlodipine(norvasc), Bupropion(Wellbutrin)                                You may not have any metal on your body including hair pins and              piercings  Do not wear jewelry, make-up, lotions, powders or perfumes, deodorant             Do not wear nail polish.  Do not shave  48 hours prior to surgery.              Men may shave face and neck.   Do not bring valuables to the hospital. [place].  Contacts, dentures or bridgework may not be worn into surgery.  Leave suitcase in the car. After surgery it may be brought to your room.   _____________________________________________________________________             [facility] - Preparing for Surgery--------USE DIAL ANTIBACTERIAL SOAP INSTEAD OF CHLOROHEXADINE---FOLLOW SAME INSTRUCTIONS Before surgery, you can play an important role.  Because skin is not sterile, your skin needs to be as free of germs as possible.  You can reduce the number of germs on your skin by washing with CHG (chlorahexidine gluconate) soap before surgery.  CHG is an antiseptic cleaner which kills germs and bonds with the skin to continue killing germs even after washing. Please DO NOT use if you have an allergy to CHG or antibacterial soaps.  If your skin becomes reddened/irritated stop using the CHG and inform your nurse when you arrive at Short Stay. Do not shave (including legs and underarms) for at least 48 hours prior to  the first CHG shower.  You may shave your face/neck. Please follow these instructions carefully:  1.  Shower with CHG Soap the night before surgery and the  morning of Surgery.  2.  If you choose to wash your hair, wash your hair first as usual with your  normal  shampoo.  3.  After you shampoo, rinse your hair and body thoroughly to remove the  shampoo.                           4.  Use CHG as you would any other liquid soap.  You can apply chg directly  to the skin and wash                       Gently with a scrungie or clean washcloth.  5.  Apply the CHG Soap to your body ONLY FROM THE NECK DOWN.   Do not use on face/  open                           Wound or open sores. Avoid contact with eyes, ears mouth and genitals (private parts).                       Wash face,  Genitals (private parts) with your normal soap.             6.  Wash thoroughly, paying special attention to the area where your surgery  will be performed.  7.  Thoroughly rinse your body with warm water from the neck down.  8.  DO NOT shower/wash with your normal soap after using and rinsing off  the CHG Soap.                9.  Pat yourself dry with a clean towel.            10.  Wear clean pajamas.            11.  Place clean sheets on your bed the night of your first shower and do not  sleep with pets. Day of Surgery : Do not apply any lotions/deodorants the morning of surgery.  Please wear clean clothes to the hospital/surgery center.  FAILURE TO FOLLOW THESE INSTRUCTIONS MAY RESULT IN THE CANCELLATION OF YOUR SURGERY PATIENT SIGNATURE_________________________________  NURSE SIGNATURE__________________________________  ________________________________________________________________________  WHAT IS A BLOOD TRANSFUSION? Blood Transfusion Information  A transfusion is the replacement of blood or some of its parts. Blood is made up of multiple cells which provide different functions.  Red blood cells carry oxygen and  are used for blood loss replacement.  White blood cells fight against infection.  Platelets control bleeding.  Plasma helps clot blood.  Other blood products are available for specialized needs, such as hemophilia or other clotting disorders. BEFORE THE TRANSFUSION  Who gives blood for transfusions?   Healthy volunteers who are fully evaluated to make sure their blood is safe. This is blood bank blood. Transfusion therapy is the safest it has ever been in the practice of medicine. Before blood is taken from a donor, a complete history is taken to make sure that person has no history of diseases nor engages in risky social behavior (examples are intravenous drug use or sexual activity with multiple partners). The donor's travel history is screened to minimize risk of transmitting infections, such as malaria. The donated blood is tested for signs of infectious diseases, such as HIV and hepatitis. The blood is then tested to be sure it is compatible with you in order to minimize the chance of a transfusion reaction. If you or a relative donates blood, this is often done in anticipation of surgery and is not appropriate for emergency situations. It takes many days to process the donated blood. RISKS AND COMPLICATIONS Although transfusion therapy is very safe and saves many lives, the main dangers of transfusion include:   Getting an infectious disease.  Developing a transfusion reaction. This is an allergic reaction to something in the blood you were given. Every precaution is taken to prevent this. The decision to have a blood transfusion has been considered carefully by your caregiver before blood is given. Blood is not given unless the benefits outweigh the risks. AFTER THE TRANSFUSION  Right after receiving a blood transfusion, you will usually feel much better and more energetic. This is especially true if your red blood  cells have gotten low (anemic). The transfusion raises the level of the  red blood cells which carry oxygen, and this usually causes an energy increase.  The nurse administering the transfusion will monitor you carefully for complications. HOME CARE INSTRUCTIONS  No special instructions are needed after a transfusion. You may find your energy is better. Speak with your caregiver about any limitations on activity for underlying diseases you may have. SEEK MEDICAL CARE IF:   Your condition is not improving after your transfusion.  You develop redness or irritation at the intravenous (IV) site. SEEK IMMEDIATE MEDICAL CARE IF:  Any of the following symptoms occur over the next 12 hours:  Shaking chills.  You have a temperature by mouth above 102 F (38.9 C), not controlled by medicine.  Chest, back, or muscle pain.  People around you feel you are not acting correctly or are confused.  Shortness of breath or difficulty breathing.  Dizziness and fainting.  You get a rash or develop hives.  You have a decrease in urine output.  Your urine turns a dark color or changes to pink, red, or brown. Any of the following symptoms occur over the next 10 days:  You have a temperature by mouth above 102 F (38.9 C), not controlled by medicine.  Shortness of breath.  Weakness after normal activity.  The white part of the eye turns yellow (jaundice).  You have a decrease in the amount of urine or are urinating less often.  Your urine turns a dark color or changes to pink, red, or brown. Document Released: 12/16/1999 Document Revised: 03/12/2011 Document Reviewed: 08/04/2007 ExitCare Patient Information 2014 [place].  _______________________________________________________________________  Incentive Spirometer  An incentive spirometer is a tool that can help keep your lungs clear and active. This tool measures how well you are filling your lungs with each breath. Taking long deep breaths may help reverse or decrease the chance of developing breathing  (pulmonary) problems (especially infection) following:  A long period of time when you are unable to move or be active. BEFORE THE PROCEDURE   If the spirometer includes an indicator to show your best effort, your nurse or respiratory therapist will set it to a desired goal.  If possible, sit up straight or lean slightly forward. Try not to slouch.  Hold the incentive spirometer in an upright position. INSTRUCTIONS FOR USE  1. Sit on the edge of your bed if possible, or sit up as far as you can in bed or on a chair. 2. Hold the incentive spirometer in an upright position. 3. Breathe out normally. 4. Place the mouthpiece in your mouth and seal your lips tightly around it. 5. Breathe in slowly and as deeply as possible, raising the piston or the ball toward the top of the column. 6. Hold your breath for 3-5 seconds or for as long as possible. Allow the piston or ball to fall to the bottom of the column. 7. Remove the mouthpiece from your mouth and breathe out normally. 8. Rest for a few seconds and repeat Steps 1 through 7 at least 10 times every 1-2 hours when you are awake. Take your time and take a few normal breaths between deep breaths. 9. The spirometer may include an indicator to show your best effort. Use the indicator as a goal to work toward during each repetition. 10. After each set of 10 deep breaths, practice coughing to be sure your lungs are clear. If you have an incision (the cut made  at the time of surgery), support your incision when coughing by placing a pillow or rolled up towels firmly against it. Once you are able to get out of bed, walk around indoors and cough well. You may stop using the incentive spirometer when instructed by your caregiver.  RISKS AND COMPLICATIONS  Take your time so you do not get dizzy or light-headed.  If you are in pain, you may need to take or ask for pain medication before doing incentive spirometry. It is harder to take a deep breath if you  are having pain. AFTER USE  Rest and breathe slowly and easily.  It can be helpful to keep track of a log of your progress. Your caregiver can provide you with a simple table to help with this. If you are using the spirometer at home, follow these instructions: [place] IF:   You are having difficultly using the spirometer.  You have trouble using the spirometer as often as instructed.  Your pain medication is not giving enough relief while using the spirometer.  You develop fever of 100.5 F (38.1 C) or higher. SEEK IMMEDIATE MEDICAL CARE IF:   You cough up bloody sputum that had not been present before.  You develop fever of 102 F (38.9 C) or greater.  You develop worsening pain at or near the incision site. MAKE SURE YOU:   Understand these instructions.  Will watch your condition.  Will get help right away if you are not doing well or get worse. Document Released: 04/30/2006 Document Revised: 03/12/2011 Document Reviewed: 07/01/2006 [facility] Patient Information 2014 [place], [place].   ________________________________________________________________________

## 2015-10-07 ENCOUNTER — Encounter (HOSPITAL_COMMUNITY)
Admission: RE | Admit: 2015-10-07 | Discharge: 2015-10-07 | Disposition: A | Discharge status: Home / Self Care | Payer: BLUE CROSS/BLUE SHIELD | Source: Ambulatory Visit | Attending: Orthopedic Surgery | Admitting: Orthopedic Surgery

## 2015-10-07 ENCOUNTER — Encounter (HOSPITAL_COMMUNITY): Payer: Self-pay

## 2015-10-07 DIAGNOSIS — Z01812 Encounter for preprocedural laboratory examination: Secondary | ICD-10-CM | POA: Diagnosis present

## 2015-10-07 DIAGNOSIS — M1711 Unilateral primary osteoarthritis, right knee: Secondary | ICD-10-CM | POA: Insufficient documentation

## 2015-10-07 HISTORY — DX: Bronchitis, not specified as acute or chronic: J40

## 2015-10-07 HISTORY — DX: Enterocolitis due to Clostridium difficile, not specified as recurrent: A04.72

## 2015-10-07 LAB — CBC
HEMATOCRIT: 43 % (ref 36.0–46.0)
Hemoglobin: 15.2 g/dL — ABNORMAL HIGH (ref 12.0–15.0)
MCH: 32.5 pg (ref 26.0–34.0)
MCHC: 35.3 g/dL (ref 30.0–36.0)
MCV: 91.9 fL (ref 78.0–100.0)
Platelets: 210 10*3/uL (ref 150–400)
RBC: 4.68 MIL/uL (ref 3.87–5.11)
RDW: 13.6 % (ref 11.5–15.5)
WBC: 5.3 10*3/uL (ref 4.0–10.5)

## 2015-10-07 LAB — COMPREHENSIVE METABOLIC PANEL
ALT: 34 U/L (ref 14–54)
AST: 33 U/L (ref 15–41)
Albumin: 3.9 g/dL (ref 3.5–5.0)
Alkaline Phosphatase: 84 U/L (ref 38–126)
Anion gap: 4 — ABNORMAL LOW (ref 5–15)
BILIRUBIN TOTAL: 0.9 mg/dL (ref 0.3–1.2)
BUN: 15 mg/dL (ref 6–20)
CO2: 30 mmol/L (ref 22–32)
CREATININE: 0.65 mg/dL (ref 0.44–1.00)
Calcium: 8.9 mg/dL (ref 8.9–10.3)
Chloride: 104 mmol/L (ref 101–111)
Glucose, Bld: 80 mg/dL (ref 65–99)
Potassium: 4.5 mmol/L (ref 3.5–5.1)
Sodium: 138 mmol/L (ref 135–145)
TOTAL PROTEIN: 6.7 g/dL (ref 6.5–8.1)

## 2015-10-07 LAB — URINALYSIS, ROUTINE W REFLEX MICROSCOPIC
Bilirubin Urine: NEGATIVE
Glucose, UA: NEGATIVE mg/dL
Hgb urine dipstick: NEGATIVE
Ketones, ur: NEGATIVE mg/dL
LEUKOCYTES UA: NEGATIVE
NITRITE: NEGATIVE
PH: 7 (ref 5.0–8.0)
Protein, ur: NEGATIVE mg/dL
SPECIFIC GRAVITY, URINE: 1.003 — AB (ref 1.005–1.030)

## 2015-10-07 LAB — PROTIME-INR
INR: 1.01
PROTHROMBIN TIME: 13.3 s (ref 11.4–15.2)

## 2015-10-07 LAB — SURGICAL PCR SCREEN
MRSA, PCR: NEGATIVE
STAPHYLOCOCCUS AUREUS: NEGATIVE

## 2015-10-07 LAB — APTT: aPTT: 31 seconds (ref 24–36)

## 2015-10-07 NOTE — Progress Notes (Signed)
lov sue drinkard fnp 10/07/15 chart

## 2015-10-10 NOTE — Progress Notes (Signed)
ekg 10/17 chart

## 2015-10-14 ENCOUNTER — Ambulatory Visit: Payer: Self-pay | Admitting: Orthopedic Surgery

## 2015-10-14 NOTE — H&P (Signed)
Danielle Hayes DOB: 1950/02/23 Married / Language: English / Race: White Female Date of Admission:  10/17/2015 CC:  Right Knee Pain History of Present Illness The patient is a 65 year old female who comes in for a preoperative History and Physical. The patient is scheduled for a right total knee arthroplasty to be performed by Dr. Dione Plover. Aluisio, MD at [facility] on 10/17/2015. The patient is a 65 year old female who is over 1 year out from left total knee arthroplasty. The patient states that she is doing well at this time. The pain is under fair control at this time and describe their pain as mild. The patient feels that they are progressing well at this time. The left knee is feeling better at this time. We did that aspiration and all the fluid came back negative for any signs of infection. She says she may have a little swelling now, but everything feels a lot better. She is still anticipating the right knee surgery. The right knee continues to be an issue with pain and dysfunction and she is now ready to get that side replaced. They have been treated conservatively in the past for the above stated problem and despite conservative measures, they continue to have progressive pain and severe functional limitations and dysfunction. They have failed non-operative management including home exercise, medications. It is felt that they would benefit from undergoing total joint replacement. Risks and benefits of the procedure have been discussed with the patient and they elect to proceed with surgery. There are no active contraindications to surgery such as ongoing infection or rapidly progressive neurological disease.  Problem List/Past Medical Acute pain of left knee (M25.562)  Postoperative nausea (R11.0)  Primary osteoarthritis of right knee (M17.11)  Status post total left knee replacement [id number])  Clostridium difficile diarrhea (A04.72)  Past History Osteoarthritis   Hypercholesterolemia  Vertigo  Hypertension  Benign Positional Vertigo  Anxiety Disorder  Allergies  Synvisc *MUSCULOSKELETAL THERAPY AGENTS*  local reaction Hydrocodone-Acetaminophen *ANALGESICS - OPIOID*  "sick" OxyCODONE HCl *ANALGESICS - OPIOID*  hallucinations, confusion Xarelto *ANTICOAGULANTS*  Hives.  Family History  Cancer  brother Congestive Heart Failure  mother Hypertension  First Degree Relatives. brother Osteoarthritis  mother Heart Disease  mother and grandfather mothers side  Social History  Illicit drug use  no Pain Contract  no Tobacco / smoke exposure  no Marital status  married Tobacco use  Never smoker. 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 Suprema Dolphilus Active. Aspirin Adult Low Strength (81MG  Tablet DR, Oral) Active. Omega 3 (Oral) Specific strength unknown - Active. Vitamin D (Oral) Specific strength unknown - Active. Multiple Vitamin (Oral) Active. Triamterene-HCTZ (37.5-25MG  Tablet, Oral) Active. AmLODIPine Besylate (2.5MG  Tablet, Oral) Active. Finac (2% Lotion, External) Active. Estradiol (0.025MG /24HR Patch Weekly, Transdermal) Active. Vagifem (10MCG Tablet, Vaginal) Active. (2x week) Prometrium (200MG  Capsule, Oral) Active. (every 3 months for 12 days) Diclofenac Sodium (50MG  Tablet DR, Oral) Active. Atorvastatin Calcium (80MG  Tablet, Oral) Active. BuPROPion HCl ER (XL) (300MG  Tablet ER 24HR, Oral) Active.  Past Surgical History Arthroscopy of  Knee  bilateral Tonsillectomy  Total Knee Replacement - Left     Review of Systems  General Not Present- Chills, Fatigue, Fever, Memory Loss, Night Sweats, Weight Gain and Weight Loss. Skin Not Present- Eczema, Hives, Itching, Lesions and Rash. HEENT Present- Tinnitus. Not Present- Dentures, Double Vision, Headache, Hearing Loss and Visual Loss. Respiratory Not Present- Allergies, Chronic Cough, Coughing up blood, Shortness of breath at rest and Shortness of breath with exertion. Cardiovascular Not Present- Chest Pain, Difficulty Breathing Lying Down, Murmur, Palpitations, Racing/skipping heartbeats and Swelling. Gastrointestinal Not Present- Abdominal Pain, Bloody Stool, Constipation, Diarrhea, Difficulty Swallowing, Heartburn, Jaundice, Loss of appetitie, Nausea and Vomiting. Female Genitourinary Not Present- Blood in Urine, Discharge, Flank Pain, Incontinence, Painful Urination, Urgency, Urinary frequency, Urinary Retention, Urinating at Night and Weak urinary stream. Musculoskeletal Present- Back Pain and Morning Stiffness. Not Present- Joint Pain, Joint Swelling, Muscle Pain, Muscle Weakness and Spasms. Neurological Not Present- Blackout spells, Difficulty with balance, Dizziness, Paralysis, Tremor and Weakness. Psychiatric Not Present- Insomnia.  Vitals Weight: 135 lb Height: 63in Body Surface Area: 1.64 m Body Mass Index: 23.91 kg/m    Physical Exam General Mental Status -Alert, cooperative and good historian. General Appearance-pleasant, Not in acute distress. Orientation-Oriented X3. Build & Nutrition-Petite, Well nourished and Well developed.  Head and Neck Head-normocephalic, atraumatic . Neck Global Assessment - supple, no bruit auscultated on the right, no bruit auscultated on the left.  Eye Pupil - Bilateral-Regular and Round. Motion - Bilateral-EOMI.  Chest and Lung Exam Auscultation Breath sounds - clear at anterior chest wall and  clear at posterior chest wall. Adventitious sounds - No Adventitious sounds.  Cardiovascular Auscultation Rhythm - Regular rate and rhythm. Heart Sounds - S1 WNL and S2 WNL. Murmurs & Other Heart Sounds - Auscultation of the heart reveals - No Murmurs.  Abdomen Palpation/Percussion Tenderness - Abdomen is non-tender to palpation. Rigidity (guarding) - Abdomen is soft. Auscultation Auscultation of the abdomen reveals - Bowel sounds normal.  Female Genitourinary Note: Not done, not pertinent to present illness   Musculoskeletal Note: On exam, she is alert and oriented, in no apparent distress. Her left knee shows tiny bit of swelling. There is no warmth about the knee, range about 0 to 125 with no instability.   Assessment & Plan Status post total left knee replacement [id number]) Primary osteoarthritis of right knee (M17.11)  Note:Surgical Plans: Right Total Knee Replacement  Disposition: Home  PCP: Dr. Dagmar Hait - Patient has been seen preoperatively and felt to be stable for surgery.  IV TXA  Anesthesia Issues: Predisposition to Nausea  Plan to go straight to outpatient therapy  Signed electronically by Ok Edwards, III PA-C

## 2015-10-16 NOTE — Anesthesia Preprocedure Evaluation (Addendum)
Anesthesia Evaluation  Patient identified by MRN, date of birth, ID band Patient awake    Reviewed: Allergy & Precautions, NPO status , Patient's Chart, lab work & pertinent test results  History of Anesthesia Complications (+) PONV  Airway Mallampati: I  TM Distance: >3 FB Neck ROM: Full    Dental  (+) Teeth Intact, Dental Advisory Given   Pulmonary neg pulmonary ROS,    breath sounds clear to auscultation       Cardiovascular hypertension, + dysrhythmias  Rhythm:Regular Rate:Normal     Neuro/Psych negative neurological ROS     GI/Hepatic negative GI ROS, Neg liver ROS,   Endo/Other  negative endocrine ROS  Renal/GU negative Renal ROS     Musculoskeletal  (+) Arthritis ,   Abdominal   Peds  Hematology negative hematology ROS (+)   Anesthesia Other Findings   Reproductive/Obstetrics                            Anesthesia Physical Anesthesia Plan  ASA: II  Anesthesia Plan: Spinal   Post-op Pain Management:    Induction: Intravenous  Airway Management Planned: Simple Face Mask  Additional Equipment:   Intra-op Plan:   Post-operative Plan:   Informed Consent: I have reviewed the patients History and Physical, chart, labs and discussed the procedure including the risks, benefits and alternatives for the proposed anesthesia with the patient or authorized representative who has indicated his/her understanding and acceptance.     Plan Discussed with: CRNA  Anesthesia Plan Comments:         Anesthesia Quick Evaluation

## 2015-10-17 ENCOUNTER — Encounter (HOSPITAL_COMMUNITY)
Admission: RE | Disposition: A | Discharge status: Home / Self Care | Payer: Self-pay | Source: Ambulatory Visit | Attending: Orthopedic Surgery

## 2015-10-17 ENCOUNTER — Inpatient Hospital Stay (HOSPITAL_COMMUNITY): Payer: BLUE CROSS/BLUE SHIELD | Admitting: Anesthesiology

## 2015-10-17 ENCOUNTER — Inpatient Hospital Stay (HOSPITAL_COMMUNITY)
Admission: RE | Admit: 2015-10-17 | Discharge: 2015-10-19 | DRG: 470 | Disposition: A | Discharge status: Home / Self Care | Payer: BLUE CROSS/BLUE SHIELD | Source: Ambulatory Visit | Attending: Orthopedic Surgery | Admitting: Orthopedic Surgery

## 2015-10-17 ENCOUNTER — Encounter (HOSPITAL_COMMUNITY): Payer: Self-pay | Admitting: *Deleted

## 2015-10-17 DIAGNOSIS — F419 Anxiety disorder, unspecified: Secondary | ICD-10-CM | POA: Diagnosis present

## 2015-10-17 DIAGNOSIS — Z96652 Presence of left artificial knee joint: Secondary | ICD-10-CM | POA: Diagnosis present

## 2015-10-17 DIAGNOSIS — I10 Essential (primary) hypertension: Secondary | ICD-10-CM | POA: Diagnosis present

## 2015-10-17 DIAGNOSIS — M1710 Unilateral primary osteoarthritis, unspecified knee: Secondary | ICD-10-CM | POA: Diagnosis present

## 2015-10-17 DIAGNOSIS — E785 Hyperlipidemia, unspecified: Secondary | ICD-10-CM | POA: Diagnosis present

## 2015-10-17 DIAGNOSIS — Z7982 Long term (current) use of aspirin: Secondary | ICD-10-CM | POA: Diagnosis not present

## 2015-10-17 DIAGNOSIS — M1711 Unilateral primary osteoarthritis, right knee: Secondary | ICD-10-CM | POA: Diagnosis present

## 2015-10-17 DIAGNOSIS — M25561 Pain in right knee: Secondary | ICD-10-CM | POA: Diagnosis present

## 2015-10-17 DIAGNOSIS — M179 Osteoarthritis of knee, unspecified: Secondary | ICD-10-CM | POA: Diagnosis present

## 2015-10-17 HISTORY — PX: TOTAL KNEE ARTHROPLASTY: SHX125

## 2015-10-17 HISTORY — DX: Otitis media, unspecified, unspecified ear: H66.90

## 2015-10-17 LAB — TYPE AND SCREEN
ABO/RH(D): O POS
Antibody Screen: NEGATIVE

## 2015-10-17 SURGERY — ARTHROPLASTY, KNEE, TOTAL
Anesthesia: Spinal | Site: Knee | Laterality: Right

## 2015-10-17 MED ORDER — AMLODIPINE BESYLATE 5 MG PO TABS
2.5000 mg | ORAL_TABLET | Freq: Every morning | ORAL | Status: DC
  Administered 2015-10-18 – 2015-10-19 (×2): 2.5 mg via ORAL
  Filled 2015-10-17 (×2): qty 1

## 2015-10-17 MED ORDER — TRAMADOL HCL 50 MG PO TABS: 50.0000 mg | ORAL_TABLET | Freq: Four times a day (QID) | ORAL | Status: DC | PRN

## 2015-10-17 MED ORDER — HYDROMORPHONE HCL 1 MG/ML IJ SOLN: 0.2500 mg | INTRAMUSCULAR | Status: DC | PRN

## 2015-10-17 MED ORDER — PHENOL 1.4 % MT LIQD: 1.0000 | OROMUCOSAL | Status: DC | PRN

## 2015-10-17 MED ORDER — METOCLOPRAMIDE HCL 5 MG PO TABS: 5.0000 mg | ORAL_TABLET | Freq: Three times a day (TID) | ORAL | Status: DC | PRN

## 2015-10-17 MED ORDER — BUPIVACAINE HCL 0.25 % IJ SOLN
INTRAMUSCULAR | Status: DC | PRN
  Administered 2015-10-17: 30 mL

## 2015-10-17 MED ORDER — DEXAMETHASONE SODIUM PHOSPHATE 10 MG/ML IJ SOLN
10.0000 mg | Freq: Once | INTRAMUSCULAR | Status: AC
  Administered 2015-10-17: 10 mg via INTRAVENOUS

## 2015-10-17 MED ORDER — ACETAMINOPHEN 650 MG RE SUPP: 650.0000 mg | Freq: Four times a day (QID) | RECTAL | Status: DC | PRN

## 2015-10-17 MED ORDER — LEVOFLOXACIN 500 MG PO TABS
500.0000 mg | ORAL_TABLET | Freq: Every day | ORAL | Status: DC
  Administered 2015-10-17 – 2015-10-18 (×2): 500 mg via ORAL
  Filled 2015-10-17 (×2): qty 1

## 2015-10-17 MED ORDER — ONDANSETRON HCL 4 MG PO TABS
4.0000 mg | ORAL_TABLET | Freq: Four times a day (QID) | ORAL | Status: DC | PRN
  Administered 2015-10-18 – 2015-10-19 (×3): 4 mg via ORAL
  Filled 2015-10-17 (×3): qty 1

## 2015-10-17 MED ORDER — BUPIVACAINE HCL (PF) 0.25 % IJ SOLN
INTRAMUSCULAR | Status: AC
  Filled 2015-10-17: qty 30

## 2015-10-17 MED ORDER — SODIUM CHLORIDE 0.9 % IV SOLN
INTRAVENOUS | Status: DC
  Administered 2015-10-17 – 2015-10-18 (×2): via INTRAVENOUS

## 2015-10-17 MED ORDER — ATORVASTATIN CALCIUM 20 MG PO TABS
40.0000 mg | ORAL_TABLET | Freq: Every morning | ORAL | Status: DC
  Administered 2015-10-18 – 2015-10-19 (×2): 40 mg via ORAL
  Filled 2015-10-17 (×3): qty 2

## 2015-10-17 MED ORDER — ONDANSETRON HCL 4 MG/2ML IJ SOLN
INTRAMUSCULAR | Status: AC
  Filled 2015-10-17: qty 2

## 2015-10-17 MED ORDER — ALUM & MAG HYDROXIDE-SIMETH 200-200-20 MG/5ML PO SUSP: 30.0000 mL | ORAL | Status: DC | PRN

## 2015-10-17 MED ORDER — PROMETHAZINE HCL 25 MG/ML IJ SOLN: 12.5000 mg | Freq: Once | INTRAMUSCULAR | Status: DC | PRN

## 2015-10-17 MED ORDER — ONDANSETRON HCL 4 MG/2ML IJ SOLN: 4.0000 mg | Freq: Four times a day (QID) | INTRAMUSCULAR | Status: DC | PRN

## 2015-10-17 MED ORDER — ACETAMINOPHEN 325 MG PO TABS: 650.0000 mg | ORAL_TABLET | Freq: Four times a day (QID) | ORAL | Status: DC | PRN

## 2015-10-17 MED ORDER — METOCLOPRAMIDE HCL 5 MG/ML IJ SOLN: 5.0000 mg | Freq: Three times a day (TID) | INTRAMUSCULAR | Status: DC | PRN

## 2015-10-17 MED ORDER — BUPIVACAINE LIPOSOME 1.3 % IJ SUSP
INTRAMUSCULAR | Status: DC | PRN
  Administered 2015-10-17: 20 mL

## 2015-10-17 MED ORDER — FLEET ENEMA 7-19 GM/118ML RE ENEM: 1.0000 | ENEMA | Freq: Once | RECTAL | Status: DC | PRN

## 2015-10-17 MED ORDER — DEXAMETHASONE SODIUM PHOSPHATE 10 MG/ML IJ SOLN
10.0000 mg | Freq: Once | INTRAMUSCULAR | Status: AC
  Administered 2015-10-18: 10 mg via INTRAVENOUS
  Filled 2015-10-17: qty 1

## 2015-10-17 MED ORDER — LACTATED RINGERS IV SOLN
INTRAVENOUS | Status: DC | PRN
  Administered 2015-10-17 (×2): via INTRAVENOUS

## 2015-10-17 MED ORDER — CEFAZOLIN SODIUM-DEXTROSE 2-4 GM/100ML-% IV SOLN
2.0000 g | INTRAVENOUS | Status: AC
  Administered 2015-10-17: 2 g via INTRAVENOUS
  Filled 2015-10-17: qty 100

## 2015-10-17 MED ORDER — TRIAMTERENE-HCTZ 37.5-25 MG PO TABS
0.5000 | ORAL_TABLET | Freq: Every morning | ORAL | Status: DC
  Administered 2015-10-18: 0.5 via ORAL
  Filled 2015-10-17 (×2): qty 0.5

## 2015-10-17 MED ORDER — SODIUM CHLORIDE 0.9 % IR SOLN
Status: DC | PRN
  Administered 2015-10-17: 1000 mL

## 2015-10-17 MED ORDER — MIDAZOLAM HCL 5 MG/5ML IJ SOLN
INTRAMUSCULAR | Status: DC | PRN
  Administered 2015-10-17: 2 mg via INTRAVENOUS

## 2015-10-17 MED ORDER — CEFAZOLIN SODIUM-DEXTROSE 2-4 GM/100ML-% IV SOLN
INTRAVENOUS | Status: AC
  Filled 2015-10-17: qty 100

## 2015-10-17 MED ORDER — PROPOFOL 500 MG/50ML IV EMUL
INTRAVENOUS | Status: DC | PRN
  Administered 2015-10-17: 100 ug/kg/min via INTRAVENOUS

## 2015-10-17 MED ORDER — METHOCARBAMOL 500 MG PO TABS
500.0000 mg | ORAL_TABLET | Freq: Four times a day (QID) | ORAL | Status: DC | PRN
  Administered 2015-10-18: 500 mg via ORAL
  Filled 2015-10-17: qty 1

## 2015-10-17 MED ORDER — BUPIVACAINE HCL (PF) 0.75 % IJ SOLN
INTRAMUSCULAR | Status: DC | PRN
  Administered 2015-10-17: 1.6 mL

## 2015-10-17 MED ORDER — ACETAMINOPHEN 10 MG/ML IV SOLN
1000.0000 mg | Freq: Once | INTRAVENOUS | Status: AC
  Administered 2015-10-17: 1000 mg via INTRAVENOUS
  Filled 2015-10-17: qty 100

## 2015-10-17 MED ORDER — FENTANYL CITRATE (PF) 100 MCG/2ML IJ SOLN
INTRAMUSCULAR | Status: DC | PRN
  Administered 2015-10-17: 100 ug via INTRAVENOUS

## 2015-10-17 MED ORDER — HYDROMORPHONE HCL 2 MG PO TABS
2.0000 mg | ORAL_TABLET | ORAL | Status: DC | PRN
  Administered 2015-10-17: 4 mg via ORAL
  Administered 2015-10-17: 2 mg via ORAL
  Administered 2015-10-17: 4 mg via ORAL
  Administered 2015-10-17: 2 mg via ORAL
  Administered 2015-10-18 (×2): 4 mg via ORAL
  Filled 2015-10-17: qty 1
  Filled 2015-10-17 (×3): qty 2
  Filled 2015-10-17: qty 1
  Filled 2015-10-17: qty 2

## 2015-10-17 MED ORDER — SODIUM CHLORIDE 0.9 % IJ SOLN
INTRAMUSCULAR | Status: DC | PRN
  Administered 2015-10-17: 30 mL via INTRAVENOUS

## 2015-10-17 MED ORDER — DEXAMETHASONE SODIUM PHOSPHATE 10 MG/ML IJ SOLN
INTRAMUSCULAR | Status: AC
  Filled 2015-10-17: qty 1

## 2015-10-17 MED ORDER — SODIUM CHLORIDE 0.9 % IJ SOLN
INTRAMUSCULAR | Status: AC
  Filled 2015-10-17: qty 50

## 2015-10-17 MED ORDER — PROPOFOL 10 MG/ML IV BOLUS
INTRAVENOUS | Status: AC
  Filled 2015-10-17: qty 40

## 2015-10-17 MED ORDER — MIDAZOLAM HCL 2 MG/2ML IJ SOLN
INTRAMUSCULAR | Status: AC
  Filled 2015-10-17: qty 2

## 2015-10-17 MED ORDER — STERILE WATER FOR IRRIGATION IR SOLN
Status: DC | PRN
  Administered 2015-10-17: 3000 mL

## 2015-10-17 MED ORDER — 0.9 % SODIUM CHLORIDE (POUR BTL) OPTIME
TOPICAL | Status: DC | PRN
  Administered 2015-10-17: 1000 mL

## 2015-10-17 MED ORDER — BUPROPION HCL ER (XL) 150 MG PO TB24
150.0000 mg | ORAL_TABLET | Freq: Every day | ORAL | Status: DC
  Administered 2015-10-18 – 2015-10-19 (×2): 150 mg via ORAL
  Filled 2015-10-17 (×2): qty 1

## 2015-10-17 MED ORDER — BISACODYL 10 MG RE SUPP: 10.0000 mg | Freq: Every day | RECTAL | Status: DC | PRN

## 2015-10-17 MED ORDER — DOCUSATE SODIUM 100 MG PO CAPS
100.0000 mg | ORAL_CAPSULE | Freq: Two times a day (BID) | ORAL | Status: DC
  Administered 2015-10-17 – 2015-10-19 (×4): 100 mg via ORAL
  Filled 2015-10-17 (×4): qty 1

## 2015-10-17 MED ORDER — METHOCARBAMOL 1000 MG/10ML IJ SOLN
500.0000 mg | Freq: Four times a day (QID) | INTRAVENOUS | Status: DC | PRN
  Administered 2015-10-17: 500 mg via INTRAVENOUS
  Filled 2015-10-17: qty 550
  Filled 2015-10-17: qty 5

## 2015-10-17 MED ORDER — FENTANYL CITRATE (PF) 100 MCG/2ML IJ SOLN
INTRAMUSCULAR | Status: AC
  Filled 2015-10-17: qty 2

## 2015-10-17 MED ORDER — ACETAMINOPHEN 10 MG/ML IV SOLN
INTRAVENOUS | Status: AC
  Filled 2015-10-17: qty 100

## 2015-10-17 MED ORDER — TRANEXAMIC ACID 1000 MG/10ML IV SOLN
1000.0000 mg | INTRAVENOUS | Status: AC
  Administered 2015-10-17: 1000 mg via INTRAVENOUS
  Filled 2015-10-17: qty 1100

## 2015-10-17 MED ORDER — LEVOFLOXACIN 500 MG PO TABS: 500.0000 mg | ORAL_TABLET | Freq: Every day | ORAL | Status: DC

## 2015-10-17 MED ORDER — SODIUM CHLORIDE 0.9 % IV SOLN
1000.0000 mg | Freq: Once | INTRAVENOUS | Status: AC
  Administered 2015-10-17: 1000 mg via INTRAVENOUS
  Filled 2015-10-17: qty 1100

## 2015-10-17 MED ORDER — VANCOMYCIN HCL IN DEXTROSE 1-5 GM/200ML-% IV SOLN
1000.0000 mg | Freq: Two times a day (BID) | INTRAVENOUS | Status: AC
  Administered 2015-10-17: 1000 mg via INTRAVENOUS
  Filled 2015-10-17: qty 200

## 2015-10-17 MED ORDER — DIPHENHYDRAMINE HCL 12.5 MG/5ML PO ELIX: 12.5000 mg | ORAL_SOLUTION | ORAL | Status: DC | PRN

## 2015-10-17 MED ORDER — ASPIRIN EC 325 MG PO TBEC
325.0000 mg | DELAYED_RELEASE_TABLET | Freq: Two times a day (BID) | ORAL | Status: DC
  Administered 2015-10-18 – 2015-10-19 (×3): 325 mg via ORAL
  Filled 2015-10-17 (×3): qty 1

## 2015-10-17 MED ORDER — MENTHOL 3 MG MT LOZG: 1.0000 | LOZENGE | OROMUCOSAL | Status: DC | PRN

## 2015-10-17 MED ORDER — BUPIVACAINE LIPOSOME 1.3 % IJ SUSP
20.0000 mL | Freq: Once | INTRAMUSCULAR | Status: DC
  Filled 2015-10-17: qty 20

## 2015-10-17 MED ORDER — POLYETHYLENE GLYCOL 3350 17 G PO PACK: 17.0000 g | PACK | Freq: Every day | ORAL | Status: DC | PRN

## 2015-10-17 MED ORDER — ONDANSETRON HCL 4 MG/2ML IJ SOLN
INTRAMUSCULAR | Status: DC | PRN
  Administered 2015-10-17: 4 mg via INTRAVENOUS

## 2015-10-17 MED ORDER — POVIDONE-IODINE 7.5 % EX SOLN: Freq: Once | CUTANEOUS | Status: DC

## 2015-10-17 SURGICAL SUPPLY — 48 items
BAG DECANTER FOR FLEXI CONT (MISCELLANEOUS) ×2 IMPLANT
BAG ZIPLOCK 12X15 (MISCELLANEOUS) ×2 IMPLANT
BANDAGE ACE 6X5 VEL STRL LF (GAUZE/BANDAGES/DRESSINGS) ×2 IMPLANT
BLADE SAG 18X100X1.27 (BLADE) ×2 IMPLANT
BLADE SAW SGTL 11.0X1.19X90.0M (BLADE) ×2 IMPLANT
BOWL SMART MIX CTS (DISPOSABLE) ×2 IMPLANT
CAPT KNEE TOTAL 3 ATTUNE ×2 IMPLANT
CEMENT HV SMART SET (Cement) ×4 IMPLANT
CLOTH BEACON ORANGE TIMEOUT ST (SAFETY) ×2 IMPLANT
CUFF TOURN SGL QUICK 34 (TOURNIQUET CUFF) ×1
CUFF TRNQT CYL 34X4X40X1 (TOURNIQUET CUFF) ×1 IMPLANT
DECANTER SPIKE VIAL GLASS SM (MISCELLANEOUS) ×2 IMPLANT
DRAPE U-SHAPE 47X51 STRL (DRAPES) ×2 IMPLANT
DRSG ADAPTIC 3X8 NADH LF (GAUZE/BANDAGES/DRESSINGS) ×2 IMPLANT
DRSG PAD ABDOMINAL 8X10 ST (GAUZE/BANDAGES/DRESSINGS) ×2 IMPLANT
DURAPREP 26ML APPLICATOR (WOUND CARE) ×2 IMPLANT
ELECT REM PT RETURN 9FT ADLT (ELECTROSURGICAL) ×2
ELECTRODE REM PT RTRN 9FT ADLT (ELECTROSURGICAL) ×1 IMPLANT
EVACUATOR 1/8 PVC DRAIN (DRAIN) ×2 IMPLANT
GAUZE SPONGE 4X4 12PLY STRL (GAUZE/BANDAGES/DRESSINGS) ×2 IMPLANT
GLOVE BIO SURGEON STRL SZ7.5 (GLOVE) IMPLANT
GLOVE BIO SURGEON STRL SZ8 (GLOVE) ×2 IMPLANT
GLOVE BIOGEL PI IND STRL 6.5 (GLOVE) ×6 IMPLANT
GLOVE BIOGEL PI IND STRL 8 (GLOVE) ×1 IMPLANT
GLOVE BIOGEL PI INDICATOR 6.5 (GLOVE) ×6
GLOVE BIOGEL PI INDICATOR 8 (GLOVE) ×1
GLOVE SURG SS PI 6.5 STRL IVOR (GLOVE) IMPLANT
GOWN STRL REUS W/TWL LRG LVL3 (GOWN DISPOSABLE) ×6 IMPLANT
GOWN STRL REUS W/TWL XL LVL3 (GOWN DISPOSABLE) ×2 IMPLANT
HANDPIECE INTERPULSE COAX TIP (DISPOSABLE) ×1
IMMOBILIZER KNEE 20 (SOFTGOODS) ×2
IMMOBILIZER KNEE 20 THIGH 36 (SOFTGOODS) ×1 IMPLANT
MANIFOLD NEPTUNE II (INSTRUMENTS) ×2 IMPLANT
NS IRRIG 1000ML POUR BTL (IV SOLUTION) ×2 IMPLANT
PACK TOTAL KNEE CUSTOM (KITS) ×2 IMPLANT
PADDING CAST COTTON 6X4 STRL (CAST SUPPLIES) ×2 IMPLANT
POSITIONER SURGICAL ARM (MISCELLANEOUS) ×2 IMPLANT
SET HNDPC FAN SPRY TIP SCT (DISPOSABLE) ×1 IMPLANT
STRIP CLOSURE SKIN 1/2X4 (GAUZE/BANDAGES/DRESSINGS) ×2 IMPLANT
SUT MNCRL AB 4-0 PS2 18 (SUTURE) ×2 IMPLANT
SUT VIC AB 2-0 CT1 27 (SUTURE) ×3
SUT VIC AB 2-0 CT1 TAPERPNT 27 (SUTURE) ×3 IMPLANT
SUT VLOC 180 0 24IN GS25 (SUTURE) ×2 IMPLANT
SYR 50ML LL SCALE MARK (SYRINGE) ×2 IMPLANT
TRAY FOLEY W/METER SILVER 16FR (SET/KITS/TRAYS/PACK) ×2 IMPLANT
WATER STERILE IRR 1500ML POUR (IV SOLUTION) ×2 IMPLANT
WRAP KNEE MAXI GEL POST OP (GAUZE/BANDAGES/DRESSINGS) ×2 IMPLANT
YANKAUER SUCT BULB TIP 10FT TU (MISCELLANEOUS) ×2 IMPLANT

## 2015-10-17 NOTE — Interval H&P Note (Signed)
History and Physical Interval Note:  10/17/2015 6:42 AM  Danielle Hayes  has presented today for surgery, with the diagnosis of RIGHT KNEE OA  The various methods of treatment have been discussed with the patient and family. After consideration of risks, benefits and other options for treatment, the patient has consented to  Procedure(s): RIGHT TOTAL KNEE ARTHROPLASTY (Right) as a surgical intervention .  The patient's history has been reviewed, patient examined, no change in status, stable for surgery.  I have reviewed the patient's chart and labs.  Questions were answered to the patient's satisfaction.     Gearlean Alf

## 2015-10-17 NOTE — Transfer of Care (Signed)
Immediate Anesthesia Transfer of Care Note  Patient: Danielle Hayes  Procedure(s) Performed: Procedure(s): RIGHT TOTAL KNEE ARTHROPLASTY (Right)  Patient Location: PACU  Anesthesia Type:Spinal  Level of Consciousness:  sedated, patient cooperative and responds to stimulation  Airway & Oxygen Therapy:Patient Spontanous Breathing and Patient connected to face mask oxgen  Post-op Assessment:  Report given to PACU RN and Post -op Vital signs reviewed and stable  Post vital signs:  Reviewed and stable  Last Vitals:  Vitals:   10/17/15 0539  BP: (!) 159/96  Pulse: 82  Resp: 18  Temp: 123XX123 C    Complications: No apparent anesthesia complications

## 2015-10-17 NOTE — Op Note (Signed)
OPERATIVE REPORT-TOTAL KNEE ARTHROPLASTY   Pre-operative diagnosis- Osteoarthritis  Right knee(s)  Post-operative diagnosis- Osteoarthritis Right knee(s)  Procedure-  Right  Total Knee Arthroplasty  Surgeon- Dione Plover. Ceriah Kohler, MD  Assistant- Ardeen Jourdain, PA-C   Anesthesia-  Spinal  EBL-* No blood loss amount entered *   Drains Hemovac  Tourniquet time-  Total Tourniquet Time Documented: Thigh (Right) - 32 minutes Total: Thigh (Right) - 32 minutes     Complications- None  Condition-PACU - hemodynamically stable.   Brief Clinical Note  Danielle Hayes is a 65 y.o. year old female with end stage OA of her right knee with progressively worsening pain and dysfunction. She has constant pain, with activity and at rest and significant functional deficits with difficulties even with ADLs. She has had extensive non-op management including analgesics, injections of cortisone and viscosupplements, and home exercise program, but remains in significant pain with significant dysfunction.Radiographs show bone on bone arthritis medial and patellofemoral. She presents now for right Total Knee Arthroplasty.    Procedure in detail---   The patient is brought into the operating room and positioned supine on the operating table. After successful administration of  Spinal,   a tourniquet is placed high on the  Right thigh(s) and the lower extremity is prepped and draped in the usual sterile fashion. Time out is performed by the operating team and then the  Right lower extremity is wrapped in Esmarch, knee flexed and the tourniquet inflated to 300 mmHg.       A midline incision is made with a ten blade through the subcutaneous tissue to the level of the extensor mechanism. A fresh blade is used to make a medial parapatellar arthrotomy. Soft tissue over the proximal medial tibia is subperiosteally elevated to the joint line with a knife and into the semimembranosus bursa with a Cobb elevator. Soft  tissue over the proximal lateral tibia is elevated with attention being paid to avoiding the patellar tendon on the tibial tubercle. The patella is everted, knee flexed 90 degrees and the ACL and PCL are removed. Findings are bone on bone medial and patellofemoral with large global osteophytes.        The drill is used to create a starting hole in the distal femur and the canal is thoroughly irrigated with sterile saline to remove the fatty contents. The 5 degree Right  valgus alignment guide is placed into the femoral canal and the distal femoral cutting block is pinned to remove 9 mm off the distal femur. Resection is made with an oscillating saw.      The tibia is subluxed forward and the menisci are removed. The extramedullary alignment guide is placed referencing proximally at the medial aspect of the tibial tubercle and distally along the second metatarsal axis and tibial crest. The block is pinned to remove 1mm off the more deficient medial  side. Resection is made with an oscillating saw. Size 4is the most appropriate size for the tibia and the proximal tibia is prepared with the modular drill and keel punch for that size.      The femoral sizing guide is placed and size 5 is most appropriate. Rotation is marked off the epicondylar axis and confirmed by creating a rectangular flexion gap at 90 degrees. The size 5 cutting block is pinned in this rotation and the anterior, posterior and chamfer cuts are made with the oscillating saw. The intercondylar block is then placed and that cut is made.  Trial size 4 tibial component, trial size 5 narrow posterior stabilized femur and a 8  mm posterior stabilized rotating platform insert trial is placed. Full extension is achieved with excellent varus/valgus and anterior/posterior balance throughout full range of motion. The patella is everted and thickness measured to be 21  mm. Free hand resection is taken to 12 mm, a 35 template is placed, lug holes are  drilled, trial patella is placed, and it tracks normally. Osteophytes are removed off the posterior femur with the trial in place. All trials are removed and the cut bone surfaces prepared with pulsatile lavage. Cement is mixed and once ready for implantation, the size 4 tibial implant, size  5 narrowposterior stabilized femoral component, and the size 35 patella are cemented in place and the patella is held with the clamp. The trial insert is placed and the knee held in full extension. The Exparel (20 ml mixed with 30 ml saline) and .25% Bupivicaine, are injected into the extensor mechanism, posterior capsule, medial and lateral gutters and subcutaneous tissues.  All extruded cement is removed and once the cement is hard the permanent 8 mm posterior stabilized rotating platform insert is placed into the tibial tray.      The wound is copiously irrigated with saline solution and the extensor mechanism closed over a hemovac drain with #1 V-loc suture. The tourniquet is released for a total tourniquet time of 32  minutes. Flexion against gravity is 140 degrees and the patella tracks normally. Subcutaneous tissue is closed with 2.0 vicryl and subcuticular with running 4.0 Monocryl. The incision is cleaned and dried and steri-strips and a bulky sterile dressing are applied. The limb is placed into a knee immobilizer and the patient is awakened and transported to recovery in stable condition.      Please note that a surgical assistant was a medical necessity for this procedure in order to perform it in a safe and expeditious manner. Surgical assistant was necessary to retract the ligaments and vital neurovascular structures to prevent injury to them and also necessary for proper positioning of the limb to allow for anatomic placement of the prosthesis.   Dione Plover Erlean Mealor, MD    10/17/2015, 8:11 AM

## 2015-10-17 NOTE — H&P (View-Only) (Signed)
Danielle Hayes DOB: 03-01-1950 Married / Language: English / Race: White Female Date of Admission:  10/17/2015 CC:  Right Knee Pain History of Present Illness The patient is a 65 year old female who comes in for a preoperative History and Physical. The patient is scheduled for a right total knee arthroplasty to be performed by Dr. Dione Plover. Aluisio, MD at [facility] on 10/17/2015. The patient is a 65 year old female who is over 1 year out from left total knee arthroplasty. The patient states that she is doing well at this time. The pain is under fair control at this time and describe their pain as mild. The patient feels that they are progressing well at this time. The left knee is feeling better at this time. We did that aspiration and all the fluid came back negative for any signs of infection. She says she may have a little swelling now, but everything feels a lot better. She is still anticipating the right knee surgery. The right knee continues to be an issue with pain and dysfunction and she is now ready to get that side replaced. They have been treated conservatively in the past for the above stated problem and despite conservative measures, they continue to have progressive pain and severe functional limitations and dysfunction. They have failed non-operative management including home exercise, medications. It is felt that they would benefit from undergoing total joint replacement. Risks and benefits of the procedure have been discussed with the patient and they elect to proceed with surgery. There are no active contraindications to surgery such as ongoing infection or rapidly progressive neurological disease.  Problem List/Past Medical Acute pain of left knee (M25.562)  Postoperative nausea (R11.0)  Primary osteoarthritis of right knee (M17.11)  Status post total left knee replacement [id number])  Clostridium difficile diarrhea (A04.72)  Past History Osteoarthritis   Hypercholesterolemia  Vertigo  Hypertension  Benign Positional Vertigo  Anxiety Disorder  Allergies  Synvisc *MUSCULOSKELETAL THERAPY AGENTS*  local reaction Hydrocodone-Acetaminophen *ANALGESICS - OPIOID*  "sick" OxyCODONE HCl *ANALGESICS - OPIOID*  hallucinations, confusion Xarelto *ANTICOAGULANTS*  Hives.  Family History  Cancer  brother Congestive Heart Failure  mother Hypertension  First Degree Relatives. brother Osteoarthritis  mother Heart Disease  mother and grandfather mothers side  Social History  Illicit drug use  no Pain Contract  no Tobacco / smoke exposure  no Marital status  married Tobacco use  Never smoker. 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 Suprema Dolphilus Active. Aspirin Adult Low Strength (81MG  Tablet DR, Oral) Active. Omega 3 (Oral) Specific strength unknown - Active. Vitamin D (Oral) Specific strength unknown - Active. Multiple Vitamin (Oral) Active. Triamterene-HCTZ (37.5-25MG  Tablet, Oral) Active. AmLODIPine Besylate (2.5MG  Tablet, Oral) Active. Finac (2% Lotion, External) Active. Estradiol (0.025MG [id number] Patch Weekly, Transdermal) Active. Vagifem (10MCG Tablet, Vaginal) Active. (2x week) Prometrium (200MG  Capsule, Oral) Active. (every 3 months for 12 days) Diclofenac Sodium (50MG  Tablet DR, Oral) Active. Atorvastatin Calcium (80MG  Tablet, Oral) Active. BuPROPion HCl ER (XL) (300MG  Tablet ER 24HR, Oral) Active.  Past Surgical History Arthroscopy of  Knee  bilateral Tonsillectomy  Total Knee Replacement - Left     Review of Systems  General Not Present- Chills, Fatigue, Fever, Memory Loss, Night Sweats, Weight Gain and Weight Loss. Skin Not Present- Eczema, Hives, Itching, Lesions and Rash. HEENT Present- Tinnitus. Not Present- Dentures, Double Vision, Headache, Hearing Loss and Visual Loss. Respiratory Not Present- Allergies, Chronic Cough, Coughing up blood, Shortness of breath at rest and Shortness of breath with exertion. Cardiovascular Not Present- Chest Pain, Difficulty Breathing Lying Down, Murmur, Palpitations, Racing/skipping heartbeats and Swelling. Gastrointestinal Not Present- Abdominal Pain, Bloody Stool, Constipation, Diarrhea, Difficulty Swallowing, Heartburn, Jaundice, Loss of appetitie, Nausea and Vomiting. Female Genitourinary Not Present- Blood in Urine, Discharge, Flank Pain, Incontinence, Painful Urination, Urgency, Urinary frequency, Urinary Retention, Urinating at Night and Weak urinary stream. Musculoskeletal Present- Back Pain and Morning Stiffness. Not Present- Joint Pain, Joint Swelling, Muscle Pain, Muscle Weakness and Spasms. Neurological Not Present- Blackout spells, Difficulty with balance, Dizziness, Paralysis, Tremor and Weakness. Psychiatric Not Present- Insomnia.  Vitals Weight: 135 lb Height: 63in Body Surface Area: 1.64 m Body Mass Index: 23.91 kg/m    Physical Exam General Mental Status -Alert, cooperative and good historian. General Appearance-pleasant, Not in acute distress. Orientation-Oriented X3. Build & Nutrition-Petite, Well nourished and Well developed.  Head and Neck Head-normocephalic, atraumatic . Neck Global Assessment - supple, no bruit auscultated on the right, no bruit auscultated on the left.  Eye Pupil - Bilateral-Regular and Round. Motion - Bilateral-EOMI.  Chest and Lung Exam Auscultation Breath sounds - clear at anterior chest wall and  clear at posterior chest wall. Adventitious sounds - No Adventitious sounds.  Cardiovascular Auscultation Rhythm - Regular rate and rhythm. Heart Sounds - S1 WNL and S2 WNL. Murmurs & Other Heart Sounds - Auscultation of the heart reveals - No Murmurs.  Abdomen Palpation/Percussion Tenderness - Abdomen is non-tender to palpation. Rigidity (guarding) - Abdomen is soft. Auscultation Auscultation of the abdomen reveals - Bowel sounds normal.  Female Genitourinary Note: Not done, not pertinent to present illness   Musculoskeletal Note: On exam, she is alert and oriented, in no apparent distress. Her left knee shows tiny bit of swelling. There is no warmth about the knee, range about 0 to 125 with no instability.   Assessment & Plan Status post total left knee replacement [id number]) Primary osteoarthritis of right knee (M17.11)  Note:Surgical Plans: Right Total Knee Replacement  Disposition: Home  PCP: Dr. Dagmar Hait - Patient has been seen preoperatively and felt to be stable for surgery.  IV TXA  Anesthesia Issues: Predisposition to Nausea  Plan to go straight to outpatient therapy  Signed electronically by Ok Edwards, III PA-C

## 2015-10-17 NOTE — Anesthesia Procedure Notes (Signed)
Spinal  Patient location during procedure: OR Start time: 10/17/2015 7:13 AM End time: 10/17/2015 7:16 AM Staffing Resident/CRNA: Harle Stanford R Performed: resident/CRNA  Preanesthetic Checklist Completed: patient identified, site marked, surgical consent, pre-op evaluation, timeout performed, IV checked, risks and benefits discussed and monitors and equipment checked Spinal Block Patient position: sitting Prep: Betadine Patient monitoring: heart rate, cardiac monitor, continuous pulse ox and blood pressure Approach: midline Location: L3-4 Injection technique: single-shot Needle Needle type: Pencan  Needle gauge: 24 G Needle length: 9 cm Needle insertion depth: 7 cm Assessment Sensory level: T6 Additional Notes Timeout performed. SAB kit dte checked. SAB without difficulty

## 2015-10-17 NOTE — Evaluation (Signed)
Physical Therapy Evaluation Patient Details Name: Danielle Hayes MRN: [id number] DOB: 03/26/1950 Today's Date: 10/17/2015   History of Present Illness  R TKA; Hx: L TKA  Clinical Impression  Pt is s/p TKA resulting in the deficits listed below (see PT Problem List). * Pt will benefit from skilled PT to increase their independence and safety with mobility to allow discharge to the venue listed below.   Amb 27' with RW and min assist today; difficulty with R knee extension, encouraged quad sets; pain 4/10; will continue to follow     Follow Up Recommendations Outpatient PT    Equipment Recommendations  None recommended by PT    Recommendations for Other Services       Precautions / Restrictions Precautions Precautions: Fall;Knee Required Braces or Orthoses: Knee Immobilizer - Right Knee Immobilizer - Right: Discontinue once straight leg raise with < 10 degree lag Restrictions Weight Bearing Restrictions: No Other Position/Activity Restrictions: WBAT      Mobility  Bed Mobility Overal bed mobility: Needs Assistance Bed Mobility: Supine to Sit     Supine to sit: Min assist     General bed mobility comments: assist with RLE  Transfers Overall transfer level: Needs assistance Equipment used: Rolling walker (2 wheeled) Transfers: Sit to/from Stand Sit to Stand: Min assist         General transfer comment: cues for hand placement and RLE position  Ambulation/Gait Ambulation/Gait assistance: Min assist;Min guard Ambulation Distance (Feet): 45 Feet Assistive device: Rolling walker (2 wheeled) Gait Pattern/deviations: Step-to pattern;Antalgic     General Gait Details: cues for sequence  Stairs            Wheelchair Mobility    Modified Rankin (Stroke Patients Only)       Balance                                             Pertinent Vitals/Pain Pain Assessment: 0-10 Pain Score: 4  Pain Location: right knee Pain  Intervention(s): Limited activity within patient's tolerance;Monitored during session;Premedicated before session    Home Living Family/patient expects to be discharged to:: Private residence Living Arrangements: Spouse/significant other Available Help at Discharge: Family Type of Home: House Home Access: Stairs to enter Entrance Stairs-Rails: None Entrance Stairs-Number of Steps: 2 Home Layout: One level Home Equipment: Environmental consultant - 2 wheels;Bedside commode;Shower seat      Prior Function Level of Independence: Independent               Hand Dominance        Extremity/Trunk Assessment   Upper Extremity Assessment: Defer to OT evaluation           Lower Extremity Assessment: RLE deficits/detail RLE Deficits / Details: AAROM ~25 to 45* flexion; hip flexion with knee extension 2/5, limited by post op pain       Communication   Communication: No difficulties  Cognition Arousal/Alertness: Awake/alert Behavior During Therapy: WFL for tasks assessed/performed Overall Cognitive Status: Within Functional Limits for tasks assessed                      General Comments      Exercises Total Joint Exercises Ankle Circles/Pumps: AROM;Both;10 reps Quad Sets: 5 reps;AROM;Both   Assessment/Plan    PT Assessment Patient needs continued PT services  PT Problem List Decreased strength;Decreased range of motion;Decreased activity tolerance;Decreased mobility;Decreased  knowledge of use of DME;Pain          PT Treatment Interventions DME instruction;Gait training;Functional mobility training;Therapeutic activities;Therapeutic exercise;Stair training;Patient/family education    PT Goals (Current goals can be found in the Care Plan section)  Acute Rehab PT Goals Patient Stated Goal: less pain PT Goal Formulation: With patient Time For Goal Achievement: 10/21/15 Potential to Achieve Goals: Good    Frequency 7X/week   Barriers to discharge         Co-evaluation               End of Session Equipment Utilized During Treatment: Gait belt Activity Tolerance: Patient tolerated treatment well Patient left: in chair;with call bell/phone within reach;with chair alarm set Nurse Communication: Mobility status         Time: [id number] PT Time Calculation (min) (ACUTE ONLY): 25 min   Charges:   PT Evaluation $PT Eval Low Complexity: 1 Procedure PT Treatments $Gait Training: 8-22 mins   PT G Codes:        Danielle Hayes 2015-10-28, 2:24 PM

## 2015-10-17 NOTE — Anesthesia Postprocedure Evaluation (Signed)
Anesthesia Post Note  Patient: Danielle Hayes  Procedure(s) Performed: Procedure(s) (LRB): RIGHT TOTAL KNEE ARTHROPLASTY (Right)  Patient location during evaluation: Endoscopy Anesthesia Type: General Level of consciousness: awake and alert Pain management: pain level controlled Vital Signs Assessment: post-procedure vital signs reviewed and stable Respiratory status: spontaneous breathing, nonlabored ventilation, respiratory function stable and patient connected to nasal cannula oxygen Cardiovascular status: blood pressure returned to baseline and stable Postop Assessment: no signs of nausea or vomiting Anesthetic complications: no    Last Vitals:  Vitals:   10/17/15 0900 10/17/15 0915  BP: 124/85   Pulse: 65 70  Resp: 15 16  Temp:      Last Pain:  Vitals:   10/17/15 0539  TempSrc: Oral  PainSc:                  [name],JAMES TERRILL

## 2015-10-18 LAB — BASIC METABOLIC PANEL
ANION GAP: 4 — AB (ref 5–15)
BUN: 12 mg/dL (ref 6–20)
CALCIUM: 8.3 mg/dL — AB (ref 8.9–10.3)
CO2: 30 mmol/L (ref 22–32)
CREATININE: 0.5 mg/dL (ref 0.44–1.00)
Chloride: 105 mmol/L (ref 101–111)
GFR calc Af Amer: >60 mL/min (ref >60)
GLUCOSE: 146 mg/dL — AB (ref 65–99)
Potassium: 3.9 mmol/L (ref 3.5–5.1)
Sodium: 139 mmol/L (ref 135–145)

## 2015-10-18 LAB — CBC
HCT: 38.7 % (ref 36.0–46.0)
Hemoglobin: 13.6 g/dL (ref 12.0–15.0)
MCH: 31.8 pg (ref 26.0–34.0)
MCHC: 35.1 g/dL (ref 30.0–36.0)
MCV: 90.4 fL (ref 78.0–100.0)
PLATELETS: 215 10*3/uL (ref 150–400)
RBC: 4.28 MIL/uL (ref 3.87–5.11)
RDW: 13 % (ref 11.5–15.5)
WBC: 12.6 10*3/uL — AB (ref 4.0–10.5)

## 2015-10-18 MED ORDER — ASPIRIN 325 MG PO TBEC: 325.0000 mg | DELAYED_RELEASE_TABLET | Freq: Two times a day (BID) | ORAL | 0 refills | Status: DC

## 2015-10-18 MED ORDER — HYDROMORPHONE HCL 2 MG PO TABS: 1.0000 mg | ORAL_TABLET | ORAL | 0 refills | Status: DC | PRN

## 2015-10-18 MED ORDER — METHOCARBAMOL 500 MG PO TABS: 500.0000 mg | ORAL_TABLET | Freq: Four times a day (QID) | ORAL | 0 refills | Status: DC | PRN

## 2015-10-18 MED ORDER — HYDROMORPHONE HCL 2 MG PO TABS
1.0000 mg | ORAL_TABLET | ORAL | Status: DC | PRN
  Administered 2015-10-18: 2 mg via ORAL
  Filled 2015-10-18: qty 1

## 2015-10-18 MED ORDER — HYDROMORPHONE HCL 2 MG PO TABS
2.0000 mg | ORAL_TABLET | ORAL | Status: DC | PRN
  Administered 2015-10-18: 2 mg via ORAL
  Administered 2015-10-18 (×2): 4 mg via ORAL
  Administered 2015-10-19 (×3): 2 mg via ORAL
  Filled 2015-10-18: qty 1
  Filled 2015-10-18: qty 2
  Filled 2015-10-18 (×2): qty 1
  Filled 2015-10-18 (×2): qty 2

## 2015-10-18 MED ORDER — HYDROMORPHONE HCL 2 MG PO TABS: 2.0000 mg | ORAL_TABLET | ORAL | 0 refills | Status: DC | PRN

## 2015-10-18 MED ORDER — ONDANSETRON HCL 4 MG PO TABS: 4.0000 mg | ORAL_TABLET | Freq: Four times a day (QID) | ORAL | 0 refills | Status: DC | PRN

## 2015-10-18 MED ORDER — TRAMADOL HCL 50 MG PO TABS: 50.0000 mg | ORAL_TABLET | Freq: Four times a day (QID) | ORAL | 1 refills | Status: DC | PRN

## 2015-10-18 MED ORDER — ASPIRIN 325 MG PO TBEC
325.0000 mg | DELAYED_RELEASE_TABLET | Freq: Two times a day (BID) | ORAL | 0 refills | Status: DC
Start: 2015-10-18 — End: 2015-10-18

## 2015-10-18 NOTE — Progress Notes (Signed)
Occupational Therapy Evaluation Patient Details Name: ZENAE WILKENS MRN: [id number] DOB: [birth year] Today's Date: 10/18/2015    History of Present Illness R TKA; Hx: L TKA   Clinical Impression   All OT education completed and pt questions answered. No further OT needs identified; will sign off.    Follow Up Recommendations  No OT follow up;Supervision/Assistance - 24 hour    Equipment Recommendations  None recommended by OT    Recommendations for Other Services       Precautions / Restrictions Precautions Precautions: Fall;Knee Required Braces or Orthoses: Knee Immobilizer - Right Knee Immobilizer - Right: Discontinue once straight leg raise with < 10 degree lag Restrictions Weight Bearing Restrictions: No Other Position/Activity Restrictions: WBAT      Mobility Bed Mobility             Transfers                 Balance                                            ADL Overall ADL's : Needs assistance/impaired                                       General ADL Comments: Patient desired for OT to do a verbal review of all ADL techniques. Educated on LB dressing techniques, toilet transfer, walk-in shower transfer. Demonstrated walk-in shower transfer technique with RW. Patient declined to practice any of it, and states she recently toileted with nursing staff without difficulty. Provided her with handout on shower transfer technique. No further OT needs identified.     Vision     Perception     Praxis      Pertinent Vitals/Pain Pain Assessment: 0-10 Pain Score: 8  Pain Location: right knee Pain Descriptors / Indicators: Aching;Sore;Discomfort;Crying;Guarding;Grimacing Pain Intervention(s): Limited activity within patient's tolerance;Monitored during session;Premedicated before session;Ice applied;Repositioned;Patient requesting pain meds-RN notified     Hand Dominance     Extremity/Trunk Assessment  Upper Extremity Assessment Upper Extremity Assessment: Overall WFL for tasks assessed   Lower Extremity Assessment Lower Extremity Assessment: Defer to PT evaluation       Communication Communication Communication: No difficulties   Cognition Arousal/Alertness: Awake/alert Behavior During Therapy: Anxious Overall Cognitive Status: Within Functional Limits for tasks assessed                     General Comments       Exercises       Shoulder Instructions      Home Living Family/patient expects to be discharged to:: Private residence Living Arrangements: Spouse/significant other Available Help at Discharge: Family Type of Home: House Home Access: Stairs to enter Technical brewer of Steps: 2 Entrance Stairs-Rails: None Home Layout: One level     Bathroom Shower/Tub: Occupational psychologist: Standard Bathroom Accessibility: Yes How Accessible: Accessible via walker Home Equipment: [place] - 2 wheels;Bedside commode;Shower seat   Additional Comments: also has leg lifter      Prior Functioning/Environment Level of Independence: Independent                 OT Problem List: Decreased strength;Decreased range of motion;Pain   OT Treatment/Interventions:      OT Goals(Current goals can be  found in the care plan section) Acute Rehab OT Goals Patient Stated Goal: less pain OT Goal Formulation: All assessment and education complete, DC therapy  OT Frequency:     Barriers to D/C:            Co-evaluation              End of Session Nurse Communication: Mobility status  Activity Tolerance: Patient tolerated treatment well Patient left: in chair;with call bell/phone within reach;with chair alarm set   Time: [id number] OT Time Calculation (min): 14 min Charges:  OT General Charges $OT Visit: 1 Procedure OT Evaluation $OT Eval Low Complexity: 1 Procedure G-Codes:    Rosabel Sermeno A Nov 08, 2015, 1:18 PM

## 2015-10-18 NOTE — Discharge Summary (Signed)
Physician Discharge Summary   Patient ID: Danielle Hayes MRN: [id number] DOB/AGE: 1950-10-12 65 y.o.  Admit date: 10/17/2015 Discharge date: 10-19-2015  Primary Diagnosis:  Osteoarthritis  Right knee(s)  Admission Diagnoses:  Past Medical History:  Diagnosis Date  . Anxiety   . Arthritis   . Bronchitis   . C. difficile diarrhea 10/2014   states was related to antibiotic-? cefdinir  . Complication of anesthesia    has big time phobia from nausea vomting last time got sick was at 65 years old   . Dysrhythmia    PVC's  . Ear infection 10/2015  . Hyperlipidemia   . Hypertension   . PONV (postoperative nausea and vomiting)    Discharge Diagnoses:   Principal Problem:   OA (osteoarthritis) of knee  Estimated body mass index is 25.61 kg/m as calculated from the following:   Height as of this encounter: '5\' 2"'  (1.575 m).   Weight as of this encounter: 63.5 kg (140 lb).  Procedure:  Procedure(s) (LRB): RIGHT TOTAL KNEE ARTHROPLASTY (Right)   Consults: None  HPI: Danielle Hayes is a 65 y.o. year old female with end stage OA of her right knee with progressively worsening pain and dysfunction. She has constant pain, with activity and at rest and significant functional deficits with difficulties even with ADLs. She has had extensive non-op management including analgesics, injections of cortisone and viscosupplements, and home exercise program, but remains in significant pain with significant dysfunction.Radiographs show bone on bone arthritis medial and patellofemoral. She presents now for right Total Knee Arthroplasty.    Laboratory Data: Admission on 10/17/2015  Component Date Value Ref Range Status  . WBC 10/18/2015 12.6* 4.0 - 10.5 K/uL Final  . RBC 10/18/2015 4.28  3.87 - 5.11 MIL/uL Final  . Hemoglobin 10/18/2015 13.6  12.0 - 15.0 g/dL Final  . HCT 10/18/2015 38.7  36.0 - 46.0 % Final  . MCV 10/18/2015 90.4  78.0 - 100.0 fL Final  . MCH 10/18/2015 31.8  26.0 - 34.0 pg Final    . MCHC 10/18/2015 35.1  30.0 - 36.0 g/dL Final  . RDW 10/18/2015 13.0  11.5 - 15.5 % Final  . Platelets 10/18/2015 215  150 - 400 K/uL Final  . Sodium 10/18/2015 139  135 - 145 mmol/L Final  . Potassium 10/18/2015 3.9  3.5 - 5.1 mmol/L Final  . Chloride 10/18/2015 105  101 - 111 mmol/L Final  . CO2 10/18/2015 30  22 - 32 mmol/L Final  . Glucose, Bld 10/18/2015 146* 65 - 99 mg/dL Final  . BUN 10/18/2015 12  6 - 20 mg/dL Final  . Creatinine, Ser 10/18/2015 0.50  0.44 - 1.00 mg/dL Final  . Calcium 10/18/2015 8.3* 8.9 - 10.3 mg/dL Final  . GFR calc non Af Amer 10/18/2015 >60  >60 mL/min Final  . GFR calc Af Amer 10/18/2015 >60  >60 mL/min Final   Comment: (NOTE) The eGFR has been calculated using the CKD EPI equation. This calculation has not been validated in all clinical situations. eGFR's persistently <60 mL/min signify possible Chronic Kidney Disease.   Georgiann Hahn gap 10/18/2015 4* 5 - 15 Final  Hospital Outpatient Visit on 10/07/2015  Component Date Value Ref Range Status  . aPTT 10/07/2015 31  24 - 36 seconds Final  . WBC 10/07/2015 5.3  4.0 - 10.5 K/uL Final  . RBC 10/07/2015 4.68  3.87 - 5.11 MIL/uL Final  . Hemoglobin 10/07/2015 15.2* 12.0 - 15.0 g/dL Final  . HCT 10/07/2015  43.0  36.0 - 46.0 % Final  . MCV 10/07/2015 91.9  78.0 - 100.0 fL Final  . MCH 10/07/2015 32.5  26.0 - 34.0 pg Final  . MCHC 10/07/2015 35.3  30.0 - 36.0 g/dL Final  . RDW 10/07/2015 13.6  11.5 - 15.5 % Final  . Platelets 10/07/2015 210  150 - 400 K/uL Final  . Sodium 10/07/2015 138  135 - 145 mmol/L Final  . Potassium 10/07/2015 4.5  3.5 - 5.1 mmol/L Final  . Chloride 10/07/2015 104  101 - 111 mmol/L Final  . CO2 10/07/2015 30  22 - 32 mmol/L Final  . Glucose, Bld 10/07/2015 80  65 - 99 mg/dL Final  . BUN 10/07/2015 15  6 - 20 mg/dL Final  . Creatinine, Ser 10/07/2015 0.65  0.44 - 1.00 mg/dL Final  . Calcium 10/07/2015 8.9  8.9 - 10.3 mg/dL Final  . Total Protein 10/07/2015 6.7  6.5 - 8.1 g/dL Final   . Albumin 10/07/2015 3.9  3.5 - 5.0 g/dL Final  . AST 10/07/2015 33  15 - 41 U/L Final  . ALT 10/07/2015 34  14 - 54 U/L Final  . Alkaline Phosphatase 10/07/2015 84  38 - 126 U/L Final  . Total Bilirubin 10/07/2015 0.9  0.3 - 1.2 mg/dL Final  . GFR calc non Af Amer 10/07/2015 >60  >60 mL/min Final  . GFR calc Af Amer 10/07/2015 >60  >60 mL/min Final   Comment: (NOTE) The eGFR has been calculated using the CKD EPI equation. This calculation has not been validated in all clinical situations. eGFR's persistently <60 mL/min signify possible Chronic Kidney Disease.   . Anion gap 10/07/2015 4* 5 - 15 Final  . Prothrombin Time 10/07/2015 13.3  11.4 - 15.2 seconds Final  . INR 10/07/2015 1.01   Final  . ABO/RH(D) 10/17/2015 O POS   Final  . Antibody Screen 10/17/2015 NEG   Final  . Sample Expiration 10/17/2015 10/20/2015   Final  . Extend sample reason 10/17/2015 NO TRANSFUSIONS OR PREGNANCY IN THE PAST 3 MONTHS   Final  . Color, Urine 10/07/2015 YELLOW  YELLOW Final  . APPearance 10/07/2015 CLEAR  CLEAR Final  . Specific Gravity, Urine 10/07/2015 1.003* 1.005 - 1.030 Final  . pH 10/07/2015 7.0  5.0 - 8.0 Final  . Glucose, UA 10/07/2015 NEGATIVE  NEGATIVE mg/dL Final  . Hgb urine dipstick 10/07/2015 NEGATIVE  NEGATIVE Final  . Bilirubin Urine 10/07/2015 NEGATIVE  NEGATIVE Final  . Ketones, ur 10/07/2015 NEGATIVE  NEGATIVE mg/dL Final  . Protein, ur 10/07/2015 NEGATIVE  NEGATIVE mg/dL Final  . Nitrite 10/07/2015 NEGATIVE  NEGATIVE Final  . Leukocytes, UA 10/07/2015 NEGATIVE  NEGATIVE Final  . MRSA, PCR 10/07/2015 NEGATIVE  NEGATIVE Final  . Staphylococcus aureus 10/07/2015 NEGATIVE  NEGATIVE Final   Comment:        The Xpert SA Assay (FDA approved for NASAL specimens in patients over 3 years of age), is one component of a comprehensive surveillance program.  Test performance has been validated by [facility] for patients greater than or equal to 42 year old. It is not intended to  diagnose infection nor to guide or monitor treatment.      X-Rays:No results found.  EKG: Orders placed or performed during the hospital encounter of 06/22/14  . EKG 12-Lead  . EKG [facility] Course: Danielle Hayes is a 66 y.o. who was admitted to [facility]. They were brought to the operating room  on 10/17/2015 and underwent Procedure(s): RIGHT TOTAL KNEE ARTHROPLASTY.  Patient tolerated the procedure well and was later transferred to the recovery room and then to the orthopaedic floor for postoperative care.  They were given PO and IV analgesics for pain control following their surgery.  They were given 24 hours of postoperative antibiotics of  Anti-infectives    Start     Dose/Rate Route Frequency Ordered Stop   10/18/15 1000  levofloxacin (LEVAQUIN) tablet 500 mg  Status:  Discontinued     500 mg Oral Daily 10/17/15 1048 10/17/15 1644   10/17/15 2200  levofloxacin (LEVAQUIN) tablet 500 mg     500 mg Oral Daily at bedtime 10/17/15 1644     10/17/15 1900  vancomycin (VANCOCIN) IVPB 1000 mg/200 mL premix     1,000 mg 200 mL/hr over 60 Minutes Intravenous Every 12 hours 10/17/15 1048 10/17/15 1938   10/17/15 0515  ceFAZolin (ANCEF) IVPB 2g/100 mL premix     2 g 200 mL/hr over 30 Minutes Intravenous On call to O.R. 10/17/15 4098 10/17/15 0717     and started on DVT prophylaxis in the form of Aspirin.   PT and OT were ordered for total joint protocol.  Discharge planning consulted to help with postop disposition and equipment needs.  Patient had a decnt night on the evening of surgery.  They started to get up OOB with therapy on day one. Hemovac drain was pulled without difficulty. Pain meds with sedation.  Cut dose in half.  Asking about a nausea pill along with the pain medications.  Ordered and sent home with RX. Continued to work with therapy into day two.  Dressing was changed on day two and the incision was healing well.  Patient was seen in rounds on POD 2, doing  better and was ready to go home.  Discharge home with home health Diet - Cardiac diet Follow up - in 2 weeks Activity - WBAT Disposition - Home Condition Upon Discharge - Good D/C Meds - See DC Summary DVT Prophylaxis - Aspirin  Discharge Instructions    Call MD / Call 911    Complete by:  As directed    If you experience chest pain or shortness of breath, CALL 911 and be transported to the hospital emergency room.  If you develope a fever above 101 F, pus (white drainage) or increased drainage or redness at the wound, or calf pain, call your surgeon's office.   Change dressing    Complete by:  As directed    Change dressing daily with sterile 4 x 4 inch gauze dressing and apply TED hose. Do not submerge the incision under water.   Constipation Prevention    Complete by:  As directed    Drink plenty of fluids.  Prune juice may be helpful.  You may use a stool softener, such as Colace (over the counter) 100 mg twice a day.  Use MiraLax (over the counter) for constipation as needed.   Diet general    Complete by:  As directed    Discharge instructions    Complete by:  As directed    Pick up stool softner and laxative for home use following surgery while on pain medications. Do not submerge incision under water. Please use good hand washing techniques while changing dressing each day. May shower starting three days after surgery. Please use a clean towel to pat the incision dry following showers. Continue to use ice for pain and swelling after surgery. Do  not use any lotions or creams on the incision until instructed by your surgeon.   Postoperative Constipation Protocol  Constipation - defined medically as fewer than three stools per week and severe constipation as less than one stool per week.  One of the most common issues patients have following surgery is constipation.  Even if you have a regular bowel pattern at home, your normal regimen is likely to be disrupted due to  multiple reasons following surgery.  Combination of anesthesia, postoperative narcotics, change in appetite and fluid intake all can affect your bowels.  In order to avoid complications following surgery, here are some recommendations in order to help you during your recovery period.  Colace (docusate) - Pick up an over-the-counter form of Colace or another stool softener and take twice a day as long as you are requiring postoperative pain medications.  Take with a full glass of water daily.  If you experience loose stools or diarrhea, hold the colace until you stool forms back up.  If your symptoms do not get better within 1 week or if they get worse, check with your doctor.  Dulcolax (bisacodyl) - Pick up over-the-counter and take as directed by the product packaging as needed to assist with the movement of your bowels.  Take with a full glass of water.  Use this product as needed if not relieved by Colace only.   MiraLax (polyethylene glycol) - Pick up over-the-counter to have on hand.  MiraLax is a solution that will increase the amount of water in your bowels to assist with bowel movements.  Take as directed and can mix with a glass of water, juice, soda, coffee, or tea.  Take if you go more than two days without a movement. Do not use MiraLax more than once per day. Call your doctor if you are still constipated or irregular after using this medication for 7 days in a row.  If you continue to have problems with postoperative constipation, please contact the office for further assistance and recommendations.  If you experience "the worst abdominal pain ever" or develop nausea or vomiting, please contact the office immediatly for further recommendations for treatment.   Take a full dose 325 mg Aspirin twice a day for three weeks. Once completed the three weeks of full dose aspirin, then reduce back to the 81 mg Aspirin at home.   Do not put a pillow under the knee. Place it under the heel.     Complete by:  As directed    Do not sit on low chairs, stoools or toilet seats, as it may be difficult to get up from low surfaces    Complete by:  As directed    Driving restrictions    Complete by:  As directed    No driving until released by the physician.   Increase activity slowly as tolerated    Complete by:  As directed    Lifting restrictions    Complete by:  As directed    No lifting until released by the physician.   Patient may shower    Complete by:  As directed    You may shower without a dressing once there is no drainage.  Do not wash over the wound.  If drainage remains, do not shower until drainage stops.   TED hose    Complete by:  As directed    Use stockings (TED hose) for 3 weeks on both leg(s).  You may remove them at night for  sleeping.   Weight bearing as tolerated    Complete by:  As directed    Laterality:  right   Extremity:  Lower       Medication List    STOP taking these medications   ADVIL 200 MG tablet Generic drug:  ibuprofen   aspirin 81 MG tablet Replaced by:  aspirin 325 MG EC tablet   diclofenac 50 MG EC tablet Commonly known as:  VOLTAREN   estradiol 0.075 mg/24hr patch Commonly known as:  CLIMARA - Dosed in mg/24 hr   FINACEA 15 % cream Generic drug:  Azelaic Acid   Glucosamine Sulfate 1000 MG Caps   multivitamin with minerals tablet   Omega-3 Fish Oil 1200 MG Caps   PROBIOTIC ACIDOPHILUS PO   progesterone 200 MG capsule Commonly known as:  PROMETRIUM   VAGIFEM VA   Vitamin D3 5000 UNIT/ML Liqd     TAKE these medications   amLODipine 2.5 MG tablet Commonly known as:  NORVASC Take 2.5 mg by mouth every morning.   aspirin 325 MG EC tablet Take 1 tablet (325 mg total) by mouth 2 (two) times daily. Take a full dose 325 mg Aspirin twice a day for three weeks. Once completed the three weeks of full dose aspirin, then reduce back to the 81 mg Aspirin at home. Replaces:  aspirin 81 MG tablet   atorvastatin 40 MG  tablet Commonly known as:  LIPITOR Take 40 mg by mouth every morning.   buPROPion 150 MG 24 hr tablet Commonly known as:  WELLBUTRIN XL Take 150 mg by mouth daily.   HYDROmorphone 2 MG tablet Commonly known as:  DILAUDID Take 1-2 tablets (2-4 mg total) by mouth every 4 (four) hours as needed for severe pain.   levofloxacin 500 MG tablet Commonly known as:  LEVAQUIN Take 500 mg by mouth daily.   methocarbamol 500 MG tablet Commonly known as:  ROBAXIN Take 1 tablet (500 mg total) by mouth every 6 (six) hours as needed for muscle spasms.   ondansetron 4 MG tablet Commonly known as:  ZOFRAN Take 1 tablet (4 mg total) by mouth every 6 (six) hours as needed for nausea.   traMADol 50 MG tablet Commonly known as:  ULTRAM Take 1-2 tablets (50-100 mg total) by mouth every 6 (six) hours as needed (mild pain).   triamterene-hydrochlorothiazide 37.5-25 MG tablet Commonly known as:  MAXZIDE-25 Take 0.5 tablets by mouth every morning.      Follow-up Information    Gearlean Alf, MD. Schedule an appointment as soon as possible for a visit on 11/01/2015.   Specialty:  Orthopedic Surgery Contact information: [address] [place] [address] [phone]           Signed: Arlee Muslim, PA-C Orthopaedic Surgery 10/18/2015, 11:42 PM

## 2015-10-18 NOTE — Discharge Instructions (Signed)
° °Dr. Frank Aluisio °Total Joint Specialist °[facility] °[address] °[place], [place] [address] °[phone] ° °TOTAL KNEE REPLACEMENT POSTOPERATIVE DIRECTIONS ° °Knee Rehabilitation, Guidelines Following Surgery  °Results after knee surgery are often greatly improved when you follow the exercise, range of motion and muscle strengthening exercises prescribed by your doctor. Safety measures are also important to protect the knee from further injury. Any time any of these exercises cause you to have increased pain or swelling in your knee joint, decrease the amount until you are comfortable again and slowly increase them. If you have problems or questions, call your caregiver or physical therapist for advice.  ° °HOME CARE INSTRUCTIONS  °Remove items at home which could result in a fall. This includes throw rugs or furniture in walking pathways.  °· ICE to the affected knee every three hours for 30 minutes at a time and then as needed for pain and swelling.  Continue to use ice on the knee for pain and swelling from surgery. You may notice swelling that will progress down to the foot and ankle.  This is normal after surgery.  Elevate the leg when you are not up walking on it.   °· Continue to use the breathing machine which will help keep your temperature down.  It is common for your temperature to cycle up and down following surgery, especially at night when you are not up moving around and exerting yourself.  The breathing machine keeps your lungs expanded and your temperature down. °· Do not place pillow under knee, focus on keeping the knee straight while resting ° °DIET °You may resume your previous home diet once your are discharged from the hospital. ° °DRESSING / WOUND CARE / SHOWERING °You may shower 3 days after surgery, but keep the wounds dry during showering.  You may use an occlusive plastic wrap (Press'n Seal for example), NO SOAKING/SUBMERGING IN THE BATHTUB.  If the  bandage gets wet, change with a clean dry gauze.  If the incision gets wet, pat the wound dry with a clean towel. °You may start showering once you are discharged home but do not submerge the incision under water. Just pat the incision dry and apply a dry gauze dressing on daily. °Change the surgical dressing daily and reapply a dry dressing each time. ° °ACTIVITY °Walk with your walker as instructed. °Use walker as long as suggested by your caregivers. °Avoid periods of inactivity such as sitting longer than an hour when not asleep. This helps prevent blood clots.  °You may resume a sexual relationship in one month or when given the OK by your doctor.  °You may return to work once you are cleared by your doctor.  °Do not drive a car for 6 weeks or until released by you surgeon.  °Do not drive while taking narcotics. ° °WEIGHT BEARING °Weight bearing as tolerated with assist device (walker, cane, etc) as directed, use it as long as suggested by your surgeon or therapist, typically at least 4-6 weeks. ° °POSTOPERATIVE CONSTIPATION PROTOCOL °Constipation - defined medically as fewer than three stools per week and severe constipation as less than one stool per week. ° °One of the most common issues patients have following surgery is constipation.  Even if you have a regular bowel pattern at home, your normal regimen is likely to be disrupted due to multiple reasons following surgery.  Combination of anesthesia, postoperative narcotics, change in appetite and fluid intake all can affect your bowels.    In order to avoid complications following surgery, here are some recommendations in order to help you during your recovery period. ° °Colace (docusate) - Pick up an over-the-counter form of Colace or another stool softener and take twice a day as long as you are requiring postoperative pain medications.  Take with a full glass of water daily.  If you experience loose stools or diarrhea, hold the colace until you stool forms  back up.  If your symptoms do not get better within 1 week or if they get worse, check with your doctor. ° °Dulcolax (bisacodyl) - Pick up over-the-counter and take as directed by the product packaging as needed to assist with the movement of your bowels.  Take with a full glass of water.  Use this product as needed if not relieved by Colace only.  ° °MiraLax (polyethylene glycol) - Pick up over-the-counter to have on hand.  MiraLax is a solution that will increase the amount of water in your bowels to assist with bowel movements.  Take as directed and can mix with a glass of water, juice, soda, coffee, or tea.  Take if you go more than two days without a movement. °Do not use MiraLax more than once per day. Call your doctor if you are still constipated or irregular after using this medication for 7 days in a row. ° °If you continue to have problems with postoperative constipation, please contact the office for further assistance and recommendations.  If you experience "the worst abdominal pain ever" or develop nausea or vomiting, please contact the office immediatly for further recommendations for treatment. ° °ITCHING ° If you experience itching with your medications, try taking only a single pain pill, or even half a pain pill at a time.  You can also use Benadryl over the counter for itching or also to help with sleep.  ° °TED HOSE STOCKINGS °Wear the elastic stockings on both legs for three weeks following surgery during the day but you may remove then at night for sleeping. ° °MEDICATIONS °See your medication summary on the “After Visit Summary” that the nursing staff will review with you prior to discharge.  You may have some home medications which will be placed on hold until you complete the course of blood thinner medication.  It is important for you to complete the blood thinner medication as prescribed by your surgeon.  Continue your approved medications as instructed at time of  discharge. ° °PRECAUTIONS °If you experience chest pain or shortness of breath - call 911 immediately for transfer to the hospital emergency department.  °If you develop a fever greater that 101 F, purulent drainage from wound, increased redness or drainage from wound, foul odor from the wound/dressing, or calf pain - CONTACT YOUR SURGEON.   °                                                °FOLLOW-UP APPOINTMENTS °Make sure you keep all of your appointments after your operation with your surgeon and caregivers. You should call the office at the above phone number and make an appointment for approximately two weeks after the date of your surgery or on the date instructed by your surgeon outlined in the "After Visit Summary". ° ° °RANGE OF MOTION AND STRENGTHENING EXERCISES  °Rehabilitation of the knee is important following a knee injury or   an operation. After just a few days of immobilization, the muscles of the thigh which control the knee become weakened and shrink (atrophy). Knee exercises are designed to build up the tone and strength of the thigh muscles and to improve knee motion. Often times heat used for twenty to thirty minutes before working out will loosen up your tissues and help with improving the range of motion but do not use heat for the first two weeks following surgery. These exercises can be done on a training (exercise) mat, on the floor, on a table or on a bed. Use what ever works the best and is most comfortable for you Knee exercises include:  Leg Lifts - While your knee is still immobilized in a splint or cast, you can do straight leg raises. Lift the leg to 60 degrees, hold for 3 sec, and slowly lower the leg. Repeat 10-20 times 2-3 times daily. Perform this exercise against resistance later as your knee gets better.  Quad and Hamstring Sets - Tighten up the muscle on the front of the thigh (Quad) and hold for 5-10 sec. Repeat this 10-20 times hourly. Hamstring sets are done by pushing the  foot backward against an object and holding for 5-10 sec. Repeat as with quad sets.   Leg Slides: Lying on your back, slowly slide your foot toward your buttocks, bending your knee up off the floor (only go as far as is comfortable). Then slowly slide your foot back down until your leg is flat on the floor again.  Angel Wings: Lying on your back spread your legs to the side as far apart as you can without causing discomfort.  A rehabilitation program following serious knee injuries can speed recovery and prevent re-injury in the future due to weakened muscles. Contact your doctor or a physical therapist for more information on knee rehabilitation.   IF YOU ARE TRANSFERRED TO A SKILLED REHAB FACILITY If the patient is transferred to a skilled rehab facility following release from the hospital, a list of the current medications will be sent to the facility for the patient to continue.  When discharged from the skilled rehab facility, please have the facility set up the patient's [place] prior to being released. Also, the skilled facility will be responsible for providing the patient with their medications at time of release from the facility to include their pain medication, the muscle relaxants, and their blood thinner medication. If the patient is still at the rehab facility at time of the two week follow up appointment, the skilled rehab facility will also need to assist the patient in arranging follow up appointment in our office and any transportation needs.  MAKE SURE YOU:  Understand these instructions.  Get help right away if you are not doing well or get worse.    Pick up stool softner and laxative for home use following surgery while on pain medications. Do not submerge incision under water. Please use good hand washing techniques while changing dressing each day. May shower starting three days after surgery. Please use a clean towel to pat the incision dry following  showers. Continue to use ice for pain and swelling after surgery. Do not use any lotions or creams on the incision until instructed by your surgeon.  Take a full dose 325 mg Aspirin twice a day for three weeks. Once completed the three weeks of full dose aspirin, then reduce back to the 81 mg Aspirin at home.

## 2015-10-18 NOTE — Progress Notes (Signed)
   Subjective: 1 Day Post-Op Procedure(s) (LRB): RIGHT TOTAL KNEE ARTHROPLASTY (Right) Patient reports pain as mild.   Patient seen in rounds for Dr. Wynelle Link.  Pain meds with sedation.  Will cut dose in half.  Asking about a nausea pill along with the pain medications.  Ordered and will send home with RX. Patient is well, but has had some minor complaints of pain in the knee, requiring pain medications We will start therapy today.  If they do well with therapy and meets all goals, then will allow home later this afternoon following therapy. Plan is to go Home after hospital stay.  Objective: Vital signs in last 24 hours: Temp:  [97.4 F (36.3 C)-98.5 F (36.9 C)] 98.5 F (36.9 C) (10/17 0600) Pulse Rate:  [63-94] 71 (10/17 0600) Resp:  [13-18] 16 (10/17 0600) BP: (100-142)/(65-90) 142/81 (10/17 0600) SpO2:  [98 %-100 %] 100 % (10/17 0600) Weight:  [63.5 kg (140 lb)] 63.5 kg (140 lb) (10/16 1045)  Intake/Output from previous day:  Intake/Output Summary (Last 24 hours) at 10/18/15 0848 Last data filed at 10/18/15 [id number]  Gross per 24 hour  Intake          3046.25 ml  Output             3985 ml  Net          -938.75 ml    Intake/Output this shift: No intake/output data recorded.  Labs:  Recent Labs  10/18/15 0425  HGB 13.6    Recent Labs  10/18/15 0425  WBC 12.6*  RBC 4.28  HCT 38.7  PLT 215    Recent Labs  10/18/15 0425  NA 139  K 3.9  CL 105  CO2 30  BUN 12  CREATININE 0.50  GLUCOSE 146*  CALCIUM 8.3*   No results for input(s): LABPT, INR in the last 72 hours.  EXAM General - Patient is Alert, Appropriate and Oriented Extremity - Neurovascular intact Sensation intact distally Dorsiflexion/Plantar flexion intact Dressing - dressing C/D/I Motor Function - intact, moving foot and toes well on exam.  Hemovac pulled without difficulty.  Past Medical History:  Diagnosis Date  . Anxiety   . Arthritis   . Bronchitis   . C. difficile diarrhea 10/2014    states was related to antibiotic-? cefdinir  . Complication of anesthesia    has big time phobia from nausea vomting last time got sick was at 65 years old   . Dysrhythmia    PVC's  . Ear infection 10/2015  . Hyperlipidemia   . Hypertension   . PONV (postoperative nausea and vomiting)     Assessment/Plan: 1 Day Post-Op Procedure(s) (LRB): RIGHT TOTAL KNEE ARTHROPLASTY (Right) Principal Problem:   OA (osteoarthritis) of knee  Estimated body mass index is 25.61 kg/m as calculated from the following:   Height as of this encounter: 5\' 2"  (1.575 m).   Weight as of this encounter: 63.5 kg (140 lb). Advance diet Up with therapy Discharge home with home health  DVT Prophylaxis - Aspirin Weight-Bearing as tolerated to right leg D/C O2 and Pulse OX and try on Room Air  If meets goals and able to go home: Discharge home with home health Diet - Cardiac diet Follow up - in 2 weeks Activity - WBAT Disposition - Home Condition Upon Discharge - Good D/C Meds - See DC Summary DVT Prophylaxis - Aspirin  Arlee Muslim, PA-C Orthopaedic Surgery

## 2015-10-18 NOTE — Progress Notes (Signed)
Physical Therapy Treatment Patient Details Name: Danielle Hayes MRN: [id number] DOB: 07/15/1950 Today's Date: 10/18/2015    History of Present Illness R TKA; Hx: L TKA    PT Comments    The patient  Tolerated progression af ambulation and there exercises today. Patient and spouse  Had questions about scarring and OPPT. Deferred them to speak to Dr. Wynelle Link. Continue PT.  Follow Up Recommendations  Outpatient PT     Equipment Recommendations  None recommended by PT    Recommendations for Other Services       Precautions / Restrictions Precautions Precautions: Fall;Knee Required Braces or Orthoses: Knee Immobilizer - Right Knee Immobilizer - Right: Discontinue once straight leg raise with < 10 degree lag Restrictions Weight Bearing Restrictions: (P) No Other Position/Activity Restrictions: (P) WBAT    Mobility  Bed Mobility Overal bed mobility: Needs Assistance Bed Mobility: Supine to Sit;Sit to Supine     Supine to sit: Min assist Sit to supine: Min assist   General bed mobility comments: assist with RLE, using leg lifter  Transfers Overall transfer level: Needs assistance Equipment used: Rolling walker (2 wheeled) Transfers: Sit to/from Stand Sit to Stand: Min assist         General transfer comment: cues for hand placement and RLE position  Ambulation/Gait Ambulation/Gait assistance: Min assist Ambulation Distance (Feet): 130 Feet Assistive device: Rolling walker (2 wheeled) Gait Pattern/deviations: Step-to pattern;Step-through pattern;Decreased step length - right;Decreased stance time - right;Antalgic     General Gait Details: cues for sequence   Stairs            Wheelchair Mobility    Modified Rankin (Stroke Patients Only)       Balance                                    Cognition Arousal/Alertness: Awake/alert Behavior During Therapy: Anxious Overall Cognitive Status: Within Functional Limits for tasks assessed                       Exercises Total Joint Exercises Ankle Circles/Pumps: AROM;Both;10 reps Quad Sets: AROM;Both;10 reps Heel Slides: AAROM;Right;10 reps Straight Leg Raises: AAROM;Right;10 reps    General Comments        Pertinent Vitals/Pain Pain Assessment: (P) 0-10 Pain Score: 8  Pain Location: right knee Pain Descriptors / Indicators: Aching;Sore;Discomfort;Crying;Guarding;Grimacing Pain Intervention(s): Limited activity within patient's tolerance;Monitored during session;Premedicated before session;Ice applied;Repositioned;Patient requesting pain meds-RN notified    Home Living Family/patient expects to be discharged to:: (P) Private residence Living Arrangements: (P) Spouse/significant other Available Help at Discharge: (P) Family Type of Home: (P) House Home Access: (P) Stairs to enter Entrance Stairs-Rails: (P) None Home Layout: (P) One level Home Equipment: (P) Walker - 2 wheels;Bedside commode;Shower seat Additional Comments: (P) also has leg lifter    Prior Function Level of Independence: (P) Independent          PT Goals (current goals can now be found in the care plan section) Progress towards PT goals: Progressing toward goals    Frequency    7X/week      PT Plan Current plan remains appropriate    Co-evaluation             End of Session Equipment Utilized During Treatment: Right knee immobilizer Activity Tolerance: Patient tolerated treatment well Patient left: in bed;with call bell/phone within reach;with family/visitor present     Time: [id number]  PT Time Calculation (min) (ACUTE ONLY): 41 min  Charges:  $Gait Training: 8-22 mins $Therapeutic Exercise: 8-22 mins $Self Care/Home Management: 2022/08/24                    G Codes:      [name] 10/18/2015, 1:15 PM [name] PT [phone]

## 2015-10-18 NOTE — Care Management Note (Addendum)
Case Management Note  Patient Details  Name: APOORVA BUGAY MRN: [id number] Date of Birth: 20-Dec-1950  Subjective/Objective:                  RIGHT TOTAL KNEE ARTHROPLASTY (Right) Action/Plan: Discharge planning Expected Discharge Date:                  Expected Discharge Plan:  Home/Self Care  In-House Referral:     Discharge planning Services  CM Consult  Post Acute Care Choice:  Durable Medical Equipment Choice offered to:  Patient  DME Arranged:  CPM DME Agency:  Other - Comment  HH Arranged:  NA HH Agency:  NA  Status of Service:  In process, will continue to follow  If discussed at Long Length of Stay Meetings, dates discussed:    Additional Comments: CM received callback from [facility] of [place] who states Aluisio has protocol and no settings are necessary; CM relayed this message to [employer].  CM faxed order and face to [phone] and brent states he will arrange for CPM and will contact pt for setup.  No other CM needs were communicated. CM met with pt and spouse of pt to offer choice of home health agency.  Pt decines HH services as she states she already has an outpt PT therapy appointment at [facility] this Thursday October 19.  CM has requested settings for ordered CPM and will monitor to arrange once order has been completed. Dellie Catholic, RN 10/18/2015, 9:35 AM

## 2015-10-18 NOTE — Progress Notes (Signed)
Physical Therapy Treatment Patient Details Name: Danielle Hayes MRN: [id number] DOB: 1950-12-31 Today's Date: 10/18/2015    History of Present Illness R TKA; Hx: L TKA    PT Comments    The patient  Slowly progressing. Plans Dc tomorrow.   Follow Up Recommendations  Outpatient PT     Equipment Recommendations  None recommended by PT    Recommendations for Other Services       Precautions / Restrictions Precautions Precautions: Fall;Knee Required Braces or Orthoses: Knee Immobilizer - Right Knee Immobilizer - Right: Discontinue once straight leg raise with < 10 degree lag Restrictions Weight Bearing Restrictions: No Other Position/Activity Restrictions: WBAT    Mobility  Bed Mobility Overal bed mobility: Needs Assistance Bed Mobility: Supine to Sit;Sit to Supine     Supine to sit: Min assist Sit to supine: Min assist   General bed mobility comments: patient holds the right leg in the air with leg lifter fairly high while getting into the bed., cues for technique that maight  facilitate the activity  Transfers Overall transfer level: Needs assistance Equipment used: Rolling walker (2 wheeled) Transfers: Sit to/from Stand Sit to Stand: Min assist         General transfer comment: cues for hand placement and RLE position  Ambulation/Gait Ambulation/Gait assistance: Min assist Ambulation Distance (Feet): 20 Feet (x2) Assistive device: Rolling walker (2 wheeled) Gait Pattern/deviations: Step-to pattern;Decreased stance time - right;Antalgic     General Gait Details: cues for sequence, did not use RW   Stairs            Wheelchair Mobility    Modified Rankin (Stroke Patients Only)       Balance                                    Cognition Arousal/Alertness: Awake/alert Behavior During Therapy: Anxious Overall Cognitive Status: Within Functional Limits for tasks assessed                      Exercises Total Joint  Exercises Ankle Circles/Pumps: AROM;Both;10 reps Quad Sets: AROM;Both;10 reps Heel Slides: AAROM;Right;10 reps Straight Leg Raises: AAROM;Right;10 reps    General Comments        Pertinent Vitals/Pain Pain Assessment: 0-10 Pain Score: 7  Pain Location: right Pain Descriptors / Indicators: Aching;Discomfort;Grimacing;Guarding;Throbbing;Tightness Pain Intervention(s): Limited activity within patient's tolerance;Premedicated before session;Repositioned;Ice applied    Home Living Family/patient expects to be discharged to:: Private residence Living Arrangements: Spouse/significant other Available Help at Discharge: Family Type of Home: House Home Access: Stairs to enter Entrance Stairs-Rails: None Home Layout: One level Home Equipment: Environmental consultant - 2 wheels;Bedside commode;Shower seat Additional Comments: also has leg lifter    Prior Function Level of Independence: Independent          PT Goals (current goals can now be found in the care plan section) Acute Rehab PT Goals Patient Stated Goal: less pain Progress towards PT goals: Progressing toward goals    Frequency    7X/week      PT Plan Current plan remains appropriate    Co-evaluation             End of Session Equipment Utilized During Treatment: Right knee immobilizer Activity Tolerance: Patient limited by pain Patient left: in bed;with call bell/phone within reach;with bed alarm set     Time: 1459-1514 PT Time Calculation (min) (ACUTE ONLY): 15 min  Charges:  $  Gait Training: 8-22 mins $Therapeutic Exercise: 8-22 mins $Self Care/Home Management: Sep 05, 2022                    G Codes:      [name] 10/18/2015, 4:44 PM

## 2015-10-19 LAB — BASIC METABOLIC PANEL
ANION GAP: 7 (ref 5–15)
BUN: 17 mg/dL (ref 6–20)
CO2: 30 mmol/L (ref 22–32)
Calcium: 8.6 mg/dL — ABNORMAL LOW (ref 8.9–10.3)
Chloride: 101 mmol/L (ref 101–111)
Creatinine, Ser: 0.55 mg/dL (ref 0.44–1.00)
GFR calc non Af Amer: >60 mL/min (ref >60)
GLUCOSE: 123 mg/dL — AB (ref 65–99)
POTASSIUM: 3.8 mmol/L (ref 3.5–5.1)
SODIUM: 138 mmol/L (ref 135–145)

## 2015-10-19 LAB — CBC
HCT: 36.4 % (ref 36.0–46.0)
HEMOGLOBIN: 13.1 g/dL (ref 12.0–15.0)
MCH: 32.8 pg (ref 26.0–34.0)
MCHC: 36 g/dL (ref 30.0–36.0)
MCV: 91 fL (ref 78.0–100.0)
Platelets: 210 10*3/uL (ref 150–400)
RBC: 4 MIL/uL (ref 3.87–5.11)
RDW: 13.4 % (ref 11.5–15.5)
WBC: 12.3 10*3/uL — ABNORMAL HIGH (ref 4.0–10.5)

## 2015-10-19 NOTE — Progress Notes (Signed)
   Subjective: 2 Days Post-Op Procedure(s) (LRB): RIGHT TOTAL KNEE ARTHROPLASTY (Right) Patient reports pain as mild.   Patient seen in rounds for Dr. Wynelle Link.  Doing better today. Patient is well, but has had some minor complaints of pain in the knee, requiring pain medications Patient is ready to go home  Objective: Vital signs in last 24 hours: Temp:  [97.5 F (36.4 C)-98.3 F (36.8 C)] 97.5 F (36.4 C) (10/18 0608) Pulse Rate:  [66-88] 66 (10/18 0608) Resp:  [16-20] 16 (10/18 0608) BP: (123-155)/(74-75) 123/75 (10/18 0608) SpO2:  [95 %-98 %] 98 % (10/18 [id number])  Intake/Output from previous day:  Intake/Output Summary (Last 24 hours) at 10/19/15 0935 Last data filed at 10/19/15 0825  Gross per 24 hour  Intake              840 ml  Output             1800 ml  Net             -960 ml    Intake/Output this shift: Total I/O In: 360 [P.O.:360] Out: -   Labs:  Recent Labs  10/18/15 0425 10/19/15 0415  HGB 13.6 13.1    Recent Labs  10/18/15 0425 10/19/15 0415  WBC 12.6* 12.3*  RBC 4.28 4.00  HCT 38.7 36.4  PLT 215 210    Recent Labs  10/18/15 0425 10/19/15 0415  NA 139 138  K 3.9 3.8  CL 105 101  CO2 30 30  BUN 12 17  CREATININE 0.50 0.55  GLUCOSE 146* 123*  CALCIUM 8.3* 8.6*   No results for input(s): LABPT, INR in the last 72 hours.  EXAM: General - Patient is Alert, Appropriate and Oriented Extremity - Neurovascular intact Sensation intact distally Dorsiflexion/Plantar flexion intact Incision - clean, dry, no drainage Motor Function - intact, moving foot and toes well on exam.   Assessment/Plan: 2 Days Post-Op Procedure(s) (LRB): RIGHT TOTAL KNEE ARTHROPLASTY (Right) Procedure(s) (LRB): RIGHT TOTAL KNEE ARTHROPLASTY (Right) Past Medical History:  Diagnosis Date  . Anxiety   . Arthritis   . Bronchitis   . C. difficile diarrhea 10/2014   states was related to antibiotic-? cefdinir  . Complication of anesthesia    has big time phobia  from nausea vomting last time got sick was at 65 years old   . Dysrhythmia    PVC's  . Ear infection 10/2015  . Hyperlipidemia   . Hypertension   . PONV (postoperative nausea and vomiting)    Principal Problem:   OA (osteoarthritis) of knee  Estimated body mass index is 25.61 kg/m as calculated from the following:   Height as of this encounter: 5\' 2"  (1.575 m).   Weight as of this encounter: 63.5 kg (140 lb). Up with therapy Discharge home with home health Diet - Cardiac diet Follow up - in 2 weeks Activity - WBAT Disposition - Home Condition Upon Discharge - Good D/C Meds - See DC Summary DVT Prophylaxis - Aspirin  Arlee Muslim, PA-C Orthopaedic Surgery 10/19/2015, 9:35 AM

## 2015-10-19 NOTE — Progress Notes (Signed)
Physical Therapy Treatment Patient Details Name: Danielle Hayes MRN: [id number] DOB: [birth year] Today's Date: 10/19/2015    History of Present Illness R TKA; Hx: L TKA    PT Comments    The patient reports pain is less today.  All instruction completed. Plans DC today.  Follow Up Recommendations  Outpatient PT     Equipment Recommendations  None recommended by PT    Recommendations for Other Services       Precautions / Restrictions Precautions Precautions: Fall;Knee Required Braces or Orthoses: Knee Immobilizer - Right Knee Immobilizer - Right: Discontinue once straight leg raise with < 10 degree lag    Mobility  Bed Mobility Overal bed mobility: Modified Independent             General bed mobility comments: uses legt lifter  Transfers Overall transfer level: Needs assistance Equipment used: Rolling walker (2 wheeled) Transfers: Sit to/from Stand Sit to Stand: Supervision         General transfer comment: cues for hand placement and RLE position  Ambulation/Gait Ambulation/Gait assistance: Supervision Ambulation Distance (Feet): 150 Feet Assistive device: Rolling walker (2 wheeled) Gait Pattern/deviations: Step-to pattern;Antalgic     General Gait Details: cues for sequence   Stairs Stairs: Yes Stairs assistance: Min assist Stair Management: Step to pattern;With walker;Backwards Number of Stairs: 2 General stair comments: cues for technique, later instructed spouse  Wheelchair Mobility    Modified Rankin (Stroke Patients Only)       Balance                                    Cognition Arousal/Alertness: Awake/alert                          Exercises Total Joint Exercises Ankle Circles/Pumps: AROM;Both;10 reps Quad Sets: AROM;Both;10 reps Towel Squeeze: AROM;Both;10 reps Heel Slides: AAROM;Right;10 reps Hip ABduction/ADduction: AROM;Right;10 reps Straight Leg Raises: AAROM;Right;10 reps Goniometric ROM:  10-40 right knee flexion    General Comments        Pertinent Vitals/Pain Pain Score: 5  Pain Location: R knee Pain Descriptors / Indicators: Aching Pain Intervention(s): Monitored during session;Premedicated before session;Repositioned;Ice applied    Home Living                      Prior Function            PT Goals (current goals can now be found in the care plan section) Progress towards PT goals: Progressing toward goals    Frequency    7X/week      PT Plan Current plan remains appropriate    Co-evaluation             End of Session Equipment Utilized During Treatment: Right knee immobilizer Activity Tolerance: Patient tolerated treatment well Patient left: in bed;with call bell/phone within reach     Time: 1043-1140 PT Time Calculation (min) (ACUTE ONLY): 57 min  Charges:  $Gait Training: 8-22 mins $Therapeutic Exercise: 23-37 mins $Self Care/Home Management: 8-22                    G Codes:      [name] 10/19/2015, 1:07 PM

## 2015-10-20 NOTE — Progress Notes (Signed)
CM called [employer] rep, Ovidio Hanger who states CPM delivered and pt and family instructed yesterday 10/19/15.  No other CM needs were communicated.

## 2015-11-15 ENCOUNTER — Other Ambulatory Visit: Payer: BLUE CROSS/BLUE SHIELD

## 2015-11-29 ENCOUNTER — Other Ambulatory Visit: Payer: BLUE CROSS/BLUE SHIELD

## 2015-11-30 ENCOUNTER — Ambulatory Visit
Admission: RE | Admit: 2015-11-30 | Discharge: 2015-11-30 | Disposition: A | Discharge status: Home / Self Care | Payer: BLUE CROSS/BLUE SHIELD | Source: Ambulatory Visit | Attending: Gynecology | Admitting: Gynecology

## 2015-11-30 DIAGNOSIS — R928 Other abnormal and inconclusive findings on diagnostic imaging of breast: Secondary | ICD-10-CM

## 2016-10-23 ENCOUNTER — Other Ambulatory Visit: Payer: Self-pay | Admitting: Gynecology

## 2016-10-23 DIAGNOSIS — Z1231 Encounter for screening mammogram for malignant neoplasm of breast: Secondary | ICD-10-CM

## 2016-12-05 ENCOUNTER — Ambulatory Visit
Admission: RE | Admit: 2016-12-05 | Discharge: 2016-12-05 | Disposition: A | Discharge status: Home / Self Care | Payer: BLUE CROSS/BLUE SHIELD | Source: Ambulatory Visit | Attending: Gynecology | Admitting: Gynecology

## 2016-12-05 DIAGNOSIS — Z1231 Encounter for screening mammogram for malignant neoplasm of breast: Secondary | ICD-10-CM

## 2017-02-13 DIAGNOSIS — L578 Other skin changes due to chronic exposure to nonionizing radiation: Secondary | ICD-10-CM | POA: Diagnosis not present

## 2017-02-13 DIAGNOSIS — L818 Other specified disorders of pigmentation: Secondary | ICD-10-CM | POA: Diagnosis not present

## 2017-02-13 DIAGNOSIS — L718 Other rosacea: Secondary | ICD-10-CM | POA: Diagnosis not present

## 2017-02-26 ENCOUNTER — Ambulatory Visit (INDEPENDENT_AMBULATORY_CARE_PROVIDER_SITE_OTHER): Payer: Medicare HMO | Admitting: Sports Medicine

## 2017-02-26 ENCOUNTER — Encounter: Payer: Self-pay | Admitting: Sports Medicine

## 2017-02-26 VITALS — BP 132/84 | Ht 62.0 in | Wt 142.0 lb

## 2017-02-26 DIAGNOSIS — S76311A Strain of muscle, fascia and tendon of the posterior muscle group at thigh level, right thigh, initial encounter: Secondary | ICD-10-CM

## 2017-02-26 NOTE — Progress Notes (Signed)
   Subjective:    Patient ID: Danielle Hayes, female    DOB: [birth year], 67 y.o.   MRN: [id number]  HPI chief complaint: Right hip pain  Very pleasant 67 year old female comes in today complaining of 2 months of posterior right hip pain. Pain began while cross-country skiing in January. She felt a "tweak" at that time and has had persistent discomfort that she localizes to her proximal hamstring. Her pain is most noticeable with power walking and with freestyle swimming. The most uncomfortable position is hip extension. She denies any swelling. She denies bruising. She denies groin pain. No pain at rest. No previous injury. She has tried ibuprofen, icing, heat, and stretching. Stretching does seem to help. She has had a couple of sessions of physical therapy primarily for modalities but has not been instructed in any sort of rehabilitation exercise program. She has no pain with working out on the elliptical or on the spin bike. No numbness or tingling.  Past medical history reviewed Surgical history significant for bilateral total knee arthroplasties. Left knee was done in 2016 and right knee was done in 2017. Medications reviewed Allergies reviewed     Review of Systems As above    Objective:   Physical Exam  Well-developed, well-nourished. No acute distress. Awake alert and oriented 3. Vital signs reviewed  Right hip: Smooth painless hip range of motion with a negative log roll. There is no palpable tenderness at the ischial tuberosity. Common hamstring tendon is intact. No palpable defect. No soft tissue swelling. Patient has good strength at both 90 and 30 of knee flexion. No pain with resisted hip adduction. She does have reproducible pain with hip extension. Neurovascularly intact distally.      Assessment & Plan:   Right hip pain secondary to proximal hamstring strain  Askling exercises and eccentric hamstring curls. Compression shorts with activity. Patient is instructed to  stop stretching for the time being and, instead, focus on her eccentric rehabilitation exercises. She may continue with biking and elliptical but I want her to avoid any activity that causes hamstring pain until follow-up with me in 4 weeks. If she continues to struggle, we could consider ultrasound imaging of the proximal hamstring tendon in anticipation of starting topical nitroglycerin. We could also consider 300 mg of gabapentin at night. Patient is encouraged to call with questions or concerns prior to her follow-up visit.  Total time spent with the patient was 30 minutes with greater than 50% of the time spent in face-to-face consultation discussing her diagnosis and treatment plan.

## 2017-03-26 ENCOUNTER — Ambulatory Visit: Payer: Medicare HMO | Admitting: Sports Medicine

## 2017-03-26 VITALS — BP 112/88 | Ht 62.0 in | Wt 139.0 lb

## 2017-03-26 DIAGNOSIS — S76311D Strain of muscle, fascia and tendon of the posterior muscle group at thigh level, right thigh, subsequent encounter: Secondary | ICD-10-CM | POA: Diagnosis not present

## 2017-03-27 ENCOUNTER — Encounter: Payer: Self-pay | Admitting: Sports Medicine

## 2017-03-27 NOTE — Progress Notes (Signed)
   Subjective:    Patient ID: Danielle Hayes, female    DOB: January 26, 1950, 67 y.o.   MRN: [id number]  HPI   Patient comes in today for follow-up on her right hamstring injury.She is improving but is not completely symptom free. She still has some pain in her proximal hamstring when walking. It is not excruciating. She has been doing her Askling exercises. Her main complaint today is an abnormal gait. Her friends have noticed that she has a strange gait when walking. She would like for me to evaluate that today.   Review of Systems As above    Objective:   Physical Exam  Well-developed, well-nourished. No acute distress. Awake alert and oriented 3. Vital signs reviewed  Right hip: Smooth painless hip range of motion with a negative logroll. Minimal pain at the proximal hamstring. No palpable defect. Good hamstring strength but significant weakness with resisted hip abduction. She has a positive Trendelenburg on the left and a Trendelenburg gait with walking. Equal leg lengths. Neurovascularly intact distally.      Assessment & Plan:   Improving proximal hamstring strain, right hip Trendelenburg gait  Patient will add hip abductor strengthening exercises, single leg external rotation exercises, and hip hikes to her Askling exercises. Follow-up with me again in 6 weeks for reevaluation. I think she can slowly increase activity as tolerated. She needs to continue with her compression shorts with exercise.

## 2017-05-01 DIAGNOSIS — R69 Illness, unspecified: Secondary | ICD-10-CM | POA: Diagnosis not present

## 2017-05-02 ENCOUNTER — Ambulatory Visit (INDEPENDENT_AMBULATORY_CARE_PROVIDER_SITE_OTHER): Payer: Managed Care, Other (non HMO) | Admitting: Sports Medicine

## 2017-05-02 VITALS — BP 110/82 | Ht 62.0 in | Wt 137.0 lb

## 2017-05-02 DIAGNOSIS — M79671 Pain in right foot: Secondary | ICD-10-CM

## 2017-05-02 DIAGNOSIS — M79672 Pain in left foot: Secondary | ICD-10-CM | POA: Diagnosis not present

## 2017-05-02 DIAGNOSIS — S76311D Strain of muscle, fascia and tendon of the posterior muscle group at thigh level, right thigh, subsequent encounter: Secondary | ICD-10-CM | POA: Diagnosis not present

## 2017-05-03 ENCOUNTER — Encounter: Payer: Self-pay | Admitting: Sports Medicine

## 2017-05-03 NOTE — Progress Notes (Signed)
   Subjective:    Patient ID: Danielle Hayes, female    DOB: 09-Nov-1950, 67 y.o.   MRN: [id number]  HPI   Patient comes in today for follow-up on a Right proximal hamstring strain and hip weakness. She is doing very well. She has been compliant with her exercises. She has a Physiological scientist and she works with her several times a week. She has returned to all exercises including walking with minimal pain. She is happy with her progress to date.  She is complaining of some pain and blistering on her left foot. She has a history of a bunionectomy many years ago. She's begun to develop a blister along the plantar aspect of the first MTP joint. She has similar pain on the right foot.   Review of Systems As above    Objective:   Physical Exam  Well-developed, well-nourished. No acute distress. Awake alert and oriented 3. Vital signs reviewed  Right hip: No tenderness to palpation at the proximal hamstring. No palpable defect. No soft tissue swelling. Great strength with resisted knee flexion. The weakness she had with resisted hip abduction on March 26 has improved dramatically. She still has a slightly positive Trendelenburg on the left but her Trendelenburg gait has almost completely resolved. She is neurovascularly intact distally.  Examination of her feet in the standing position shows collapse of the transverse arches bilaterally. She has callus buildup over the plantar medial aspect of the first MTP joint. Small blister. No signs of infection. Good pulses.      Assessment & Plan:   Improved proximal hamstring strain, right hip Resolved Trendelenburg gait Transverse arch collapse, bilateral feet  Patient is doing excellent. She is asking about the possibility of her personal trainer adding weights to her exercises. I explained to her that if she is able to perform the exercises fairly easily, then adding resistance is reasonable. I explained the importance of continuing with these  exercises twice weekly. She understands. For her bilateral transverse arch collapse, I will give her metatarsal pads on green sports insoles. She may move these insoles from one pair of shoes to another as needed. Follow-up with me for ongoing or recalcitrant issues.

## 2017-05-14 DIAGNOSIS — H903 Sensorineural hearing loss, bilateral: Secondary | ICD-10-CM | POA: Diagnosis not present

## 2017-05-14 DIAGNOSIS — H60392 Other infective otitis externa, left ear: Secondary | ICD-10-CM | POA: Diagnosis not present

## 2017-06-11 DIAGNOSIS — I10 Essential (primary) hypertension: Secondary | ICD-10-CM | POA: Diagnosis not present

## 2017-06-11 DIAGNOSIS — H5213 Myopia, bilateral: Secondary | ICD-10-CM | POA: Diagnosis not present

## 2017-06-11 DIAGNOSIS — R82998 Other abnormal findings in urine: Secondary | ICD-10-CM | POA: Diagnosis not present

## 2017-06-11 DIAGNOSIS — E7849 Other hyperlipidemia: Secondary | ICD-10-CM | POA: Diagnosis not present

## 2017-06-17 DIAGNOSIS — Z6825 Body mass index (BMI) 25.0-25.9, adult: Secondary | ICD-10-CM | POA: Diagnosis not present

## 2017-06-17 DIAGNOSIS — Z1389 Encounter for screening for other disorder: Secondary | ICD-10-CM | POA: Diagnosis not present

## 2017-06-17 DIAGNOSIS — I10 Essential (primary) hypertension: Secondary | ICD-10-CM | POA: Diagnosis not present

## 2017-06-17 DIAGNOSIS — Z0000 Encounter for general adult medical examination without abnormal findings: Secondary | ICD-10-CM | POA: Diagnosis not present

## 2017-06-17 DIAGNOSIS — N390 Urinary tract infection, site not specified: Secondary | ICD-10-CM | POA: Diagnosis not present

## 2017-06-17 DIAGNOSIS — Z23 Encounter for immunization: Secondary | ICD-10-CM | POA: Diagnosis not present

## 2017-06-17 DIAGNOSIS — M170 Bilateral primary osteoarthritis of knee: Secondary | ICD-10-CM | POA: Diagnosis not present

## 2017-06-17 DIAGNOSIS — R002 Palpitations: Secondary | ICD-10-CM | POA: Diagnosis not present

## 2017-06-17 DIAGNOSIS — E7849 Other hyperlipidemia: Secondary | ICD-10-CM | POA: Diagnosis not present

## 2017-06-17 DIAGNOSIS — R69 Illness, unspecified: Secondary | ICD-10-CM | POA: Diagnosis not present

## 2017-06-20 DIAGNOSIS — Z1212 Encounter for screening for malignant neoplasm of rectum: Secondary | ICD-10-CM | POA: Diagnosis not present

## 2017-08-07 DIAGNOSIS — Z01419 Encounter for gynecological examination (general) (routine) without abnormal findings: Secondary | ICD-10-CM | POA: Diagnosis not present

## 2017-08-07 DIAGNOSIS — Z79890 Hormone replacement therapy (postmenopausal): Secondary | ICD-10-CM | POA: Diagnosis not present

## 2017-08-07 DIAGNOSIS — L292 Pruritus vulvae: Secondary | ICD-10-CM | POA: Diagnosis not present

## 2017-08-07 DIAGNOSIS — Z6825 Body mass index (BMI) 25.0-25.9, adult: Secondary | ICD-10-CM | POA: Diagnosis not present

## 2017-08-07 DIAGNOSIS — R102 Pelvic and perineal pain: Secondary | ICD-10-CM | POA: Diagnosis not present

## 2017-09-19 DIAGNOSIS — Z6825 Body mass index (BMI) 25.0-25.9, adult: Secondary | ICD-10-CM | POA: Diagnosis not present

## 2017-09-19 DIAGNOSIS — R197 Diarrhea, unspecified: Secondary | ICD-10-CM | POA: Diagnosis not present

## 2017-09-20 DIAGNOSIS — R197 Diarrhea, unspecified: Secondary | ICD-10-CM | POA: Diagnosis not present

## 2017-10-30 ENCOUNTER — Other Ambulatory Visit: Payer: Self-pay | Admitting: Gynecology

## 2017-10-30 DIAGNOSIS — Z1231 Encounter for screening mammogram for malignant neoplasm of breast: Secondary | ICD-10-CM

## 2017-10-30 DIAGNOSIS — R69 Illness, unspecified: Secondary | ICD-10-CM | POA: Diagnosis not present

## 2017-11-04 DIAGNOSIS — R69 Illness, unspecified: Secondary | ICD-10-CM | POA: Diagnosis not present

## 2017-11-22 DIAGNOSIS — D1801 Hemangioma of skin and subcutaneous tissue: Secondary | ICD-10-CM | POA: Diagnosis not present

## 2017-11-22 DIAGNOSIS — L80 Vitiligo: Secondary | ICD-10-CM | POA: Diagnosis not present

## 2017-11-22 DIAGNOSIS — I788 Other diseases of capillaries: Secondary | ICD-10-CM | POA: Diagnosis not present

## 2017-11-22 DIAGNOSIS — L821 Other seborrheic keratosis: Secondary | ICD-10-CM | POA: Diagnosis not present

## 2017-11-22 DIAGNOSIS — L570 Actinic keratosis: Secondary | ICD-10-CM | POA: Diagnosis not present

## 2017-11-22 DIAGNOSIS — L814 Other melanin hyperpigmentation: Secondary | ICD-10-CM | POA: Diagnosis not present

## 2017-12-12 ENCOUNTER — Ambulatory Visit
Admission: RE | Admit: 2017-12-12 | Discharge: 2017-12-12 | Disposition: A | Discharge status: Home / Self Care | Payer: Medicare HMO | Source: Ambulatory Visit | Attending: Gynecology | Admitting: Gynecology

## 2017-12-12 DIAGNOSIS — Z1231 Encounter for screening mammogram for malignant neoplasm of breast: Secondary | ICD-10-CM

## 2017-12-13 ENCOUNTER — Other Ambulatory Visit: Payer: Self-pay | Admitting: Obstetrics and Gynecology

## 2017-12-13 ENCOUNTER — Other Ambulatory Visit: Payer: Self-pay | Admitting: Gynecology

## 2017-12-13 DIAGNOSIS — R928 Other abnormal and inconclusive findings on diagnostic imaging of breast: Secondary | ICD-10-CM

## 2017-12-17 ENCOUNTER — Ambulatory Visit: Payer: Medicare HMO

## 2017-12-17 ENCOUNTER — Ambulatory Visit
Admission: RE | Admit: 2017-12-17 | Discharge: 2017-12-17 | Disposition: A | Discharge status: Home / Self Care | Payer: Medicare HMO | Source: Ambulatory Visit | Attending: Obstetrics and Gynecology | Admitting: Obstetrics and Gynecology

## 2017-12-17 DIAGNOSIS — R922 Inconclusive mammogram: Secondary | ICD-10-CM | POA: Diagnosis not present

## 2017-12-17 DIAGNOSIS — R928 Other abnormal and inconclusive findings on diagnostic imaging of breast: Secondary | ICD-10-CM

## 2018-02-10 ENCOUNTER — Encounter: Payer: Self-pay | Admitting: Physician Assistant

## 2018-02-19 ENCOUNTER — Encounter: Payer: Self-pay | Admitting: Physician Assistant

## 2018-02-19 ENCOUNTER — Other Ambulatory Visit (INDEPENDENT_AMBULATORY_CARE_PROVIDER_SITE_OTHER): Payer: Medicare HMO

## 2018-02-19 ENCOUNTER — Ambulatory Visit: Payer: Medicare HMO | Admitting: Physician Assistant

## 2018-02-19 VITALS — BP 130/70 | HR 73 | Ht 62.0 in | Wt 141.6 lb

## 2018-02-19 DIAGNOSIS — R109 Unspecified abdominal pain: Secondary | ICD-10-CM

## 2018-02-19 DIAGNOSIS — R143 Flatulence: Secondary | ICD-10-CM | POA: Diagnosis not present

## 2018-02-19 DIAGNOSIS — R197 Diarrhea, unspecified: Secondary | ICD-10-CM

## 2018-02-19 LAB — CBC WITH DIFFERENTIAL/PLATELET
Basophils Absolute: 0 10*3/uL (ref 0.0–0.1)
Basophils Relative: 0.7 % (ref 0.0–3.0)
EOS PCT: 1.7 % (ref 0.0–5.0)
Eosinophils Absolute: 0.1 10*3/uL (ref 0.0–0.7)
HCT: 46 % (ref 36.0–46.0)
Hemoglobin: 16.3 g/dL — ABNORMAL HIGH (ref 12.0–15.0)
LYMPHS ABS: 1.3 10*3/uL (ref 0.7–4.0)
Lymphocytes Relative: 30.5 % (ref 12.0–46.0)
MCHC: 35.3 g/dL (ref 30.0–36.0)
MCV: 93.9 fl (ref 78.0–100.0)
MONOS PCT: 9.9 % (ref 3.0–12.0)
Monocytes Absolute: 0.4 10*3/uL (ref 0.1–1.0)
NEUTROS ABS: 2.4 10*3/uL (ref 1.4–7.7)
NEUTROS PCT: 57.2 % (ref 43.0–77.0)
PLATELETS: 211 10*3/uL (ref 150.0–400.0)
RBC: 4.9 Mil/uL (ref 3.87–5.11)
RDW: 13.3 % (ref 11.5–15.5)
WBC: 4.2 10*3/uL (ref 4.0–10.5)

## 2018-02-19 LAB — SEDIMENTATION RATE: Sed Rate: 5 mm/hr (ref 0–30)

## 2018-02-19 LAB — IGA: IgA: 155 mg/dL (ref 68–378)

## 2018-02-19 LAB — C-REACTIVE PROTEIN

## 2018-02-19 MED ORDER — GLYCOPYRROLATE 2 MG PO TABS: 2.0000 mg | ORAL_TABLET | Freq: Two times a day (BID) | ORAL | 3 refills | Status: DC | PRN

## 2018-02-19 NOTE — Patient Instructions (Signed)
Your provider has requested that you go to the basement level for lab work before leaving today. Press "B" on the elevator. The lab is located at the first door on the left as you exit the elevator.  We have sent the following medications to your pharmacy for you to pick up at your convenience: robinul.   Continue your probiotic daily.

## 2018-02-19 NOTE — Progress Notes (Signed)
Subjective:    Patient ID: Danielle Hayes, female    DOB: [birth year], 68 y.o.   MRN: [id number]  HPI [name] is a pleasant 68 year old white female, known to Dr. Henrene Pastor from previous colonoscopy done in 2015 and normal.  She is referred today by Dr. Dagmar Hait for evaluation of persistent loose stools. Patient says she was told she had IBS when she was in her 105s but has not had any problems with her bowels for many years.  She had an episode of C. difficile colitis in 2016 which was precipitated by antibiotics. Her current symptoms had onset in July 2019.  She says that she has a [place] and [place] and when she first started having loose stools thought she had likely picked up a parasite.  She did not have severe diarrhea but just loose stools.  Symptoms calm down without any specific therapy.  She had some associated abdominal pain and cramping and occasional nausea without vomiting. She was seen by her office office in September 2019 and had stool for Giardia done which was negative and stool for C. difficile by toxin also negative, stool for O&P also negative. Now over the past several months he has continued to have loose to very soft stools but generally only has 1-2 bowel movements per day.  She was advised to start a fiber supplement which she did about a month ago and says that seems to help firm up her stools some.  She denies any real abdominal pain but says she is always aware of her abdomen at times has a lot of activity in her bowel.  Still gets occasional cramping and discomfort She has not had any bleeding.  She has noted an increase in gas and belching and malodorous flatus.  Appetite has been okay weight has been stable.  She does not notice any change in symptoms postprandially. She recently took about 5 days worth of metronidazole which she had been given months ago and says she had a lot of GI side effects so did not complete a course and she did not notice any  change in symptoms. She has not been on any new medications, no other recent antibiotics, no foreign travel.  She is taking an over-the-counter probiotic. She also mentions a very sporadic mild dysphasia.  She says if she gets this sensation she gets hiccups.  Again this is been very infrequent.  Review of Systems Pertinent positive and negative review of systems were noted in the above HPI section.  All other review of systems was otherwise negative.  Outpatient Encounter Medications as of 02/19/2018  Medication Sig  . amLODipine (NORVASC) 2.5 MG tablet Take 2.5 mg by mouth every morning.   Marland Kitchen aspirin 81 MG tablet Take 81 mg by mouth daily.  Marland Kitchen atorvastatin (LIPITOR) 40 MG tablet Take 40 mg by mouth every morning.   Marland Kitchen buPROPion (WELLBUTRIN XL) 150 MG 24 hr tablet Take 150 mg by mouth daily.  Marland Kitchen estradiol (VIVELLE-DOT) 0.075 MG/24HR APP 1 PATCH TO SKIN TWICE A WEEK  . progesterone (PROMETRIUM) 200 MG capsule Prometrium 200 mg capsule  1 capsule at hs 1-14 as directed  . psyllium (METAMUCIL) 58.6 % powder Take 1 packet by mouth 3 (three) times daily.  Marland Kitchen triamterene-hydrochlorothiazide (MAXZIDE-25) 37.5-25 MG per tablet Take 0.5 tablets by mouth every morning.   Marland Kitchen glycopyrrolate (ROBINUL) 2 MG tablet Take 1 tablet (2 mg total) by mouth 2 (two) times daily as needed (abdominal cramping).  . [  DISCONTINUED] Azelaic Acid (FINACEA) 15 % cream Finacea 15 % topical gel   No facility-administered encounter medications on file as of 02/19/2018.    Allergies  Allergen Reactions  . Cefdinir Other (See Comments)    c diff  . Chlorhexidine Itching    Itchy and rash  . Hydrocodone Nausea Only  . Oxycodone Other (See Comments)    hallucinations  . Synvisc [Hylan G-F 20] Swelling    Red, could not walk , rooster injections into knee  . Xarelto [Rivaroxaban] Hives   Patient Active Problem List   Diagnosis Date Noted  . Arthrofibrosis of total knee arthroplasty ([place]) 08/09/2014  . OA (osteoarthritis) of  knee 06/28/2014   Social History   Socioeconomic History  . Marital status: Married    Spouse name: Not on file  . Number of children: Not on file  . Years of education: Not on file  . Highest education level: Not on file  Occupational History  . Occupation: retired  Scientific laboratory technician  . Financial resource strain: Not on file  . Food insecurity:    Worry: Not on file    Inability: Not on file  . Transportation needs:    Medical: Not on file    Non-medical: Not on file  Tobacco Use  . Smoking status: Never Smoker  . Smokeless tobacco: Never Used  Substance and Sexual Activity  . Alcohol use: Yes    Alcohol/week: 6.0 standard drinks    Types: 6 Glasses of wine per week    Comment: weekly  . Drug use: No  . Sexual activity: Not on file  Lifestyle  . Physical activity:    Days per week: Not on file    Minutes per session: Not on file  . Stress: Not on file  Relationships  . Social connections:    Talks on phone: Not on file    Gets together: Not on file    Attends religious service: Not on file    Active member of club or organization: Not on file    Attends meetings of clubs or organizations: Not on file    Relationship status: Not on file  . Intimate partner violence:    Fear of current or ex partner: Not on file    Emotionally abused: Not on file    Physically abused: Not on file    Forced sexual activity: Not on file  Other Topics Concern  . Not on file  Social History Narrative  . Not on file    Ms. Beets's family history includes Heart disease in her mother.      Objective:    Vitals:   02/19/18 0824  BP: 130/70  Pulse: 73  SpO2: 98%    Physical Exam; well-developed older white female in no acute distress, pleasant, height 5 foot 2, weight 141, BMI 25.9.  HEENT; nontraumatic normocephalic EOMI PERRLA sclera anicteric, oral mucosa moist, Cardiovascular; regular rate and rhythm with S1-S2 no murmur rub or gallop, Pulmonary; clear bilaterally, Abdomen;  soft, bowel sounds are active, no distention, no palpable mass or hepatosplenomegaly , he has mild bilateral lower quadrant tenderness Rectal; exam not done, Extremities; no clubbing cyanosis or edema skin warm and dry, Neuropsych; alert and oriented, grossly nonfocal mood and affect appropriate       Assessment & Plan:   #70 68 year old white female with 28-month history of change in bowel habits with persistent soft to loose stools with 1-2 bowel movements daily This is been associated with an  increase in gas and malodorous flatus and occasional abdominal crampy discomfort. Stool studies done in September were negative. Etiology of symptoms is not clear, rule out infectious etiology i.e. parasitic, rule out postinfectious IBS.  Rule out mild microcytic colitis.  #2 colon cancer screening-up-to-date with negative colonoscopy July 2015 #3 hypertension  Plan; patient will continue fiber supplement, continue probiotic. Check CBC with differential, sed rate, CRP, TTG/IgA. Stool for lactoferrin and stool for O&P. We will give her a trial of Robinul Forte 2 mg p.o. twice daily.  I have asked her to start with every morning dosing, may use twice daily if needed. If above studies all unrevealing she may require repeat colonoscopy with biopsies to rule out microscopic colitis.   S  PA-C 02/19/2018   Cc: Prince Solian, MD

## 2018-02-20 LAB — TISSUE TRANSGLUTAMINASE, IGA: (tTG) Ab, IgA: 1 U/mL

## 2018-02-20 NOTE — Progress Notes (Signed)
Assessment and plan reviewed 

## 2018-02-21 ENCOUNTER — Other Ambulatory Visit: Payer: Medicare HMO

## 2018-02-21 DIAGNOSIS — R197 Diarrhea, unspecified: Secondary | ICD-10-CM

## 2018-02-21 DIAGNOSIS — R143 Flatulence: Secondary | ICD-10-CM | POA: Diagnosis not present

## 2018-02-21 DIAGNOSIS — R109 Unspecified abdominal pain: Secondary | ICD-10-CM

## 2018-02-24 LAB — FECAL LACTOFERRIN, QUANT
FECAL LACTOFERRIN: NEGATIVE
MICRO NUMBER: 226770
SPECIMEN QUALITY:: ADEQUATE

## 2018-02-24 LAB — GASTROINTESTINAL PATHOGEN PANEL PCR
C. DIFFICILE TOX A/B, PCR: NOT DETECTED
CAMPYLOBACTER, PCR: NOT DETECTED
Cryptosporidium, PCR: NOT DETECTED
E COLI (STEC) STX1/STX2, PCR: NOT DETECTED
E COLI 0157, PCR: NOT DETECTED
E coli (ETEC) LT/ST PCR: NOT DETECTED
Giardia lamblia, PCR: NOT DETECTED
Norovirus, PCR: NOT DETECTED
Rotavirus A, PCR: NOT DETECTED
SALMONELLA, PCR: NOT DETECTED
Shigella, PCR: NOT DETECTED

## 2018-06-09 DIAGNOSIS — H2513 Age-related nuclear cataract, bilateral: Secondary | ICD-10-CM | POA: Diagnosis not present

## 2018-06-20 DIAGNOSIS — Z0100 Encounter for examination of eyes and vision without abnormal findings: Secondary | ICD-10-CM | POA: Diagnosis not present

## 2018-06-23 DIAGNOSIS — R69 Illness, unspecified: Secondary | ICD-10-CM | POA: Diagnosis not present

## 2018-07-28 ENCOUNTER — Encounter: Payer: Self-pay | Admitting: Physician Assistant

## 2018-07-28 DIAGNOSIS — R82998 Other abnormal findings in urine: Secondary | ICD-10-CM | POA: Diagnosis not present

## 2018-07-28 DIAGNOSIS — E7849 Other hyperlipidemia: Secondary | ICD-10-CM | POA: Diagnosis not present

## 2018-07-28 DIAGNOSIS — I10 Essential (primary) hypertension: Secondary | ICD-10-CM | POA: Diagnosis not present

## 2018-08-04 DIAGNOSIS — R197 Diarrhea, unspecified: Secondary | ICD-10-CM | POA: Diagnosis not present

## 2018-08-04 DIAGNOSIS — E785 Hyperlipidemia, unspecified: Secondary | ICD-10-CM | POA: Diagnosis not present

## 2018-08-04 DIAGNOSIS — Z1331 Encounter for screening for depression: Secondary | ICD-10-CM | POA: Diagnosis not present

## 2018-08-04 DIAGNOSIS — N390 Urinary tract infection, site not specified: Secondary | ICD-10-CM | POA: Diagnosis not present

## 2018-08-04 DIAGNOSIS — I10 Essential (primary) hypertension: Secondary | ICD-10-CM | POA: Diagnosis not present

## 2018-08-04 DIAGNOSIS — R69 Illness, unspecified: Secondary | ICD-10-CM | POA: Diagnosis not present

## 2018-08-04 DIAGNOSIS — Z0000 Encounter for general adult medical examination without abnormal findings: Secondary | ICD-10-CM | POA: Diagnosis not present

## 2018-08-04 DIAGNOSIS — R002 Palpitations: Secondary | ICD-10-CM | POA: Diagnosis not present

## 2018-08-04 DIAGNOSIS — M170 Bilateral primary osteoarthritis of knee: Secondary | ICD-10-CM | POA: Diagnosis not present

## 2018-09-16 DIAGNOSIS — Z780 Asymptomatic menopausal state: Secondary | ICD-10-CM | POA: Diagnosis not present

## 2018-09-16 DIAGNOSIS — Z01419 Encounter for gynecological examination (general) (routine) without abnormal findings: Secondary | ICD-10-CM | POA: Diagnosis not present

## 2018-09-16 DIAGNOSIS — N898 Other specified noninflammatory disorders of vagina: Secondary | ICD-10-CM | POA: Diagnosis not present

## 2018-10-06 DIAGNOSIS — Z780 Asymptomatic menopausal state: Secondary | ICD-10-CM | POA: Diagnosis not present

## 2018-10-09 DIAGNOSIS — Z96653 Presence of artificial knee joint, bilateral: Secondary | ICD-10-CM | POA: Diagnosis not present

## 2018-10-09 DIAGNOSIS — Z471 Aftercare following joint replacement surgery: Secondary | ICD-10-CM | POA: Diagnosis not present

## 2018-10-13 ENCOUNTER — Other Ambulatory Visit: Payer: Self-pay | Admitting: Internal Medicine

## 2018-10-13 DIAGNOSIS — Z1231 Encounter for screening mammogram for malignant neoplasm of breast: Secondary | ICD-10-CM

## 2018-10-13 DIAGNOSIS — R69 Illness, unspecified: Secondary | ICD-10-CM | POA: Diagnosis not present

## 2018-10-16 DIAGNOSIS — R69 Illness, unspecified: Secondary | ICD-10-CM | POA: Diagnosis not present

## 2018-12-16 ENCOUNTER — Other Ambulatory Visit: Payer: Self-pay

## 2018-12-16 ENCOUNTER — Ambulatory Visit
Admission: RE | Admit: 2018-12-16 | Discharge: 2018-12-16 | Disposition: A | Discharge status: Home / Self Care | Payer: Medicare HMO | Source: Ambulatory Visit | Attending: Internal Medicine | Admitting: Internal Medicine

## 2018-12-16 DIAGNOSIS — Z1231 Encounter for screening mammogram for malignant neoplasm of breast: Secondary | ICD-10-CM | POA: Diagnosis not present

## 2018-12-18 DIAGNOSIS — L570 Actinic keratosis: Secondary | ICD-10-CM | POA: Diagnosis not present

## 2018-12-18 DIAGNOSIS — L820 Inflamed seborrheic keratosis: Secondary | ICD-10-CM | POA: Diagnosis not present

## 2018-12-18 DIAGNOSIS — L720 Epidermal cyst: Secondary | ICD-10-CM | POA: Diagnosis not present

## 2018-12-18 DIAGNOSIS — L718 Other rosacea: Secondary | ICD-10-CM | POA: Diagnosis not present

## 2018-12-18 DIAGNOSIS — L80 Vitiligo: Secondary | ICD-10-CM | POA: Diagnosis not present

## 2018-12-18 DIAGNOSIS — D1801 Hemangioma of skin and subcutaneous tissue: Secondary | ICD-10-CM | POA: Diagnosis not present

## 2018-12-18 DIAGNOSIS — L814 Other melanin hyperpigmentation: Secondary | ICD-10-CM | POA: Diagnosis not present

## 2018-12-18 DIAGNOSIS — L821 Other seborrheic keratosis: Secondary | ICD-10-CM | POA: Diagnosis not present

## 2018-12-18 DIAGNOSIS — B078 Other viral warts: Secondary | ICD-10-CM | POA: Diagnosis not present

## 2019-02-02 DIAGNOSIS — R05 Cough: Secondary | ICD-10-CM | POA: Diagnosis not present

## 2019-02-02 DIAGNOSIS — Z7689 Persons encountering health services in other specified circumstances: Secondary | ICD-10-CM | POA: Diagnosis not present

## 2019-02-02 DIAGNOSIS — I10 Essential (primary) hypertension: Secondary | ICD-10-CM | POA: Diagnosis not present

## 2019-02-02 DIAGNOSIS — J069 Acute upper respiratory infection, unspecified: Secondary | ICD-10-CM | POA: Diagnosis not present

## 2019-02-02 DIAGNOSIS — J40 Bronchitis, not specified as acute or chronic: Secondary | ICD-10-CM | POA: Diagnosis not present

## 2019-02-02 DIAGNOSIS — U071 COVID-19: Secondary | ICD-10-CM | POA: Diagnosis not present

## 2019-02-02 DIAGNOSIS — Z20822 Contact with and (suspected) exposure to covid-19: Secondary | ICD-10-CM | POA: Diagnosis not present

## 2019-02-18 DIAGNOSIS — R69 Illness, unspecified: Secondary | ICD-10-CM | POA: Diagnosis not present

## 2019-02-25 DIAGNOSIS — R69 Illness, unspecified: Secondary | ICD-10-CM | POA: Diagnosis not present

## 2019-03-04 IMAGING — MG DIGITAL SCREENING BILATERAL MAMMOGRAM WITH TOMO AND CAD
8 series · 9 of 24 positions shown · non-contrast
Comparison: Previous exam(s).

CLINICAL DATA: Screening.

EXAM:
DIGITAL SCREENING BILATERAL MAMMOGRAM WITH TOMO AND CAD

[L MLO synth-2D]
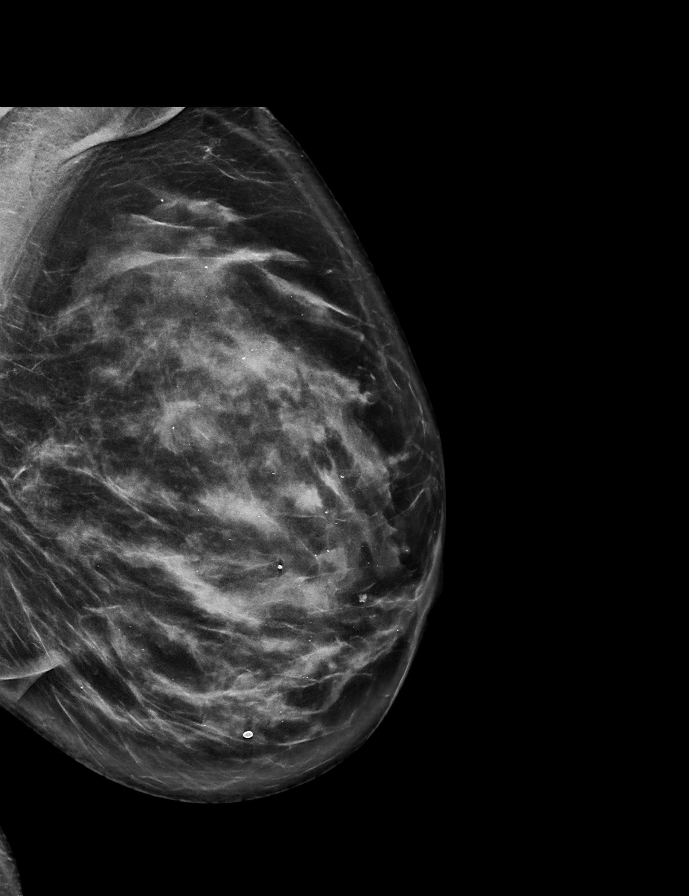

[L CC synth-2D]
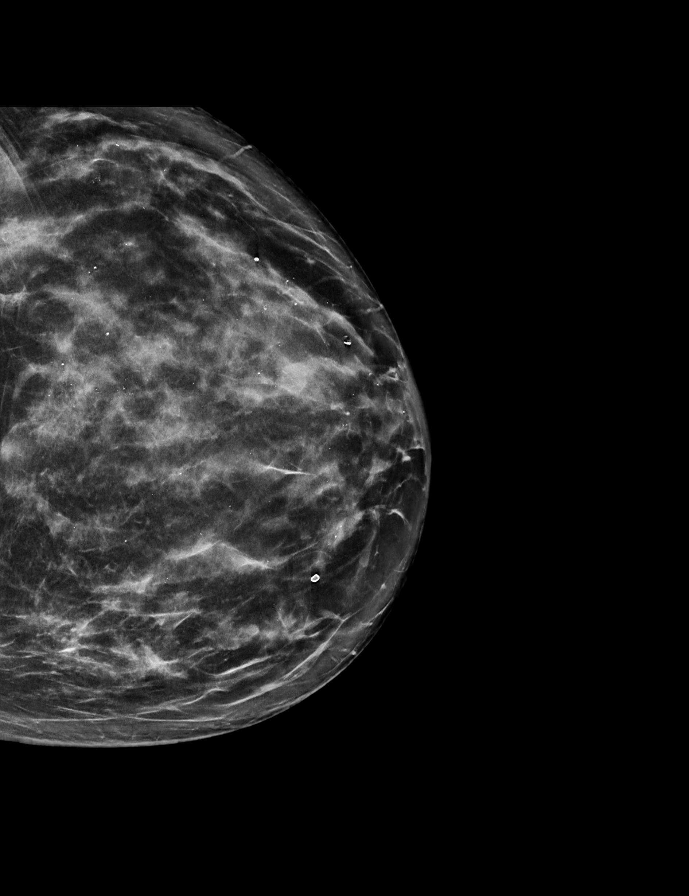

[R CC synth-2D]
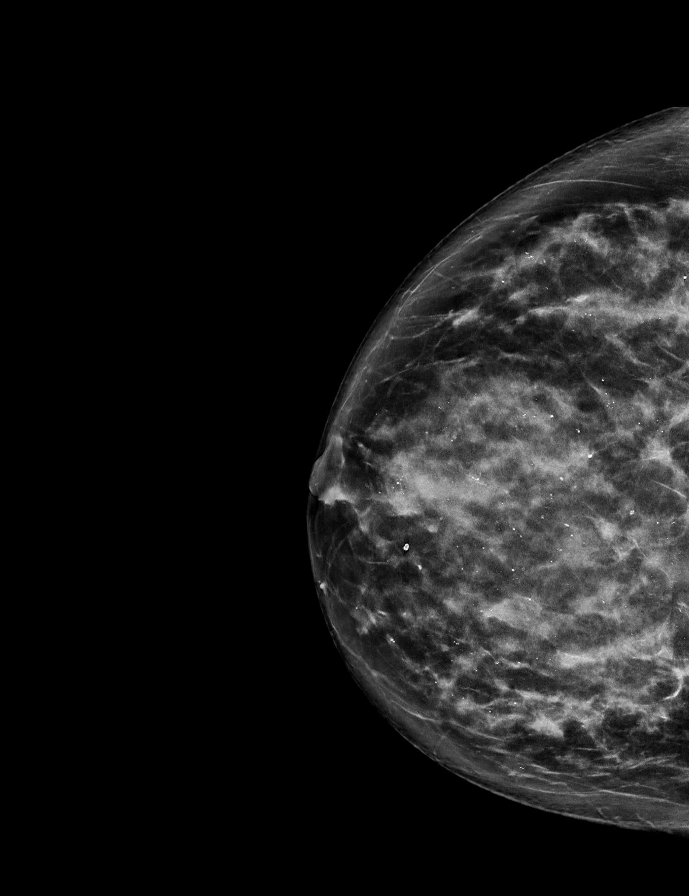

[R MLO synth-2D]
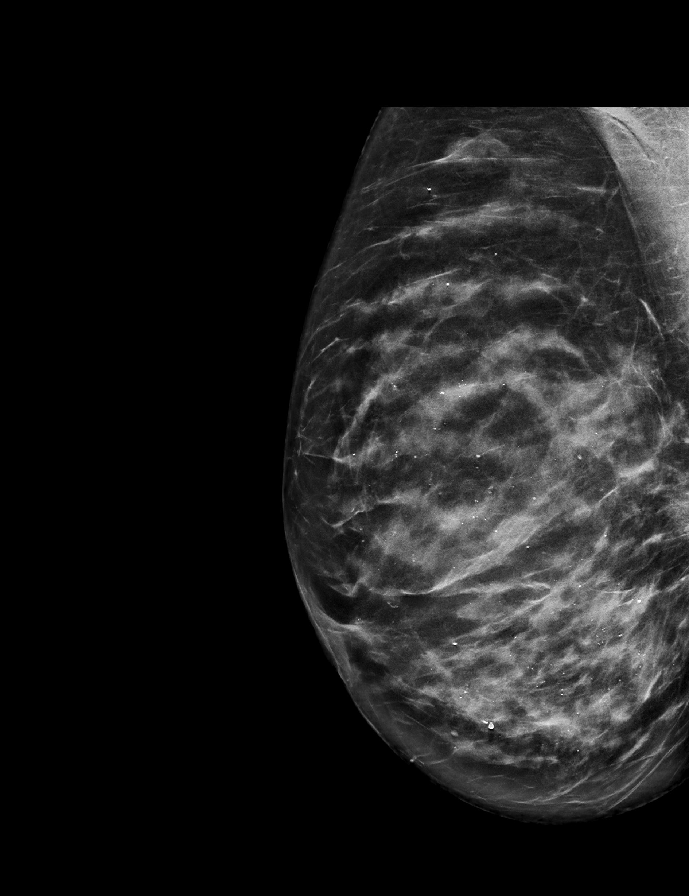

[L CC tomo · 2 of 73 frames shown]
[frame 24/73]
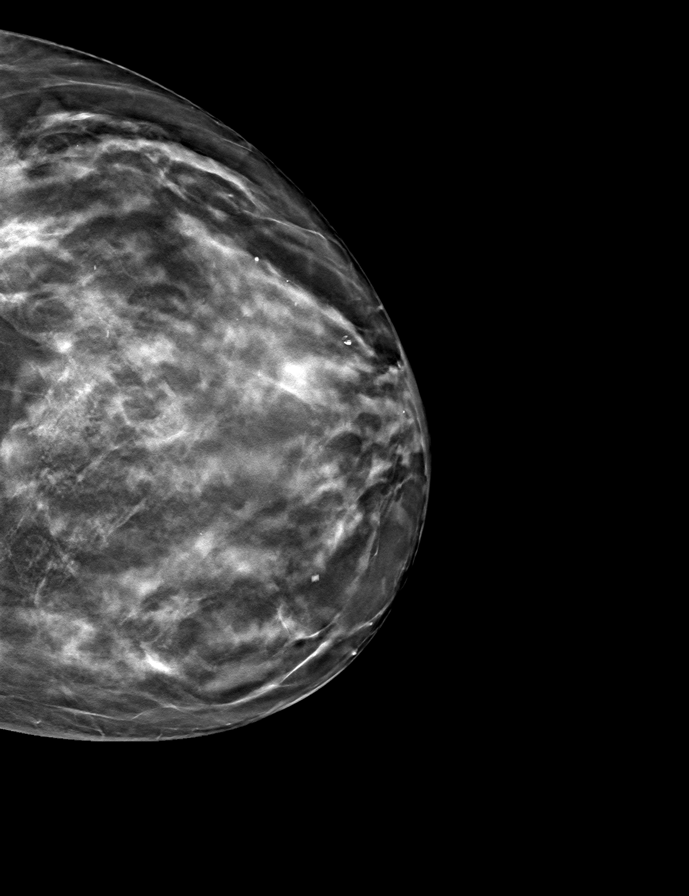
[frame 37/73]
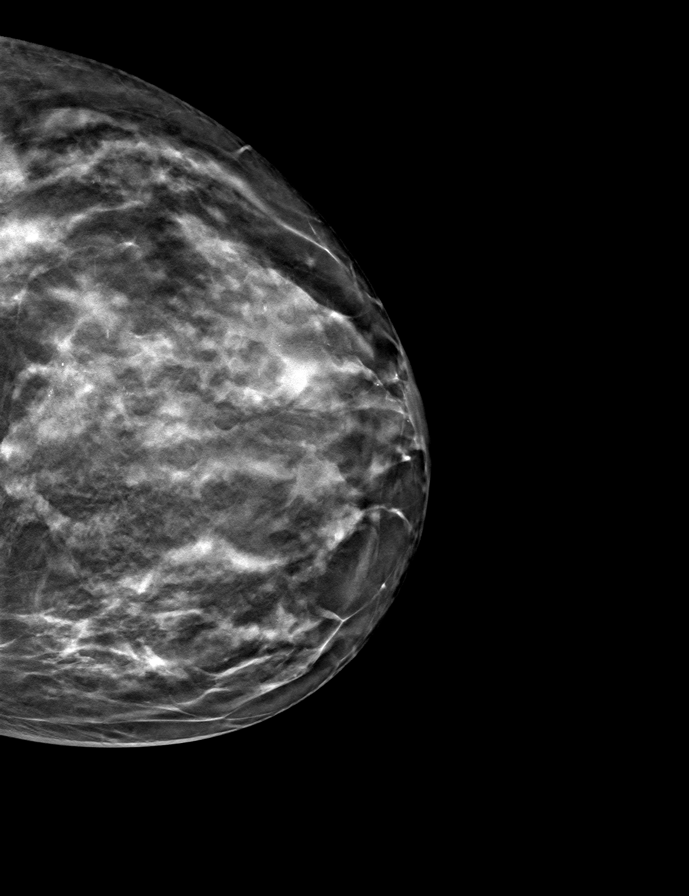

[R CC tomo · tomo slice 38/75.0]
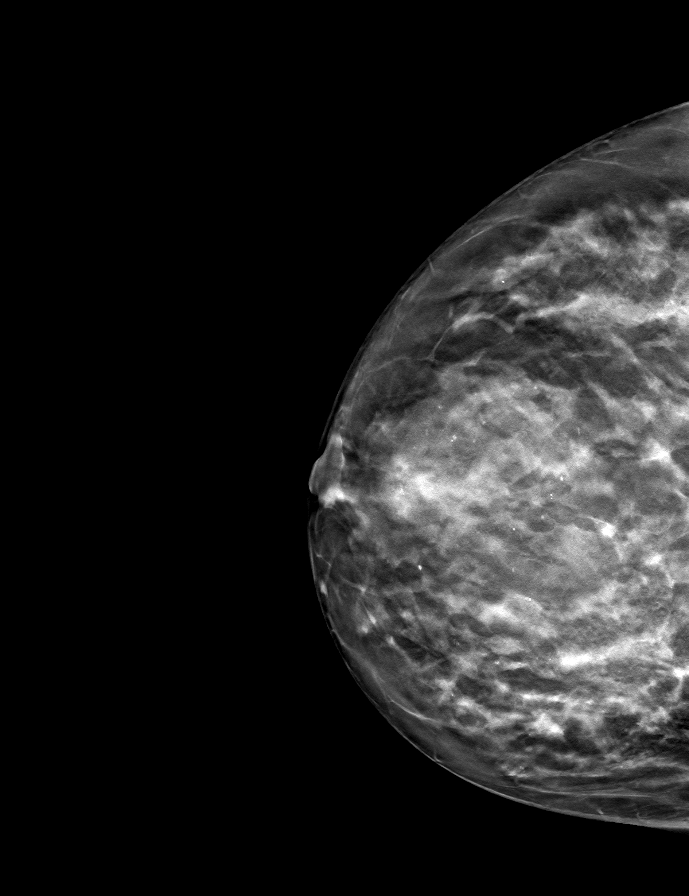

[R MLO tomo · tomo slice 39/76.0]
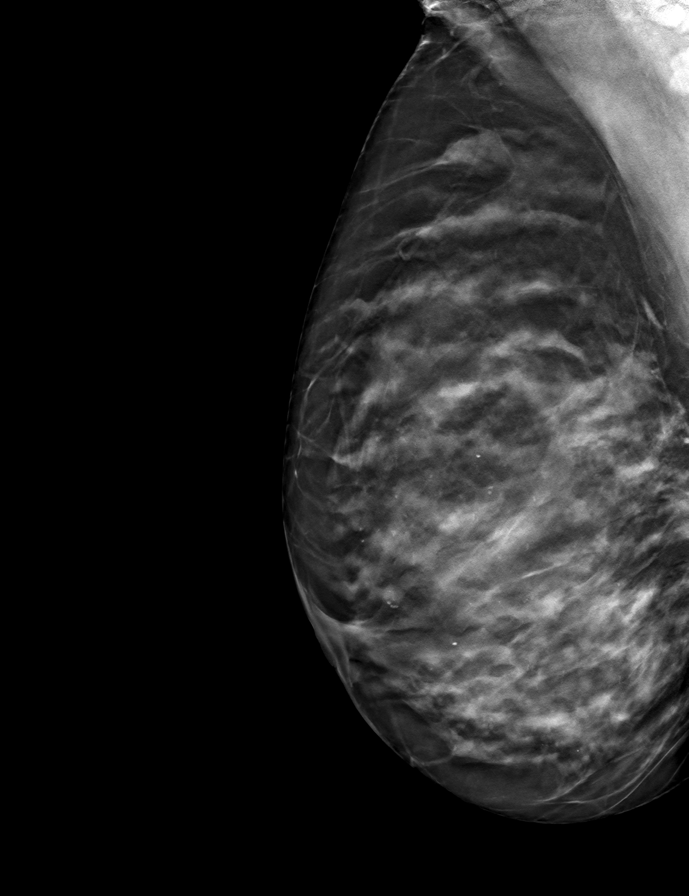

[L MLO tomo · tomo slice 40/79.0]
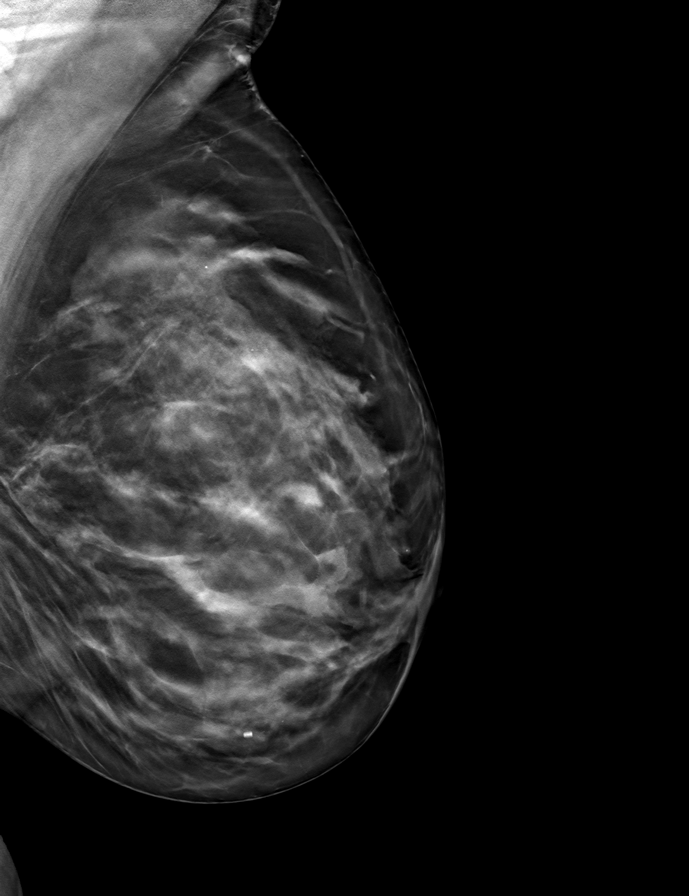

[9 of 24 positions shown; findings below may reference images not displayed]

ACR Breast Density Category c: The breast tissue is heterogeneously
dense, which may obscure small masses.
FINDINGS: In the left breast, possible distortion warrants further evaluation.
In the right breast, no findings suspicious for malignancy. Images
were processed with CAD.
IMPRESSION: Further evaluation is suggested for possible distortion in the left
breast.

RECOMMENDATION:
Diagnostic mammogram and possibly ultrasound of the left breast.
(Code:J9-G-HHO)

The patient will be contacted regarding the findings, and additional
imaging will be scheduled.

BI-RADS CATEGORY  0: Incomplete. Need additional imaging evaluation
and/or prior mammograms for comparison.

## 2019-06-09 DIAGNOSIS — H2513 Age-related nuclear cataract, bilateral: Secondary | ICD-10-CM | POA: Diagnosis not present

## 2019-07-01 ENCOUNTER — Other Ambulatory Visit: Payer: Self-pay

## 2019-07-01 ENCOUNTER — Inpatient Hospital Stay (HOSPITAL_COMMUNITY)
Admission: EM | Admit: 2019-07-01 | Discharge: 2019-07-03 | DRG: 918 | Disposition: A | Discharge status: Home / Self Care | Payer: Medicare HMO | Attending: Internal Medicine | Admitting: Internal Medicine

## 2019-07-01 ENCOUNTER — Encounter (HOSPITAL_COMMUNITY): Payer: Self-pay | Admitting: Emergency Medicine

## 2019-07-01 DIAGNOSIS — T63091A Toxic effect of venom of other snake, accidental (unintentional), initial encounter: Secondary | ICD-10-CM | POA: Diagnosis not present

## 2019-07-01 DIAGNOSIS — Z885 Allergy status to narcotic agent status: Secondary | ICD-10-CM | POA: Diagnosis not present

## 2019-07-01 DIAGNOSIS — S91032A Puncture wound without foreign body, left ankle, initial encounter: Secondary | ICD-10-CM | POA: Diagnosis not present

## 2019-07-01 DIAGNOSIS — E785 Hyperlipidemia, unspecified: Secondary | ICD-10-CM | POA: Diagnosis present

## 2019-07-01 DIAGNOSIS — I10 Essential (primary) hypertension: Secondary | ICD-10-CM | POA: Diagnosis not present

## 2019-07-01 DIAGNOSIS — T63001A Toxic effect of unspecified snake venom, accidental (unintentional), initial encounter: Secondary | ICD-10-CM

## 2019-07-01 DIAGNOSIS — Z888 Allergy status to other drugs, medicaments and biological substances status: Secondary | ICD-10-CM

## 2019-07-01 DIAGNOSIS — Z03818 Encounter for observation for suspected exposure to other biological agents ruled out: Secondary | ICD-10-CM | POA: Diagnosis not present

## 2019-07-01 DIAGNOSIS — Z79899 Other long term (current) drug therapy: Secondary | ICD-10-CM | POA: Diagnosis not present

## 2019-07-01 DIAGNOSIS — Z8249 Family history of ischemic heart disease and other diseases of the circulatory system: Secondary | ICD-10-CM | POA: Diagnosis not present

## 2019-07-01 DIAGNOSIS — F32A Depression, unspecified: Secondary | ICD-10-CM | POA: Diagnosis present

## 2019-07-01 DIAGNOSIS — Z20822 Contact with and (suspected) exposure to covid-19: Secondary | ICD-10-CM | POA: Diagnosis present

## 2019-07-01 DIAGNOSIS — R69 Illness, unspecified: Secondary | ICD-10-CM | POA: Diagnosis not present

## 2019-07-01 DIAGNOSIS — W5911XA Bitten by nonvenomous snake, initial encounter: Secondary | ICD-10-CM

## 2019-07-01 DIAGNOSIS — Z7982 Long term (current) use of aspirin: Secondary | ICD-10-CM

## 2019-07-01 DIAGNOSIS — F329 Major depressive disorder, single episode, unspecified: Secondary | ICD-10-CM

## 2019-07-01 DIAGNOSIS — Y92008 Other place in unspecified non-institutional (private) residence as the place of occurrence of the external cause: Secondary | ICD-10-CM | POA: Diagnosis not present

## 2019-07-01 DIAGNOSIS — L03116 Cellulitis of left lower limb: Secondary | ICD-10-CM | POA: Diagnosis not present

## 2019-07-01 DIAGNOSIS — Z96653 Presence of artificial knee joint, bilateral: Secondary | ICD-10-CM | POA: Diagnosis present

## 2019-07-01 DIAGNOSIS — Z23 Encounter for immunization: Secondary | ICD-10-CM

## 2019-07-01 LAB — CBC WITH DIFFERENTIAL/PLATELET
Abs Immature Granulocytes: 0.01 10*3/uL (ref 0.00–0.07)
Abs Immature Granulocytes: 0.01 10*3/uL (ref 0.00–0.07)
Basophils Absolute: 0 10*3/uL (ref 0.0–0.1)
Basophils Absolute: 0 10*3/uL (ref 0.0–0.1)
Basophils Relative: 1 %
Basophils Relative: 0 %
Eosinophils Absolute: 0.1 10*3/uL (ref 0.0–0.5)
Eosinophils Absolute: 0 10*3/uL (ref 0.0–0.5)
Eosinophils Relative: 2 %
Eosinophils Relative: 1 %
HCT: 44.1 % (ref 36.0–46.0)
HCT: 42 % (ref 36.0–46.0)
Hemoglobin: 16 g/dL — ABNORMAL HIGH (ref 12.0–15.0)
Hemoglobin: 14.6 g/dL (ref 12.0–15.0)
Immature Granulocytes: 0 %
Immature Granulocytes: 0 %
Lymphocytes Relative: 26 %
Lymphocytes Relative: 25 %
Lymphs Abs: 1.1 10*3/uL (ref 0.7–4.0)
Lymphs Abs: 1.4 10*3/uL (ref 0.7–4.0)
MCH: 32.5 pg (ref 26.0–34.0)
MCH: 31.9 pg (ref 26.0–34.0)
MCHC: 36.3 g/dL — ABNORMAL HIGH (ref 30.0–36.0)
MCHC: 34.8 g/dL (ref 30.0–36.0)
MCV: 89.6 fL (ref 80.0–100.0)
MCV: 91.9 fL (ref 80.0–100.0)
Monocytes Absolute: 0.4 10*3/uL (ref 0.1–1.0)
Monocytes Absolute: 0.4 10*3/uL (ref 0.1–1.0)
Monocytes Relative: 9 %
Monocytes Relative: 8 %
Neutro Abs: 2.8 10*3/uL (ref 1.7–7.7)
Neutro Abs: 3.6 10*3/uL (ref 1.7–7.7)
Neutrophils Relative %: 62 %
Neutrophils Relative %: 66 %
Platelets: 206 10*3/uL (ref 150–400)
Platelets: 203 10*3/uL (ref 150–400)
RBC: 4.92 MIL/uL (ref 3.87–5.11)
RBC: 4.57 MIL/uL (ref 3.87–5.11)
RDW: 13.3 % (ref 11.5–15.5)
RDW: 13.3 % (ref 11.5–15.5)
WBC: 4.4 10*3/uL (ref 4.0–10.5)
WBC: 5.4 10*3/uL (ref 4.0–10.5)
nRBC: 0 % (ref 0.0–0.2)
nRBC: 0 % (ref 0.0–0.2)

## 2019-07-01 LAB — PROTIME-INR
INR: 1 (ref 0.8–1.2)
INR: 1 (ref 0.8–1.2)
Prothrombin Time: 12.5 seconds (ref 11.4–15.2)
Prothrombin Time: 13.1 seconds (ref 11.4–15.2)

## 2019-07-01 LAB — BASIC METABOLIC PANEL
Anion gap: 9 (ref 5–15)
BUN: 17 mg/dL (ref 8–23)
CO2: 28 mmol/L (ref 22–32)
Calcium: 8.9 mg/dL (ref 8.9–10.3)
Chloride: 102 mmol/L (ref 98–111)
Creatinine, Ser: 0.63 mg/dL (ref 0.44–1.00)
GFR calc Af Amer: >60 mL/min (ref >60)
GFR calc non Af Amer: >60 mL/min (ref >60)
Glucose, Bld: 115 mg/dL — ABNORMAL HIGH (ref 70–99)
Potassium: 3.8 mmol/L (ref 3.5–5.1)
Sodium: 139 mmol/L (ref 135–145)

## 2019-07-01 LAB — SARS CORONAVIRUS 2 BY RT PCR (HOSPITAL ORDER, PERFORMED IN ~~LOC~~ HOSPITAL LAB): SARS Coronavirus 2: NEGATIVE

## 2019-07-01 LAB — FIBRINOGEN: Fibrinogen: 353 mg/dL (ref 210–475)

## 2019-07-01 LAB — HIV ANTIBODY (ROUTINE TESTING W REFLEX): HIV Screen 4th Generation wRfx: NONREACTIVE

## 2019-07-01 LAB — APTT: aPTT: 31 seconds (ref 24–36)

## 2019-07-01 MED ORDER — TRIAMTERENE-HCTZ 37.5-25 MG PO TABS
0.5000 | ORAL_TABLET | Freq: Every day | ORAL | Status: DC
  Administered 2019-07-01 – 2019-07-03 (×3): 0.5 via ORAL
  Filled 2019-07-01 (×3): qty 0.5

## 2019-07-01 MED ORDER — OMEGA-3-ACID ETHYL ESTERS 1 G PO CAPS
1.0000 g | ORAL_CAPSULE | Freq: Every day | ORAL | Status: DC
  Administered 2019-07-02 – 2019-07-03 (×2): 1 g via ORAL
  Filled 2019-07-01 (×3): qty 1

## 2019-07-01 MED ORDER — ATORVASTATIN CALCIUM 40 MG PO TABS
40.0000 mg | ORAL_TABLET | Freq: Every day | ORAL | Status: DC
  Administered 2019-07-01 – 2019-07-03 (×3): 40 mg via ORAL
  Filled 2019-07-01 (×3): qty 1

## 2019-07-01 MED ORDER — SODIUM CHLORIDE 0.9 % IV SOLN
4.0000 | Freq: Once | INTRAVENOUS | Status: AC
  Administered 2019-07-01: 4 via INTRAVENOUS
  Filled 2019-07-01: qty 72

## 2019-07-01 MED ORDER — GLUCOSAMINE HCL 1000 MG PO TABS: 1000.0000 mg | ORAL_TABLET | Freq: Every day | ORAL | Status: DC

## 2019-07-01 MED ORDER — AMLODIPINE BESYLATE 5 MG PO TABS
2.5000 mg | ORAL_TABLET | Freq: Every day | ORAL | Status: DC
  Administered 2019-07-01 – 2019-07-03 (×3): 2.5 mg via ORAL
  Filled 2019-07-01 (×3): qty 1

## 2019-07-01 MED ORDER — HYDROCODONE-ACETAMINOPHEN 5-325 MG PO TABS: 1.0000 | ORAL_TABLET | Freq: Four times a day (QID) | ORAL | Status: DC | PRN

## 2019-07-01 MED ORDER — SODIUM CHLORIDE 0.9 % IV SOLN: INTRAVENOUS | Status: DC

## 2019-07-01 MED ORDER — AZELAIC ACID 15 % EX GEL: Freq: Two times a day (BID) | CUTANEOUS | Status: DC

## 2019-07-01 MED ORDER — CROTALIDAE POLYVAL IMMUNE FAB IV SOLR: 4.0000 | Freq: Once | INTRAVENOUS | Status: DC

## 2019-07-01 MED ORDER — SODIUM CHLORIDE 0.9 % IV BOLUS
1000.0000 mL | Freq: Once | INTRAVENOUS | Status: AC
  Administered 2019-07-01: 1000 mL via INTRAVENOUS

## 2019-07-01 MED ORDER — ASPIRIN 81 MG PO TABS: 81.0000 mg | ORAL_TABLET | Freq: Every day | ORAL | Status: DC

## 2019-07-01 MED ORDER — BUPROPION HCL ER (XL) 150 MG PO TB24
150.0000 mg | ORAL_TABLET | Freq: Every day | ORAL | Status: DC
  Administered 2019-07-01 – 2019-07-03 (×3): 150 mg via ORAL
  Filled 2019-07-01 (×3): qty 1

## 2019-07-01 MED ORDER — ESTRADIOL 0.0375 MG/24HR TD PTTW: 1.0000 | MEDICATED_PATCH | TRANSDERMAL | Status: DC

## 2019-07-01 MED ORDER — ONE-DAILY MULTI VITAMINS PO TABS: 1.0000 | ORAL_TABLET | Freq: Every day | ORAL | Status: DC

## 2019-07-01 MED ORDER — ASPIRIN EC 81 MG PO TBEC
81.0000 mg | DELAYED_RELEASE_TABLET | Freq: Every day | ORAL | Status: DC
  Administered 2019-07-02 – 2019-07-03 (×2): 81 mg via ORAL
  Filled 2019-07-01 (×2): qty 1

## 2019-07-01 MED ORDER — OMEGA 3 1200 MG PO CAPS: 1200.0000 mg | ORAL_CAPSULE | Freq: Every day | ORAL | Status: DC

## 2019-07-01 MED ORDER — KETOROLAC TROMETHAMINE 30 MG/ML IJ SOLN: 30.0000 mg | Freq: Once | INTRAMUSCULAR | Status: DC

## 2019-07-01 MED ORDER — ONDANSETRON HCL 4 MG/2ML IJ SOLN: 4.0000 mg | Freq: Four times a day (QID) | INTRAMUSCULAR | Status: DC | PRN

## 2019-07-01 MED ORDER — ADULT MULTIVITAMIN W/MINERALS CH
1.0000 | ORAL_TABLET | Freq: Every day | ORAL | Status: DC
  Administered 2019-07-02 – 2019-07-03 (×2): 1 via ORAL
  Filled 2019-07-01 (×3): qty 1

## 2019-07-01 MED ORDER — ONDANSETRON HCL 4 MG PO TABS: 4.0000 mg | ORAL_TABLET | Freq: Four times a day (QID) | ORAL | Status: DC | PRN

## 2019-07-01 MED ORDER — VITAMIN D 25 MCG (1000 UNIT) PO TABS
5000.0000 [IU] | ORAL_TABLET | Freq: Every day | ORAL | Status: DC
  Administered 2019-07-02 – 2019-07-03 (×2): 5000 [IU] via ORAL
  Filled 2019-07-01 (×3): qty 5

## 2019-07-01 NOTE — ED Notes (Signed)
Measurements taken at 1135: Left foot measuring 9.25 in Left ankle measuring 9.25 in Left calf measuring 13.5 in Left thigh measuring 20 in  Right foot measuring 9.25 in Right ankle measuring 9 in Right calf measuring 13.5 in Right thigh measuring 20 in

## 2019-07-01 NOTE — Progress Notes (Signed)
No new swelling noted and patient states no additional pain at site. Measurements made 07/01/19 2100  Left foot measuring10 in Left ankle measuring10.5 in Left calf measuring 14 in Left thigh measuring 20 in    Right foot measuring 9 in Right ankle measuring 8.5 in Right calf measuring  14 in Right thigh measuring 20 in

## 2019-07-01 NOTE — ED Notes (Addendum)
This RN was informed by Anderson Malta, RN on 3W that pt will not be able to go to med/surg (room 1326), pt will have to go to telemetry.

## 2019-07-01 NOTE — ED Provider Notes (Signed)
[facility] DEPT Provider Note   CSN: [id number] Arrival date & time: 07/01/19  7829     History No chief complaint on file.   Danielle Hayes is a 69 y.o. female.  The history is provided by the patient. No language interpreter was used.     69 year old female with history of anxiety, dysrhythmia, hypertension, presenting for evaluation of a recent snakebite.  Patient report approximately an hour and a half ago she was bitten by a snake to her left ankle.  She brought the dead snake with her. She was at her porch watering a plant when this happened. She felt mild sharp pain to her L inner ankle then notice the snake.  She is currently report mild non radiating pain to affected area. Denies fever, lightheadedness, dizziness, chest pain, shortness of breath, nausea, diaphoresis, numbness. Should she is up-to-date with immunization. She denies any specific treatment tried.  Past Medical History:  Diagnosis Date  . Anxiety   . Arthritis   . Bronchitis   . C. difficile diarrhea 10/2014   states was related to antibiotic-? cefdinir  . Complication of anesthesia    has big time phobia from nausea vomting last time got sick was at 69 years old   . Dysrhythmia    PVC's  . Ear infection 10/2015  . Hyperlipidemia   . Hypertension   . PONV (postoperative nausea and vomiting)     Patient Active Problem List   Diagnosis Date Noted  . Arthrofibrosis of total knee arthroplasty ([place]) 08/09/2014  . OA (osteoarthritis) of knee 06/28/2014    Past Surgical History:  Procedure Laterality Date  . KNEE ARTHROSCOPY     right and left  . KNEE ARTHROSCOPY     right, left  . KNEE CLOSED REDUCTION Left 08/09/2014   Procedure: LEFT KNEE CLOSED MANIPULATION ;  Surgeon: Gaynelle Arabian, MD;  Location: [facility];  Service: Orthopedics;  Laterality: Left;  . LEEP    . TONSILLECTOMY    . TOTAL KNEE ARTHROPLASTY Left 06/28/2014   Procedure: LEFT TOTAL KNEE ARTHROPLASTY;   Surgeon: Gaynelle Arabian, MD;  Location: [facility];  Service: Orthopedics;  Laterality: Left;  . TOTAL KNEE ARTHROPLASTY Right 10/17/2015   Procedure: RIGHT TOTAL KNEE ARTHROPLASTY;  Surgeon: Gaynelle Arabian, MD;  Location: [facility];  Service: Orthopedics;  Laterality: Right;     OB History   No obstetric history on file.     Family History  Problem Relation Age of Onset  . Heart disease Mother   . Colon cancer Neg Hx   . Pancreatic cancer Neg Hx   . Rectal cancer Neg Hx   . Stomach cancer Neg Hx     Social History   Tobacco Use  . Smoking status: Never Smoker  . Smokeless tobacco: Never Used  Substance Use Topics  . Alcohol use: Yes    Alcohol/week: 6.0 standard drinks    Types: 6 Glasses of wine per week    Comment: weekly  . Drug use: No    Home Medications Prior to Admission medications   Medication Sig Start Date End Date Taking? Authorizing Provider  amLODipine (NORVASC) 2.5 MG tablet Take 2.5 mg by mouth every morning.     [provider]  aspirin 81 MG tablet Take 81 mg by mouth daily.    [provider]  atorvastatin (LIPITOR) 40 MG tablet Take 40 mg by mouth every morning.     [provider]  buPROPion (WELLBUTRIN XL)  150 MG 24 hr tablet Take 150 mg by mouth daily.    [provider]  estradiol (VIVELLE-DOT) 0.075 MG/24HR APP 1 PATCH TO SKIN TWICE A WEEK 12/21/16   [provider]  glycopyrrolate (ROBINUL) 2 MG tablet Take 1 tablet (2 mg total) by mouth 2 (two) times daily as needed (abdominal cramping). 02/19/18   Esterwood, Amy S, PA-C  progesterone (PROMETRIUM) 200 MG capsule Prometrium 200 mg capsule  1 capsule at hs 1-14 as directed    [provider]  psyllium (METAMUCIL) 58.6 % powder Take 1 packet by mouth 3 (three) times daily.    [provider]  triamterene-hydrochlorothiazide (MAXZIDE-25) 37.5-25 MG per tablet Take 0.5 tablets by mouth every morning.     [provider]    Allergies      Cefdinir, Chlorhexidine, Hydrocodone, Oxycodone, Synvisc [hylan g-f 20], and Xarelto [rivaroxaban]  Review of Systems   Review of Systems  All other systems reviewed and are negative.   Physical Exam Updated Vital Signs BP (!) 154/104 (BP Location: Right Arm)   Pulse (!) 111   Temp 98.1 F (36.7 C) (Oral)   Resp 20   Ht 5\' 2"  (1.575 m)   Wt 64 kg   SpO2 99%   BMI 25.81 kg/m   Physical Exam Vitals and nursing note reviewed.  Constitutional:      General: She is not in acute distress.    Appearance: She is well-developed.  HENT:     Head: Atraumatic.  Eyes:     Conjunctiva/sclera: Conjunctivae normal.  Cardiovascular:     Rate and Rhythm: Tachycardia present.     Pulses: Normal pulses.     Heart sounds: Normal heart sounds.  Pulmonary:     Effort: Pulmonary effort is normal.     Breath sounds: Normal breath sounds.  Abdominal:     Palpations: Abdomen is soft.  Musculoskeletal:        General: Signs of injury (Left ankle: 2 small puncture wound approximately 1 cm apart noted to the medial malleoli region with mild surrounding skin erythema and edema measuring approximately 4 cm in diameter.  Area is tender to palpation.  No lymphangitis.  Dorsalis pulse palpable) present.     Cervical back: Neck supple.     Comments: Left leg compartment is soft.  Skin:    Findings: No rash.  Neurological:     Mental Status: She is alert and oriented to person, place, and time.  Psychiatric:        Mood and Affect: Mood normal.     ED Results / Procedures / Treatments   Labs (all labs ordered are listed, but only abnormal results are displayed) Labs Reviewed  BASIC METABOLIC PANEL - Abnormal; Notable for the following components:      Result Value   Glucose, Bld 115 (*)    All other components within normal limits  CBC WITH DIFFERENTIAL/PLATELET - Abnormal; Notable for the following components:   Hemoglobin 16.0 (*)    MCHC 36.3 (*)    All other components within normal  limits  SARS CORONAVIRUS 2 BY RT PCR (HOSPITAL ORDER, [facility])  FIBRINOGEN  PROTIME-INR  HIV ANTIBODY (ROUTINE TESTING W REFLEX)  PROTIME-INR  APTT  CBC WITH DIFFERENTIAL/PLATELET    EKG None  Radiology No results found.  Procedures .Critical Care Performed by: Domenic Moras, PA-C Authorized by: Domenic Moras, PA-C   Critical care provider statement:    Critical care time (minutes):  40   Critical care was time spent personally by me on the following activities:  Discussions with consultants, evaluation of patient's response to treatment, examination of patient, ordering and performing treatments and interventions, ordering and review of laboratory studies, ordering and review of radiographic studies, pulse oximetry, re-evaluation of patient's condition, obtaining history from patient or surrogate and review of old charts   (including critical care time)  Medications Ordered in ED Medications  ketorolac (TORADOL) 30 MG/ML injection 30 mg (30 mg Intravenous Refused 07/01/19 1507)  crotalidae polyvalent immune fab (CROFAB) 4 vial in sodium chloride 0.9 % 250 mL infusion (4 vials Intravenous New Bag/Given 07/01/19 1427)  aspirin tablet 81 mg (has no administration in time range)  amLODipine (NORVASC) tablet 2.5 mg (has no administration in time range)  atorvastatin (LIPITOR) tablet 40 mg (has no administration in time range)  triamterene-hydrochlorothiazide (MAXZIDE-25) 37.5-25 MG per tablet 0.5 tablet (has no administration in time range)  buPROPion (WELLBUTRIN XL) 24 hr tablet 150 mg (has no administration in time range)  estradiol (VIVELLE-DOT) 0.0375 MG/24HR 1 patch (has no administration in time range)  Glucosamine HCl TABS 1,000 mg (has no administration in time range)  cholecalciferol (VITAMIN D3) tablet 5,000 Units (has no administration in time range)  multivitamin tablet 1 tablet (has no administration in time range)  Omega 3 CAPS 1,200 mg (has  no administration in time range)  Azelaic Acid 15 % (has no administration in time range)  0.9 %  sodium chloride infusion (has no administration in time range)  ondansetron (ZOFRAN) tablet 4 mg (has no administration in time range)    Or  ondansetron (ZOFRAN) injection 4 mg (has no administration in time range)  HYDROcodone-acetaminophen (NORCO/VICODIN) 5-325 MG per tablet 1 tablet (has no administration in time range)  sodium chloride 0.9 % bolus 1,000 mL (0 mLs Intravenous Stopped 07/01/19 1230)    ED Course  I have reviewed the triage vital signs and the nursing notes.  Pertinent labs & imaging results that were available during my care of the patient were reviewed by me and considered in my medical decision making (see chart for details).    MDM Rules/Calculators/A&P                          BP (!) 155/85   Pulse 88   Temp 98.1 F (36.7 C) (Oral)   Resp 18   Ht 5\' 2"  (1.575 m)   Wt 64 kg   SpO2 98%   BMI 25.81 kg/m   Final Clinical Impression(s) / ED Diagnoses Final diagnoses:  Venomous snake bite, accidental or unintentional, initial encounter    Rx / DC Orders ED Discharge Orders    None     10:15 AM Patient was bitten the left ankle by a copperhead this morning.  The copperhead is approximately 14 inches in length.  Patient does not exhibit any systemic symptoms aside from mild tachycardia from feeling anxious.  I have reached out and contact [employer].  Recommendation is as below.  Serial measurement foot/ankle/calf/thigh hourly x 8 from time of bite Elevate ankle 6" above heart PT/INR, fibrinogen, CBC @ 6hr Watch for progression of swelling.  If past ankle, then recommend 4-6 vial of crofab then reevaluate and overnight obs.  Opioid for pain control and supportive care. Eda Keys, RN.   12:59 PM Progressive worsening swelling and pain to affected area.  It has spread 4cm above initial margin in the past  2 hrs.  I have reached out to [facility]  and was recommended to give 4 vial of CroFab and to monitor.  Will also give pain medication.   Appreciate consultation from [facility] hospitalist who agrees to admit patient for further management of snakebite.  COVID-19 screening test ordered.  Patient is currently receiving CroFab and is in monitor closely.  Pain medication given.  She is resting comfortably.   Domenic Moras, PA-C 07/01/19 1522    Fredia Sorrow, MD 07/02/19 [phone]

## 2019-07-01 NOTE — ED Triage Notes (Signed)
C/C snake bite to inner L ankle, around 0820. One puncture wound noted. Patient brought dead snake with her, appears to be a copperhead. Minor swelling around site, no bruising at this time. Circulation intact.

## 2019-07-01 NOTE — ED Notes (Signed)
Left foot measuring 10 in Left ankle measuring 10 in Left calf measuring 13.5 in Left thigh measuring 20 in  Right foot measuring 9.25 in Right ankle measuring 9 in Right calf measuring 13.5 in Right thigh measuring 20 in

## 2019-07-01 NOTE — ED Notes (Signed)
Left foot elevated 6 in above heart as directed by Domenic Moras, PA.  Left foot measuring 9.25 in Left ankle measuring 9.25 in Left calf measuring 13.5 in Left thigh measuring 20 in  Right foot measuring 9.25 in Right ankle measuring 9 in Right calf measuring 13.5 in Right thigh measuring 20 in

## 2019-07-01 NOTE — Progress Notes (Signed)
Spoke with Poison Control [phone]). Continue to monitor at this point. She does feel that more antivenum is needed at this time. Cont to monitor site. If pt begins to have trouble moving the ankle. Call and let Poison control know. Cont with current plan of care. WIll update oncoming RN

## 2019-07-01 NOTE — H&P (Signed)
History and Physical    Danielle Hayes HEN:[id number] DOB: 03-May-1950 DOA: 07/01/2019  PCP: Prince Solian, MD   Patient coming from: Home  I have personally briefly reviewed patient's old medical records in [place]  Chief Complaint: Left ankle pain  HPI: Danielle Hayes is a 69 y.o. female with medical history significant for hypertension, depression and dyslipidemia who presents to the emergency room after she sustained a snake bite to her left ankle.  Patient states she was watering her plants when she felt a sting to her left ankle and looked down only to see a copperhead snake.  She called her husband who came out and killed the snake. The bite occurred 8.20am.  Patient noted to have 2 puncture wounds on the left ankle with minor swelling and bruising. She complains of pain in her left ankle but denies having any fever, no chills, no nausea, no vomiting, no dizziness, no lightheadedness, no abdominal pain or any changes in her bowel habits She received a dose of CroFab in the ER due to progression of the swelling and will be referred to observation status for serial measurements of her extremities.  ED Course: Patient is a 69 year old female who presented to the ER after a snakebite (from a copper head snake).  She received a dose of CroFab in the ER and will be referred to observation status for further evaluation.  Review of Systems: As per HPI otherwise 10 point review of systems negative.    Past Medical History:  Diagnosis Date  . Anxiety   . Arthritis   . Bronchitis   . C. difficile diarrhea 10/2014   states was related to antibiotic-? cefdinir  . Complication of anesthesia    has big time phobia from nausea vomting last time got sick was at 69 years old   . Dysrhythmia    PVC's  . Ear infection 10/2015  . Hyperlipidemia   . Hypertension   . PONV (postoperative nausea and vomiting)     Past Surgical History:  Procedure Laterality Date  . KNEE  ARTHROSCOPY     right and left  . KNEE ARTHROSCOPY     right, left  . KNEE CLOSED REDUCTION Left 08/09/2014   Procedure: LEFT KNEE CLOSED MANIPULATION ;  Surgeon: Gaynelle Arabian, MD;  Location: [facility];  Service: Orthopedics;  Laterality: Left;  . LEEP    . TONSILLECTOMY    . TOTAL KNEE ARTHROPLASTY Left 06/28/2014   Procedure: LEFT TOTAL KNEE ARTHROPLASTY;  Surgeon: Gaynelle Arabian, MD;  Location: [facility];  Service: Orthopedics;  Laterality: Left;  . TOTAL KNEE ARTHROPLASTY Right 10/17/2015   Procedure: RIGHT TOTAL KNEE ARTHROPLASTY;  Surgeon: Gaynelle Arabian, MD;  Location: [facility];  Service: Orthopedics;  Laterality: Right;     reports that she has never smoked. She has never used smokeless tobacco. She reports current alcohol use of about 6.0 standard drinks of alcohol per week. She reports that she does not use drugs.  Allergies  Allergen Reactions  . Cefdinir Other (See Comments)    c diff  . Chlorhexidine Itching    Itchy and rash  . Hydrocodone Nausea Only  . Oxycodone Other (See Comments)    hallucinations  . Synvisc [Hylan G-F 20] Swelling    Red, could not walk , rooster injections into knee  . Xarelto [Rivaroxaban] Hives    Family History  Problem Relation Age of Onset  . Heart disease Mother   . Colon cancer Neg Hx   .  Pancreatic cancer Neg Hx   . Rectal cancer Neg Hx   . Stomach cancer Neg Hx      Prior to Admission medications   Medication Sig Start Date End Date Taking? Authorizing Provider  amLODipine (NORVASC) 2.5 MG tablet Take 2.5 mg by mouth every morning.    Yes [provider]  aspirin 81 MG tablet Take 81 mg by mouth daily.   Yes [provider]  atorvastatin (LIPITOR) 40 MG tablet Take 40 mg by mouth every morning.    Yes [provider]  Azelaic Acid (FINACEA EX) Apply 1 application topically in the morning and at bedtime.   Yes [provider]  buPROPion (WELLBUTRIN XL) 150 MG 24 hr tablet Take 150 mg by mouth daily.    Yes [provider]  cholecalciferol (VITAMIN D3) 25 MCG (1000 UNIT) tablet Take 5,000 Units by mouth daily.   Yes [provider]  estradiol (VIVELLE-DOT) 0.0375 MG/24HR Place 1 patch onto the skin 2 (two) times a week. 03/24/19  Yes [provider]  Glucosamine HCl 1000 MG TABS Take 1,000 mg by mouth in the morning and at bedtime.   Yes [provider]  Multiple Vitamin (MULTIVITAMIN) tablet Take 1 tablet by mouth daily.   Yes [provider]  Omega 3 1200 MG CAPS Take 1,200 mg by mouth daily.   Yes [provider]  triamterene-hydrochlorothiazide (MAXZIDE-25) 37.5-25 MG per tablet Take 0.5 tablets by mouth every morning.    Yes [provider]  glycopyrrolate (ROBINUL) 2 MG tablet Take 1 tablet (2 mg total) by mouth 2 (two) times daily as needed (abdominal cramping). Patient not taking: Reported on 07/01/2019 02/19/18   Alfredia Ferguson, PA-C    Physical Exam: Vitals:   07/01/19 1200 07/01/19 1234 07/01/19 1330 07/01/19 1430  BP: 110/64 (!) 155/85 132/68 128/73  Pulse: 87 88 87 80  Resp: 16 18 15 19   Temp:      TempSrc:      SpO2: 95% 98% 97% 98%  Weight:      Height:         Vitals:   07/01/19 1200 07/01/19 1234 07/01/19 1330 07/01/19 1430  BP: 110/64 (!) 155/85 132/68 128/73  Pulse: 87 88 87 80  Resp: 16 18 15 19   Temp:      TempSrc:      SpO2: 95% 98% 97% 98%  Weight:      Height:        Constitutional: NAD, alert and oriented x 3.  Appears anxious Eyes: PERRL, lids and conjunctivae normal ENMT: Mucous membranes are moist.  Neck: normal, supple, no masses, no thyromegaly Respiratory: clear to auscultation bilaterally, no wheezing, no crackles. Normal respiratory effort. No accessory muscle use.  Cardiovascular: Regular rate and rhythm, no murmurs / rubs / gallops. No extremity edema. 2+ pedal pulses. No carotid bruits.  Abdomen: no tenderness, no masses palpated. No hepatosplenomegaly. Bowel sounds positive.    Musculoskeletal: no clubbing / cyanosis.  2 puncture wounds over the medial aspect of left ankle with minimal swelling and bruising Skin: no rashes, lesions, ecchymosis over the left ankle Neurologic: No gross focal neurologic deficit. Psychiatric: Normal mood and affect.   Labs on Admission: I have personally reviewed following labs and imaging studies  CBC: Recent Labs  Lab 07/01/19 1040  WBC 4.4  NEUTROABS 2.8  HGB 16.0*  HCT 44.1  MCV 89.6  PLT 244   Basic Metabolic Panel: Recent Labs  Lab 07/01/19 1040  NA 139  K 3.8  CL 102  CO2 28  GLUCOSE 115*  BUN 17  CREATININE 0.63  CALCIUM 8.9   GFR: Estimated Creatinine Clearance: 59.2 mL/min (by C-G formula based on SCr of 0.63 mg/dL). Liver Function Tests: No results for input(s): AST, ALT, ALKPHOS, BILITOT, PROT, ALBUMIN in the last 168 hours. No results for input(s): LIPASE, AMYLASE in the last 168 hours. No results for input(s): AMMONIA in the last 168 hours. Coagulation Profile: Recent Labs  Lab 07/01/19 1040  INR 1.0   Cardiac Enzymes: No results for input(s): CKTOTAL, CKMB, CKMBINDEX, TROPONINI in the last 168 hours. BNP (last 3 results) No results for input(s): PROBNP in the last 8760 hours. HbA1C: No results for input(s): HGBA1C in the last 72 hours. CBG: No results for input(s): GLUCAP in the last 168 hours. Lipid Profile: No results for input(s): CHOL, HDL, LDLCALC, TRIG, CHOLHDL, LDLDIRECT in the last 72 hours. Thyroid Function Tests: No results for input(s): TSH, T4TOTAL, FREET4, T3FREE, THYROIDAB in the last 72 hours. Anemia Panel: No results for input(s): VITAMINB12, FOLATE, FERRITIN, TIBC, IRON, RETICCTPCT in the last 72 hours. Urine analysis:    Component Value Date/Time   COLORURINE YELLOW 10/07/2015 1055   APPEARANCEUR CLEAR 10/07/2015 1055   LABSPEC 1.003 (L) 10/07/2015 1055   PHURINE 7.0 10/07/2015 1055   GLUCOSEU NEGATIVE 10/07/2015 1055   HGBUR NEGATIVE 10/07/2015 1055    BILIRUBINUR NEGATIVE 10/07/2015 1055   KETONESUR NEGATIVE 10/07/2015 1055   PROTEINUR NEGATIVE 10/07/2015 1055   UROBILINOGEN 0.2 06/22/2014 1016   NITRITE NEGATIVE 10/07/2015 1055   LEUKOCYTESUR NEGATIVE 10/07/2015 1055    Radiological Exams on Admission: No results found.  EKG: Independently reviewed.   Assessment/Plan Principal Problem:   Snake bite Active Problems:   Essential hypertension   Depression    Snakebite Patient is status post a copperhead snakebite and on admission is noted to have 2 puncture wounds on the medial aspect of the left ankle with minimal ecchymosis and swelling Patient received a dose of CroFab in the emergency room and will be referred to observation status for close monitoring Serial measurements of patient's lower extremities every 1-2 hours for the next 6 hours to document no further progression of the swelling If patient has further progression of the swelling will give another dose of CroFab We will check PT/INR, platelets and fibrinogen 6 hours post snake bite to rule out coagulopathy Keep left lower extremity elevated Pain control   Hypertension Continue amlodipine and triamterene/HCTZ   Depression Continue bupropion   DVT prophylaxis: None  Code Status: Full code Family Communication: Greater than 50% of time was spent discussing plan of care with patient at the bedside.  All questions and concerns have been addressed.  She verbalizes understanding and agrees with the plan. Disposition Plan: Back to previous home environment Consults called: None    Ziyonna Christner MD [facility]     07/01/2019, 2:49 PM

## 2019-07-01 NOTE — Progress Notes (Signed)
PHARMACIST - PHYSICIAN ORDER COMMUNICATION  CONCERNING: P&T Medication Policy on Herbal Medications  DESCRIPTION:  This patient's order for:  Glucosamine  has been noted.  This product(s) is classified as an "herbal" or natural product. Due to a lack of definitive safety studies or FDA approval, nonstandard manufacturing practices, plus the potential risk of unknown drug-drug interactions while on inpatient medications, the Pharmacy and Therapeutics Committee does not permit the use of "herbal" or natural products of this type within [facility].   ACTION TAKEN: The pharmacy department is unable to verify this order at this time and your patient has been informed of this safety policy. Please reevaluate patient's clinical condition at discharge and address if the herbal or natural product(s) should be resumed at that time.   Dia Sitter, PharmD, BCPS 07/01/2019 5:37 PM

## 2019-07-01 NOTE — ED Provider Notes (Addendum)
Medical screening examination/treatment/procedure(s) were conducted as a shared visit with non-physician practitioner(s) and myself.  I personally evaluated the patient during the encounter.      CRITICAL CARE Performed by: Fredia Sorrow Total critical care time: 60 minutes Critical care time was exclusive of separately billable procedures and treating other patients. Critical care was necessary to treat or prevent imminent or life-threatening deterioration. Critical care was time spent personally by me on the following activities: development of treatment plan with patient and/or surrogate as well as nursing, discussions with consultants, evaluation of patient's response to treatment, examination of patient, obtaining history from patient or surrogate, ordering and performing treatments and interventions, ordering and review of laboratory studies, ordering and review of radiographic studies, pulse oximetry and re-evaluation of patient's condition.   Patient seen by me along with physician assistant.  Patient sustained a snake bite to her and her left ankle.  They were able to kill the snake and bring it in it is a copperhead.  The bite occurred around 830.  There are 2 puncture wounds.  There is a some swelling to the medial aspect of the ankle with some redness.  Nothing above the ankle joint line at this time.  Dorsalis pedis pulses 2+.  Good cap refill.  No tenderness or swelling to the leg or calf.  Poison control was contacted.  Were taking measurements getting baseline labs and will continue to observe for 8 hours as per their recommendation.  Patient at this time nontoxic no acute distress.  Pulse was 82 blood pressure 126/99 oxygen saturations 100%.  Evidence of emanation going above the ankle joint.  4 hours from the time of the bite.  Recontacted poison control.  They are recommending starting the CroFab.  We will initiate that.  And get patient admitted.  Patient's vital signs are  still very stable.   Fredia Sorrow, MD 07/01/19 1042    Fredia Sorrow, MD 07/01/19 1313

## 2019-07-01 NOTE — ED Notes (Signed)
Dr. Rogene Houston and Domenic Moras, PA bedside assessing increased swelling to left ankle. Will continue to monitor. Pt c/o slight discomfort "but not very painful right now."

## 2019-07-01 NOTE — ED Notes (Signed)
Increased swelling is noted to left ankle. Dr. Rogene Houston bedside and aware.  Left foot measuring 9.25 in Left ankle measuring 9.5 in Left calf measuring 13.5 in Left thigh measuring 20 in  Right foot measuring 9.25 in Right ankle measuring 9 in Right calf measuring 13.5 in Right thigh measuring 20 in

## 2019-07-01 NOTE — ED Notes (Signed)
ED Provider bedside 

## 2019-07-01 NOTE — Progress Notes (Addendum)
Spoke with poison([id number]) Control updated on pt conditions. Did share that there was a slight increase in swelling at the ankle. Marked with time and date   Left foot measuring 10 in Left ankle measuring 10.5 in Left calf measuring 13.5 in Left thigh measuring 20 in  Right foot measuring 9.25 in Right ankle measuring 9 in Right calf measuring 13.5 in Right thigh measuring 20 in

## 2019-07-01 NOTE — Progress Notes (Signed)
Left foot measuring 10 in Left ankle measuring 10 in Left calf measuring 13.5 in Left thigh measuring 20 in  Right foot measuring 9.25 in Right ankle measuring 9 in Right calf measuring 13.5 in Right thigh measuring 20 in

## 2019-07-01 NOTE — Progress Notes (Signed)
No new swelling noted and patient states no additional pain at site. Measurements made 07/01/19 at 2200  Left foot measuring10 in Left ankle measuring10 in Left calf measuring 14 in Left thigh measuring 20 in    Right foot measuring 9 in Right ankle measuring 8.5 in Right calf measuring  14 in Right thigh measuring 20 in

## 2019-07-01 NOTE — Progress Notes (Signed)
[facility]  Asked to evaluate for ICU admission Snake bite Vital signs are stable Resting comfortably on my visit Does not need ICU admission Call if questions  Roselie Awkward, MD [facility] Pager: [phone] Cell: [phone] If no response, call [phone]

## 2019-07-02 DIAGNOSIS — Z7982 Long term (current) use of aspirin: Secondary | ICD-10-CM | POA: Diagnosis not present

## 2019-07-02 DIAGNOSIS — W5911XA Bitten by nonvenomous snake, initial encounter: Secondary | ICD-10-CM | POA: Diagnosis not present

## 2019-07-02 DIAGNOSIS — F329 Major depressive disorder, single episode, unspecified: Secondary | ICD-10-CM | POA: Diagnosis present

## 2019-07-02 DIAGNOSIS — R69 Illness, unspecified: Secondary | ICD-10-CM | POA: Diagnosis not present

## 2019-07-02 DIAGNOSIS — I10 Essential (primary) hypertension: Secondary | ICD-10-CM | POA: Diagnosis not present

## 2019-07-02 DIAGNOSIS — Z8249 Family history of ischemic heart disease and other diseases of the circulatory system: Secondary | ICD-10-CM | POA: Diagnosis not present

## 2019-07-02 DIAGNOSIS — Z20822 Contact with and (suspected) exposure to covid-19: Secondary | ICD-10-CM | POA: Diagnosis not present

## 2019-07-02 DIAGNOSIS — E785 Hyperlipidemia, unspecified: Secondary | ICD-10-CM | POA: Diagnosis present

## 2019-07-02 DIAGNOSIS — Z888 Allergy status to other drugs, medicaments and biological substances status: Secondary | ICD-10-CM | POA: Diagnosis not present

## 2019-07-02 DIAGNOSIS — Y92008 Other place in unspecified non-institutional (private) residence as the place of occurrence of the external cause: Secondary | ICD-10-CM | POA: Diagnosis not present

## 2019-07-02 DIAGNOSIS — Z23 Encounter for immunization: Secondary | ICD-10-CM | POA: Diagnosis not present

## 2019-07-02 DIAGNOSIS — T63091A Toxic effect of venom of other snake, accidental (unintentional), initial encounter: Secondary | ICD-10-CM | POA: Diagnosis not present

## 2019-07-02 DIAGNOSIS — Z79899 Other long term (current) drug therapy: Secondary | ICD-10-CM | POA: Diagnosis not present

## 2019-07-02 DIAGNOSIS — Z885 Allergy status to narcotic agent status: Secondary | ICD-10-CM | POA: Diagnosis not present

## 2019-07-02 DIAGNOSIS — Z96653 Presence of artificial knee joint, bilateral: Secondary | ICD-10-CM | POA: Diagnosis present

## 2019-07-02 DIAGNOSIS — L03116 Cellulitis of left lower limb: Secondary | ICD-10-CM | POA: Diagnosis not present

## 2019-07-02 DIAGNOSIS — S91032A Puncture wound without foreign body, left ankle, initial encounter: Secondary | ICD-10-CM | POA: Diagnosis not present

## 2019-07-02 LAB — BASIC METABOLIC PANEL
Anion gap: 9 (ref 5–15)
BUN: 15 mg/dL (ref 8–23)
CO2: 26 mmol/L (ref 22–32)
Calcium: 8.2 mg/dL — ABNORMAL LOW (ref 8.9–10.3)
Chloride: 104 mmol/L (ref 98–111)
Creatinine, Ser: 0.5 mg/dL (ref 0.44–1.00)
GFR calc Af Amer: >60 mL/min (ref >60)
GFR calc non Af Amer: >60 mL/min (ref >60)
Glucose, Bld: 99 mg/dL (ref 70–99)
Potassium: 3.5 mmol/L (ref 3.5–5.1)
Sodium: 139 mmol/L (ref 135–145)

## 2019-07-02 LAB — CBC
HCT: 42.7 % (ref 36.0–46.0)
Hemoglobin: 15 g/dL (ref 12.0–15.0)
MCH: 32.8 pg (ref 26.0–34.0)
MCHC: 35.1 g/dL (ref 30.0–36.0)
MCV: 93.2 fL (ref 80.0–100.0)
Platelets: 173 10*3/uL (ref 150–400)
RBC: 4.58 MIL/uL (ref 3.87–5.11)
RDW: 13.8 % (ref 11.5–15.5)
WBC: 7.2 10*3/uL (ref 4.0–10.5)
nRBC: 0 % (ref 0.0–0.2)

## 2019-07-02 LAB — PROTIME-INR
INR: 1 (ref 0.8–1.2)
Prothrombin Time: 12.6 seconds (ref 11.4–15.2)

## 2019-07-02 MED ORDER — TRAZODONE HCL 50 MG PO TABS
50.0000 mg | ORAL_TABLET | Freq: Once | ORAL | Status: AC
  Administered 2019-07-02: 50 mg via ORAL
  Filled 2019-07-02: qty 1

## 2019-07-02 MED ORDER — CEFAZOLIN SODIUM-DEXTROSE 1-4 GM/50ML-% IV SOLN
1.0000 g | Freq: Three times a day (TID) | INTRAVENOUS | Status: DC
  Administered 2019-07-02 – 2019-07-03 (×3): 1 g via INTRAVENOUS
  Filled 2019-07-02 (×4): qty 50

## 2019-07-02 NOTE — Progress Notes (Signed)
No new swelling noted and patient states no additional pain at site. Measurements made 07/02/2019 0200  Left foot measuring9.5 in Left ankle measuring9 in Left calf measuring14 in Left thigh measuring20 in   Right foot measuring9 in Right ankle measuring8.5 in Right calf measuring14 in Right thigh measuring20 in

## 2019-07-02 NOTE — Progress Notes (Signed)
No new swelling noted and patient denies any pain at site.   Left foot measurement: 9 in. Left ankle measurement: 8.5 in. Left calf measurement:14in. Left thigh measurement: 20 in.  Right foot measurement: 9 in. Right ankle measurement: 8.0 in. Right calf measurement: 14 in. Right thigh measurement: 20 in

## 2019-07-02 NOTE — Progress Notes (Signed)
PROGRESS NOTE    Danielle Hayes  QTM:[id number] DOB: 10/05/1950 DOA: 07/01/2019 PCP: Prince Solian, MD   Chef Complaints: Snakebite  Brief Narrative: 69 year old female with history of hypertension, depression, dyslipidemia brought to the ED after having a snake bite on the left ankle by copperhead snake.  Patient received CroFab in the ER due to progression of the swelling and was admitted.  Subjective: Patient reports she had some pain on walking has erythema and swelling in the left ankle but is receding. Denies any nausea vomiting fever chills.  Assessment & Plan:  Left ankle snake bite with swelling erythema and cellulitis.  Status post antivenom CroFab x1 In ED.Swelling receding. We will keep on empiric Ancef iv, and monitor overnight.  PT/INR is stable yesterday repeating PT/INR today.  Continue to monitor local area serially.  Essential hypertension: BP controlled on amlodipine triamterene hydrochlorothiazide  Depression - mood is stable.  DVT prophylaxis: Holding yesterday due to snakebite on left ankle, holding chemical prophylaxis due to snakebite/risk of coagulopathy. Code Status: Code Family Communication: plan of care discussed with patient at bedside.  Status is: Admitted under observation Patient remains hospitalized for ongoing monitoring due to snakebite and associated left ankle cellulitis   Dispo: The patient is from: Home              Anticipated d/c is to: Home              Anticipated d/c date is: 1 day              Patient currently is not medically stable to d/c.  Nutrition: Diet Order            Diet 2 gram sodium Room service appropriate? Yes; Fluid consistency: Thin  Diet effective now                Body mass index is 25.81 kg/m.  Consultants:see note  Procedures:see note Microbiology:see note  Medications: Scheduled Meds: . amLODipine  2.5 mg Oral Daily  . aspirin EC  81 mg Oral Daily  . atorvastatin  40 mg Oral Daily  .  Azelaic Acid   Apply externally BID  . buPROPion  150 mg Oral Daily  . cholecalciferol  5,000 Units Oral Daily  . estradiol  1 patch Transdermal Once per day on Mon Thu  . ketorolac  30 mg Intravenous Once  . multivitamin with minerals  1 tablet Oral Daily  . omega-3 acid ethyl esters  1 g Oral Daily  . triamterene-hydrochlorothiazide  0.5 tablet Oral Daily   Continuous Infusions: . sodium chloride 100 mL/hr at 07/01/19 2103  .  ceFAZolin (ANCEF) IV      Antimicrobials: Anti-infectives (From admission, onward)   Start     Dose/Rate Route Frequency Ordered Stop   07/02/19 1400  ceFAZolin (ANCEF) IVPB 1 g/50 mL premix     Discontinue     1 g 100 mL/hr over 30 Minutes Intravenous Every 8 hours 07/02/19 1152         Objective: Vitals: Today's Vitals   07/01/19 2100 07/02/19 0116 07/02/19 0453 07/02/19 0901  BP:  129/82 126/85   Pulse:  76 68   Resp:  14 16   Temp:  98.4 F (36.9 C) 98.1 F (36.7 C)   TempSrc:  Oral Oral   SpO2:  99% 97%   Weight:      Height:      PainSc: 3    0-No pain    Intake/Output Summary (  Last 24 hours) at 07/02/2019 1153 Last data filed at 07/01/2019 1813 Gross per 24 hour  Intake 160 ml  Output --  Net 160 ml   Filed Weights   07/01/19 0852  Weight: 64 kg   Weight change:    Intake/Output from previous day: 06/30 0701 - 07/01 0700 In: 160 [P.O.:160] Out: -  Intake/Output this shift: No intake/output data recorded.  Examination:  General exam: AAOx 3,NAD, weak appearing. HEENT:Oral mucosa moist, Ear/Nose WNL grossly,dentition normal. Respiratory system: bilaterally clear,no wheezing or crackles,no use of accessory muscle, non tender. Cardiovascular system: S1 & S2 +, regular, No JVD. Gastrointestinal system: Abdomen soft, NT,ND, BS+. Nervous System:Alert, awake, moving extremities and grossly nonfocal Extremities: Left ankle with area of snake bite mark along with tenderness swelling, receding erythema and swelling,distal  peripheral pulses palpable.  Skin: No rashes,no icterus. MSK: Normal muscle bulk,tone, power  Data Reviewed: I have personally reviewed following labs and imaging studies CBC: Recent Labs  Lab 07/01/19 1040 07/01/19 1732 07/02/19 0400  WBC 4.4 5.4 7.2  NEUTROABS 2.8 3.6  --   HGB 16.0* 14.6 15.0  HCT 44.1 42.0 42.7  MCV 89.6 91.9 93.2  PLT 206 203 283   Basic Metabolic Panel: Recent Labs  Lab 07/01/19 1040 07/02/19 0400  NA 139 139  K 3.8 3.5  CL 102 104  CO2 28 26  GLUCOSE 115* 99  BUN 17 15  CREATININE 0.63 0.50  CALCIUM 8.9 8.2*   GFR: Estimated Creatinine Clearance: 59.2 mL/min (by C-G formula based on SCr of 0.5 mg/dL). Liver Function Tests: No results for input(s): AST, ALT, ALKPHOS, BILITOT, PROT, ALBUMIN in the last 168 hours. No results for input(s): LIPASE, AMYLASE in the last 168 hours. No results for input(s): AMMONIA in the last 168 hours. Coagulation Profile: Recent Labs  Lab 07/01/19 1040 07/01/19 1732  INR 1.0 1.0   Cardiac Enzymes: No results for input(s): CKTOTAL, CKMB, CKMBINDEX, TROPONINI in the last 168 hours. BNP (last 3 results) No results for input(s): PROBNP in the last 8760 hours. HbA1C: No results for input(s): HGBA1C in the last 72 hours. CBG: No results for input(s): GLUCAP in the last 168 hours. Lipid Profile: No results for input(s): CHOL, HDL, LDLCALC, TRIG, CHOLHDL, LDLDIRECT in the last 72 hours. Thyroid Function Tests: No results for input(s): TSH, T4TOTAL, FREET4, T3FREE, THYROIDAB in the last 72 hours. Anemia Panel: No results for input(s): VITAMINB12, FOLATE, FERRITIN, TIBC, IRON, RETICCTPCT in the last 72 hours. Sepsis Labs: No results for input(s): PROCALCITON, LATICACIDVEN in the last 168 hours.  Recent Results (from the past 240 hour(s))  SARS Coronavirus 2 by RT PCR (hospital order, performed in [facility] hospital lab) Nasopharyngeal Nasopharyngeal Swab     Status: None   Collection Time: 07/01/19  2:06 PM    Specimen: Nasopharyngeal Swab  Result Value Ref Range Status   SARS Coronavirus 2 NEGATIVE NEGATIVE Final    Comment: (NOTE) SARS-CoV-2 target nucleic acids are NOT DETECTED.  The SARS-CoV-2 RNA is generally detectable in upper and lower respiratory specimens during the acute phase of infection. The lowest concentration of SARS-CoV-2 viral copies this assay can detect is 250 copies / mL. A negative result does not preclude SARS-CoV-2 infection and should not be used as the sole basis for treatment or other patient management decisions.  A negative result may occur with improper specimen collection / handling, submission of specimen other than nasopharyngeal swab, presence of viral mutation(s) within the areas targeted by this assay, and  inadequate number of viral copies (<250 copies / mL). A negative result must be combined with clinical observations, patient history, and epidemiological information.  Fact Sheet for Patients:   StrictlyIdeas.no  Fact Sheet for Healthcare Providers: BankingDealers.co.za  This test is not yet approved or  cleared by the [place] FDA and has been authorized for detection and/or diagnosis of SARS-CoV-2 by FDA under an Emergency Use Authorization (EUA).  This EUA will remain in effect (meaning this test can be used) for the duration of the COVID-19 declaration under Section 564(b)(1) of the Act, 21 U.S.C. section 360bbb-3(b)(1), unless the authorization is terminated or revoked sooner.  Performed at [facility], [place] [address], [place], [place] [address]       Radiology Studies: No results found.   LOS: 0 days   Antonieta Pert, MD [facility]  07/02/2019, 11:53 AM

## 2019-07-02 NOTE — Progress Notes (Signed)
No new swelling. Patient denies any pain.    Left foot measurement: 8.75 in. Left ankle measurement: 8.5 in. Left calf measurement: 13.75 in. Left thigh measurement: 20.0 in.  Right foot measurement: 8.5 in. Right ankle measurement: 8.0 in. Right calf measurement: 14.25 in. Right thigh measurement: 18.0"

## 2019-07-02 NOTE — Progress Notes (Signed)
No new swelling noted and patient states no additional pain at site. Measurements made 07/01/19 at 0000  Left foot measuring9.5 in Left ankle measuring9.5 in Left calf measuring14 in Left thigh measuring20 in   Right foot measuring9 in Right ankle measuring8.5 in Right calf measuring14 in Right thigh measuring20 in

## 2019-07-02 NOTE — Progress Notes (Addendum)
No new swelling. Patient denies any pain.    Left foot measurement: 8.5 in. Left ankle measurement: 8.5 in. Left calf measurement: 14.0 in. Left thigh measurement: 20 in.  Right foot measurement: 8.5 in. Right ankle measurement: 8.0 in. Right calf measurement: 14.25 in. Right thigh measurement: 18.0"

## 2019-07-02 NOTE — Discharge Instructions (Signed)
Snake Bite Snake bite is an injury to the skin or the deeper tissues beneath the skin that is caused by a snake. There are two types of snakes: poisonous (venomous) and nonpoisonous (nonvenomous). A nonvenomous snake will cause a wound. A venomous snake will cause a wound and may also inject poison (venom) into the wound. The effects of snake venom vary depending on the type of snake. In some cases, the effects can be extremely serious or even deadly. A bite from a venomous snake is a medical emergency. Treatment may require the use of antivenom medicine. What are the causes? This injury is caused by a venomous snake or a nonvenomous snake. What increases the risk? You are more likely to get a snake bite if:  You walk or hike in outdoor areas.  You do not cover your arms and legs with clothing when hiking.  You provoke or try to pick up a snake. What are the signs or symptoms? Symptoms of a snake bite vary depending on the type of snake, whether the snake is venomous, and the severity of the bite.  Symptoms for both a venomous or nonvenomous snake may include:  Pain, redness, and swelling at the site of the bite.  Skin discoloration at the site of the bite.  A feeling of nervousness. Symptoms of a venomous snake bite may also include:  Increasing pain and swelling.  Severe anxiety or confusion.  Blood blisters or purple spots in the bite area.  Nausea and vomiting.  Numbness or tingling.  Muscle weakness.  Excessive fatigue or drowsiness.  Excessive sweating.  Difficulty breathing.  Blurred vision.  Bruising and bleeding at the site of the bite.  Feeling faint or light-headed. In some cases, symptoms do not develop until a few hours after the bite. How is this diagnosed? This condition may be diagnosed based on symptoms and a physical exam. Your health care provider will examine the bite area and ask for details about the snake to help determine whether it is  venomous. You may also have tests, including blood tests. How is this treated? Treatment depends on the severity of the bite and whether the snake is venomous.  Treatment for nonvenomous snake bites may include basic wound care. This includes cleaning the wound and applying a bandage (dressing).  Treatment for venomous snake bites may include antivenom medicine in addition to wound care. This medicine needs to be given as soon as possible after the bite. Other treatments may be needed to help control symptoms as they develop. You may need to stay in a hospital so your condition can be monitored. Your health care provider may prescribe antibiotic medicine to avoid infection in the wound. You may need a tetanus shot if it has been more than 5 years since you had one. Follow these instructions at home: Wound care   Follow instructions from your health care provider about how to take care of your wound. Make sure you: ? Wash your hands with soap and water before you change your dressing. If soap and water are not available, use hand sanitizer. ? Change your dressing as told by your health care provider.  Keep the wound clean and dry. Wash the wound daily with soap and water or a germ-killing (antiseptic) soap as told by your health care provider.  Check your wound every day for signs of infection. Watch for: ? Redness, swelling, or pain that is getting worse. ? Warmth. ? Fluid, blood, or pus.  If you  develop blistering at the site of the bite, protect the blisters from breaking. Do not attempt to open a blister.  Do not take baths, swim, or use a hot tub until your health care provider approves. Ask your health care provider if you may take showers. You may only be allowed to take sponge baths. Medicines  Take or apply over-the-counter and prescription medicines only as told by your health care provider.  If you were prescribed an antibiotic medicine, take or apply it as told by your  health care provider. Do not stop using the antibiotic even if your condition improves. General instructions  If possible, keep the affected area raised (elevated) above the level of your heart while you are sitting or lying down.  Keep all follow-up visits as told by your health care provider. This is important. Contact a health care provider if you have:  Increased redness, swelling, or pain at the site of your wound.  Fluid, blood, or pus coming from your wound.  A fever. Get help right away if:  You develop blood blisters or purple spots in the bite area.  You have: ? Nausea or vomiting. ? Numbness or tingling. ? Excessive sweating. ? Trouble breathing. ? Trouble seeing.  You feel very confused.  You feel faint or light-headed. Summary  Snake bite is an injury to the skin or the deeper tissues beneath the skin that is caused by a snake.  A nonvenomous snake will cause a wound. A venomous snake will cause a wound and may also inject poison (venom) into the wound.  The effects of snake venom vary depending on the type of snake.  Treatment depends on the severity of the bite and whether the snake is venomous. You may require antivenom or antibiotic medicine after a snake bite. This information is not intended to replace advice given to you by your health care provider. Make sure you discuss any questions you have with your health care provider. Document Revised: 06/05/2017 Document Reviewed: 06/05/2017 Elsevier Patient Education  [place].

## 2019-07-02 NOTE — Progress Notes (Signed)
No new swelling noted and patient states no additional pain at site. Measurements made 07/02/2019 0600  Left foot measuring9.5in Left ankle measuring9in Left calf measuring14 in Left thigh measuring20 in   Right foot measuring9 in Right ankle measuring8.5 in Right calf measuring14 in Right thigh measuring20 in

## 2019-07-02 NOTE — Progress Notes (Signed)
No new swelling. Patient denies any pain.   Left foot measurement: 8.75 in. Left ankle measurement: 8.5 in. Left calf measurement: 13.75 in. Left thigh measurement: 19.5 in.  Right foot measurement: 8.5 in. Right ankle measurement: 8.0 in. Right calf measurement: 14.25 in. Right thigh measurement: 18.0"

## 2019-07-03 LAB — PROTIME-INR
INR: 1 (ref 0.8–1.2)
Prothrombin Time: 12.5 seconds (ref 11.4–15.2)

## 2019-07-03 NOTE — Progress Notes (Signed)
No new swelling noted and patient denies any pain at site.   Left foot measurement: 9 in. Left ankle measurement: 8.5 in. Left calf measurement:14in. Left thigh measurement:20 in.  Right foot measurement: 9 in. Right ankle measurement: 8.0 in. Right calf measurement: 14 in. Right thigh measurement: 20 in  Update given to poison control. Snake bite home care sheet faxed from poison control reviewed with patient.

## 2019-07-03 NOTE — Discharge Summary (Signed)
Physician Discharge Summary  [name] DUK:[id number] DOB: September 13, 1950 DOA: 07/01/2019  PCP: Prince Solian, MD  Admit date: 07/01/2019 Discharge date: 07/03/2019  Admitted From: home Disposition:  home  Recommendations for Outpatient Follow-up:  Follow up with PCP in 1-2 weeks Please obtain BMP/CBC in one week Please follow up on the following pending results:  Home Health:no  Equipment/Devices: none  Discharge Condition: Stable Code Status: stable Diet recommendation:  Diet Order             Diet - low sodium heart healthy           Diet 2 gram sodium Room service appropriate? Yes; Fluid consistency: Thin  Diet effective now                   Brief/Interim Summary: 69 year old female with history of hypertension, depression, dyslipidemia brought to the ED after having a snake bite on the left ankle by copperhead snake.  Patient received CroFab in the ER due to progression of the swelling and was admitted. Pt was monitored and swelling got better and being sent home after discussing with the poison control today  Discharge Diagnoses:  Principal Problem:   Snake bite Active Problems:   Essential hypertension   Depression  Left ankle snake bite with swelling erythema and cellulitis.  Status post antivenom CroFab x1 In ED. Swelling receded has mild swelling at bite site. Has not needed any pain meds and is able to ambulate. I called poison Maryann today and discussed and okay for discharge with routine instruction which has been provided to the patient.Treated with Ancef iv here, after discussing with poison control rare to get cellulitis and will hold off antibiotics (  Also she had side effect with C. difficile after using antibiotics from her joint surgery) She is aware about follow-up instruction Poison control follow-up with the patient next 1 or 2 days.  Advised to follow-up with PCP as well.   Essential hypertension:  Patient continue home meds.      Depression - mood is stable  Consults: Poison control  Subjective: Ambulating in the room mild pain and tenderness and swelling at the local wound bedside.  Discharge Exam: Vitals:   07/03/19 0445 07/03/19 0923  BP: (!) 136/96 (!) 129/106  Pulse: 71   Resp: 14   Temp: 98.2 F (36.8 C)   SpO2: 97%    General: Pt is alert, awake, not in acute distress Cardiovascular: RRR, S1/S2 +, no rubs, no gallops Respiratory: CTA bilaterally, no wheezing, no rhonchi Abdominal: Soft, NT, ND, bowel sounds + Extremities: no edema, no cyanosis Mild bluish discoloration and tenderness and swelling at the site of the bite  Discharge Instructions  Discharge Instructions     Diet - low sodium heart healthy   Complete by: As directed    Discharge instructions   Complete by: As directed    Please call call MD or return to ER for similar or worsening recurring problem that brought you to hospital or if any fever,nausea/vomiting,abdominal pain, uncontrolled pain, chest pain,  shortness of breath or any other alarming symptoms.  Please follow-up your doctor as instructed in a week time and call the office for appointment.  Please avoid alcohol, smoking, or any other illicit substance and maintain healthy habits including taking your regular medications as prescribed.  You were cared for by a hospitalist during your hospital stay. If you have any questions about your discharge medications or the care you received while you were  in the hospital after you are discharged, you can call the unit and ask to speak with the hospitalist on call if the hospitalist that took care of you is not available.  Once you are discharged, your primary care physician will handle any further medical issues. Please note that NO REFILLS for any discharge medications will be authorized once you are discharged, as it is imperative that you return to your primary care physician (or establish a relationship with a primary care  physician if you do not have one) for your aftercare needs so that they can reassess your need for medications and monitor your lab values  Please call at poison control number for any question and concern at [phone]. Please follow-up the post snakebite instruction you have been provided with.   Increase activity slowly   Complete by: As directed       Allergies as of 07/03/2019       Reactions   Cefdinir Other (See Comments)   c diff   Chlorhexidine Itching   Itchy and rash   Hydrocodone Nausea Only   Oxycodone Other (See Comments)   hallucinations   Synvisc [hylan G-f 20] Swelling   Red, could not walk , rooster injections into knee   Xarelto [rivaroxaban] Hives        Medication List     TAKE these medications    amLODipine 2.5 MG tablet Commonly known as: NORVASC Take 2.5 mg by mouth every morning.   aspirin 81 MG tablet Take 81 mg by mouth daily.   atorvastatin 40 MG tablet Commonly known as: LIPITOR Take 40 mg by mouth every morning.   buPROPion 150 MG 24 hr tablet Commonly known as: WELLBUTRIN XL Take 150 mg by mouth daily.   cholecalciferol 25 MCG (1000 UNIT) tablet Commonly known as: VITAMIN D3 Take 5,000 Units by mouth daily.   estradiol 0.0375 MG/24HR Commonly known as: VIVELLE-DOT Place 1 patch onto the skin 2 (two) times a week.   FINACEA EX Apply 1 application topically in the morning and at bedtime.   Glucosamine HCl 1000 MG Tabs Take 1,000 mg by mouth in the morning and at bedtime.   glycopyrrolate 2 MG tablet Commonly known as: ROBINUL Take 1 tablet (2 mg total) by mouth 2 (two) times daily as needed (abdominal cramping).   multivitamin tablet Take 1 tablet by mouth daily.   Omega 3 1200 MG Caps Take 1,200 mg by mouth daily.   triamterene-hydrochlorothiazide 37.5-25 MG tablet Commonly known as: MAXZIDE-25 Take 0.5 tablets by mouth every morning.         Allergies  Allergen Reactions   Cefdinir Other (See Comments)    c  diff   Chlorhexidine Itching    Itchy and rash   Hydrocodone Nausea Only   Oxycodone Other (See Comments)    hallucinations   Synvisc [Hylan G-F 20] Swelling    Red, could not walk , rooster injections into knee   Xarelto [Rivaroxaban] Hives    The results of significant diagnostics from this hospitalization (including imaging, microbiology, ancillary and laboratory) are listed below for reference.    Microbiology: Recent Results (from the past 240 hour(s))  SARS Coronavirus 2 by RT PCR (hospital order, performed in [facility] hospital lab) Nasopharyngeal Nasopharyngeal Swab     Status: None   Collection Time: 07/01/19  2:06 PM   Specimen: Nasopharyngeal Swab  Result Value Ref Range Status   SARS Coronavirus 2 NEGATIVE NEGATIVE Final    Comment: (NOTE) SARS-CoV-2  target nucleic acids are NOT DETECTED.  The SARS-CoV-2 RNA is generally detectable in upper and lower respiratory specimens during the acute phase of infection. The lowest concentration of SARS-CoV-2 viral copies this assay can detect is 250 copies / mL. A negative result does not preclude SARS-CoV-2 infection and should not be used as the sole basis for treatment or other patient management decisions.  A negative result may occur with improper specimen collection / handling, submission of specimen other than nasopharyngeal swab, presence of viral mutation(s) within the areas targeted by this assay, and inadequate number of viral copies (<250 copies / mL). A negative result must be combined with clinical observations, patient history, and epidemiological information.  Fact Sheet for Patients:   StrictlyIdeas.no  Fact Sheet for Healthcare Providers: BankingDealers.co.za  This test is not yet approved or  cleared by the [place] FDA and has been authorized for detection and/or diagnosis of SARS-CoV-2 by FDA under an Emergency Use Authorization (EUA).  This EUA will  remain in effect (meaning this test can be used) for the duration of the COVID-19 declaration under Section 564(b)(1) of the Act, 21 U.S.C. section 360bbb-3(b)(1), unless the authorization is terminated or revoked sooner.  Performed at [facility], [place] [address] [place], [place] [address]     Procedures/Studies: No results found.  Labs: BNP (last 3 results) No results for input(s): BNP in the last 8760 hours. Basic Metabolic Panel: Recent Labs  Lab 07/01/19 1040 07/02/19 0400  NA 139 139  K 3.8 3.5  CL 102 104  CO2 28 26  GLUCOSE 115* 99  BUN 17 15  CREATININE 0.63 0.50  CALCIUM 8.9 8.2*   Liver Function Tests: No results for input(s): AST, ALT, ALKPHOS, BILITOT, PROT, ALBUMIN in the last 168 hours. No results for input(s): LIPASE, AMYLASE in the last 168 hours. No results for input(s): AMMONIA in the last 168 hours. CBC: Recent Labs  Lab 07/01/19 1040 07/01/19 1732 07/02/19 0400  WBC 4.4 5.4 7.2  NEUTROABS 2.8 3.6  --   HGB 16.0* 14.6 15.0  HCT 44.1 42.0 42.7  MCV 89.6 91.9 93.2  PLT 206 203 173   Cardiac Enzymes: No results for input(s): CKTOTAL, CKMB, CKMBINDEX, TROPONINI in the last 168 hours. BNP: Invalid input(s): POCBNP CBG: No results for input(s): GLUCAP in the last 168 hours. D-Dimer No results for input(s): DDIMER in the last 72 hours. Hgb A1c No results for input(s): HGBA1C in the last 72 hours. Lipid Profile No results for input(s): CHOL, HDL, LDLCALC, TRIG, CHOLHDL, LDLDIRECT in the last 72 hours. Thyroid function studies No results for input(s): TSH, T4TOTAL, T3FREE, THYROIDAB in the last 72 hours.  Invalid input(s): FREET3 Anemia work up No results for input(s): VITAMINB12, FOLATE, FERRITIN, TIBC, IRON, RETICCTPCT in the last 72 hours. Urinalysis    Component Value Date/Time   COLORURINE YELLOW 10/07/2015 1055   APPEARANCEUR CLEAR 10/07/2015 1055   LABSPEC 1.003 (L) 10/07/2015 1055   PHURINE 7.0 10/07/2015  1055   GLUCOSEU NEGATIVE 10/07/2015 1055   HGBUR NEGATIVE 10/07/2015 1055   BILIRUBINUR NEGATIVE 10/07/2015 1055   KETONESUR NEGATIVE 10/07/2015 1055   PROTEINUR NEGATIVE 10/07/2015 1055   UROBILINOGEN 0.2 06/22/2014 1016   NITRITE NEGATIVE 10/07/2015 1055   LEUKOCYTESUR NEGATIVE 10/07/2015 1055   Sepsis Labs Invalid input(s): PROCALCITONIN,  WBC,  LACTICIDVEN Microbiology Recent Results (from the past 240 hour(s))  SARS Coronavirus 2 by RT PCR (hospital order, performed in [facility] hospital lab) Nasopharyngeal Nasopharyngeal Swab  Status: None   Collection Time: 07/01/19  2:06 PM   Specimen: Nasopharyngeal Swab  Result Value Ref Range Status   SARS Coronavirus 2 NEGATIVE NEGATIVE Final    Comment: (NOTE) SARS-CoV-2 target nucleic acids are NOT DETECTED.  The SARS-CoV-2 RNA is generally detectable in upper and lower respiratory specimens during the acute phase of infection. The lowest concentration of SARS-CoV-2 viral copies this assay can detect is 250 copies / mL. A negative result does not preclude SARS-CoV-2 infection and should not be used as the sole basis for treatment or other patient management decisions.  A negative result may occur with improper specimen collection / handling, submission of specimen other than nasopharyngeal swab, presence of viral mutation(s) within the areas targeted by this assay, and inadequate number of viral copies (<250 copies / mL). A negative result must be combined with clinical observations, patient history, and epidemiological information.  Fact Sheet for Patients:   StrictlyIdeas.no  Fact Sheet for Healthcare Providers: BankingDealers.co.za  This test is not yet approved or  cleared by the [place] FDA and has been authorized for detection and/or diagnosis of SARS-CoV-2 by FDA under an Emergency Use Authorization (EUA).  This EUA will remain in effect (meaning this test can be  used) for the duration of the COVID-19 declaration under Section 564(b)(1) of the Act, 21 U.S.C. section 360bbb-3(b)(1), unless the authorization is terminated or revoked sooner.  Performed at [facility], [place] [address] [place], [place] [address]      Time coordinating discharge: 25  minutes  SIGNED: Antonieta Pert, MD  [facility] 07/03/2019, 11:36 AM  If 7PM-7AM, please contact night-coverage [email or web address]

## 2019-07-03 NOTE — Progress Notes (Signed)
No new swelling or redness noted and patient denies any pain/distress.  Left Ankle- 8.5 inches  Left Calf- 14 inches  Encouraged patient to continue keeping leg elevated and discharge instruction on snake bite also reviewed and given to patient.

## 2019-07-03 NOTE — Progress Notes (Signed)
Discharge instructions given and explained to patient, she verbalized understanding, Denies any pain/distress. No wound or sign of infection noted. Accompanied home by husband.

## 2019-07-09 DIAGNOSIS — Z0100 Encounter for examination of eyes and vision without abnormal findings: Secondary | ICD-10-CM | POA: Diagnosis not present

## 2019-09-02 DIAGNOSIS — R69 Illness, unspecified: Secondary | ICD-10-CM | POA: Diagnosis not present

## 2019-09-11 DIAGNOSIS — I10 Essential (primary) hypertension: Secondary | ICD-10-CM | POA: Diagnosis not present

## 2019-09-11 DIAGNOSIS — E785 Hyperlipidemia, unspecified: Secondary | ICD-10-CM | POA: Diagnosis not present

## 2019-09-11 DIAGNOSIS — Z79899 Other long term (current) drug therapy: Secondary | ICD-10-CM | POA: Diagnosis not present

## 2019-09-16 DIAGNOSIS — M170 Bilateral primary osteoarthritis of knee: Secondary | ICD-10-CM | POA: Diagnosis not present

## 2019-09-16 DIAGNOSIS — Z0000 Encounter for general adult medical examination without abnormal findings: Secondary | ICD-10-CM | POA: Diagnosis not present

## 2019-09-16 DIAGNOSIS — U071 COVID-19: Secondary | ICD-10-CM | POA: Diagnosis not present

## 2019-09-16 DIAGNOSIS — I10 Essential (primary) hypertension: Secondary | ICD-10-CM | POA: Diagnosis not present

## 2019-09-16 DIAGNOSIS — E7849 Other hyperlipidemia: Secondary | ICD-10-CM | POA: Diagnosis not present

## 2019-09-16 DIAGNOSIS — K219 Gastro-esophageal reflux disease without esophagitis: Secondary | ICD-10-CM | POA: Diagnosis not present

## 2019-09-16 DIAGNOSIS — R82998 Other abnormal findings in urine: Secondary | ICD-10-CM | POA: Diagnosis not present

## 2019-09-16 DIAGNOSIS — L80 Vitiligo: Secondary | ICD-10-CM | POA: Diagnosis not present

## 2019-09-16 DIAGNOSIS — R69 Illness, unspecified: Secondary | ICD-10-CM | POA: Diagnosis not present

## 2019-09-16 DIAGNOSIS — R197 Diarrhea, unspecified: Secondary | ICD-10-CM | POA: Diagnosis not present

## 2019-09-17 DIAGNOSIS — N951 Menopausal and female climacteric states: Secondary | ICD-10-CM | POA: Diagnosis not present

## 2019-09-17 DIAGNOSIS — N952 Postmenopausal atrophic vaginitis: Secondary | ICD-10-CM | POA: Diagnosis not present

## 2019-09-17 DIAGNOSIS — Z6827 Body mass index (BMI) 27.0-27.9, adult: Secondary | ICD-10-CM | POA: Diagnosis not present

## 2019-09-17 DIAGNOSIS — Z79890 Hormone replacement therapy (postmenopausal): Secondary | ICD-10-CM | POA: Diagnosis not present

## 2019-09-17 DIAGNOSIS — Z780 Asymptomatic menopausal state: Secondary | ICD-10-CM | POA: Diagnosis not present

## 2019-09-17 DIAGNOSIS — Z01419 Encounter for gynecological examination (general) (routine) without abnormal findings: Secondary | ICD-10-CM | POA: Diagnosis not present

## 2019-10-01 DIAGNOSIS — I788 Other diseases of capillaries: Secondary | ICD-10-CM | POA: Diagnosis not present

## 2019-10-01 DIAGNOSIS — L570 Actinic keratosis: Secondary | ICD-10-CM | POA: Diagnosis not present

## 2019-10-01 DIAGNOSIS — L910 Hypertrophic scar: Secondary | ICD-10-CM | POA: Diagnosis not present

## 2019-10-12 DIAGNOSIS — R69 Illness, unspecified: Secondary | ICD-10-CM | POA: Diagnosis not present

## 2019-10-26 DIAGNOSIS — Z1212 Encounter for screening for malignant neoplasm of rectum: Secondary | ICD-10-CM | POA: Diagnosis not present

## 2019-10-28 DIAGNOSIS — R69 Illness, unspecified: Secondary | ICD-10-CM | POA: Diagnosis not present

## 2019-11-17 ENCOUNTER — Other Ambulatory Visit: Payer: Self-pay | Admitting: Internal Medicine

## 2019-11-17 DIAGNOSIS — Z1231 Encounter for screening mammogram for malignant neoplasm of breast: Secondary | ICD-10-CM

## 2019-11-20 DIAGNOSIS — Z96653 Presence of artificial knee joint, bilateral: Secondary | ICD-10-CM | POA: Diagnosis not present

## 2019-12-31 DIAGNOSIS — J209 Acute bronchitis, unspecified: Secondary | ICD-10-CM | POA: Diagnosis not present

## 2019-12-31 DIAGNOSIS — R0602 Shortness of breath: Secondary | ICD-10-CM | POA: Diagnosis not present

## 2020-01-05 ENCOUNTER — Ambulatory Visit: Payer: Medicare HMO

## 2020-01-07 DIAGNOSIS — J40 Bronchitis, not specified as acute or chronic: Secondary | ICD-10-CM | POA: Diagnosis not present

## 2020-01-07 DIAGNOSIS — J984 Other disorders of lung: Secondary | ICD-10-CM | POA: Diagnosis not present

## 2020-02-01 ENCOUNTER — Encounter: Payer: Self-pay | Admitting: Internal Medicine

## 2020-02-01 ENCOUNTER — Ambulatory Visit (INDEPENDENT_AMBULATORY_CARE_PROVIDER_SITE_OTHER): Payer: Medicare HMO | Admitting: Internal Medicine

## 2020-02-01 ENCOUNTER — Other Ambulatory Visit: Payer: Self-pay

## 2020-02-01 ENCOUNTER — Ambulatory Visit
Admission: RE | Admit: 2020-02-01 | Discharge: 2020-02-01 | Disposition: A | Discharge status: Home / Self Care | Payer: Medicare HMO | Source: Ambulatory Visit | Attending: Internal Medicine | Admitting: Internal Medicine

## 2020-02-01 VITALS — BP 140/80 | HR 80 | Temp 97.1°F | Ht 63.0 in | Wt 145.8 lb

## 2020-02-01 DIAGNOSIS — Z1231 Encounter for screening mammogram for malignant neoplasm of breast: Secondary | ICD-10-CM | POA: Diagnosis not present

## 2020-02-01 DIAGNOSIS — K219 Gastro-esophageal reflux disease without esophagitis: Secondary | ICD-10-CM

## 2020-02-01 DIAGNOSIS — J4520 Mild intermittent asthma, uncomplicated: Secondary | ICD-10-CM | POA: Diagnosis not present

## 2020-02-01 NOTE — Patient Instructions (Addendum)
The patient should have follow up scheduled with myself in 9 months.   Prior to next visit patient should have: Spirometry/Feno  Take the albuterol rescue inhaler every 4 to 6 hours as needed for wheezing or shortness of breath. You can also take it 15 minutes before exercise or exertional activity. Side effects include heart racing or pounding, jitters or anxiety. If you have a history of an irregular heart rhythm, it can make this worse. Can also give some patients a hard time sleeping.  To inhale the aerosol using an inhaler, follow these steps:  1. Remove the protective dust cap from the end of the mouthpiece. If the dust cap was not placed on the mouthpiece, check the mouthpiece for dirt or other objects. Be sure that the canister is fully and firmly inserted in the mouthpiece. 2. If you are using the inhaler for the first time or if you have not used the inhaler in more than 14 days, you will need to prime it. You may also need to prime the inhaler if it has been dropped. Ask your pharmacist or check the manufacturer's information if this happens. To prime the inhaler, shake it well and then press down on the canister 4 times to release 4 sprays into the air, away from your face. Be careful not to get albuterol in your eyes. 3. Shake the inhaler well. 4. Breathe out as completely as possible through your mouth. 4. Hold the canister with the mouthpiece on the bottom, facing you and the canister pointing upward. Place the open end of the mouthpiece into your mouth. Close your lips tightly around the mouthpiece. 6. Breathe in slowly and deeply through the mouthpiece.At the same time, press down once on the container to spray the medication into your mouth. 7. Try to hold your breath for 10 seconds. remove the inhaler, and breathe out slowly. 8. If you were told to use 2 puffs, wait 1 minute and then repeat steps 3-7. 9. Replace the protective cap on the inhaler. 10. Clean your inhaler  regularly. Follow the manufacturer's directions carefully and ask your doctor or pharmacist if you have any questions about cleaning your inhaler.  Check the back of the inhaler to keep track of the total number of doses left on the inhaler.      By learning about asthma and how it can be controlled, you take an important step toward managing this disease. Work closely with your asthma care team to learn all you can about your asthma, how to avoid triggers, what your medications do, and how to take them correctly. With proper care, you can live free of asthma symptoms and maintain a normal, healthy lifestyle.   What is asthma? Asthma is a chronic disease that affects the airways of the lungs. During normal breathing, the bands of muscle that surround the airways are relaxed and air moves freely. During an asthma episode or "attack," there are three main changes that stop air from moving easily through the airways:  The bands of muscle that surround the airways tighten and make the airways narrow. This tightening is called bronchospasm.   The lining of the airways becomes swollen or inflamed.   The cells that line the airways produce more mucus, which is thicker than normal and clogs the airways.  These three factors - bronchospasm, inflammation, and mucus production - cause symptoms such as difficulty breathing, wheezing, and coughing.  What are the most common symptoms of asthma? Asthma symptoms are not  the same for everyone. They can even change from episode to episode in the same person. Also, you may have only one symptom of asthma, such as cough, but another person may have all the symptoms of asthma. It is important to know all the symptoms of asthma and to be aware that your asthma can present in any of these ways at any time. The most common symptoms include: . Coughing, especially at night  . Shortness of breath  . Wheezing  . Chest tightness, pain, or pressure   Who is affected  by asthma? Asthma affects 22 million Americans; about 6 million of these are children under age 51. People who have a family history of asthma have an increased risk of developing the disease. Asthma is also more common in people who have allergies or who are exposed to tobacco smoke. However, anyone can develop asthma at any time. Some people may have asthma all of their lives, while others may develop it as adults.  What causes asthma? The airways in a person with asthma are very sensitive and react to many things, or "triggers." Contact with these triggers causes asthma symptoms. One of the most important parts of asthma control is to identify your triggers and then avoid them when possible. The only trigger you do not want to avoid is exercise. Pre-treatment with medicines before exercise can allow you to stay active yet avoid asthma symptoms. Common asthma triggers include: 1. Infections (colds, viruses, flu, sinus infections)  2. Exercise  3. Weather (changes in temperature and/or humidity, cold air)  4. Tobacco smoke  5. Allergens (dust mites, pollens, pets, mold spores, cockroaches, and sometimes foods)  6. Irritants (strong odors from cleaning products, perfume, wood smoke, air pollution)  7. Strong emotions such as crying or laughing hard  8. Some medications   How is asthma diagnosed? To diagnose asthma, your doctor will first review your medical history, family history, and symptoms. Your doctor will want to know any past history of breathing problems you may have had, as well as a family history of asthma, allergies, eczema (a bumpy, itchy skin rash caused by allergies), or other lung disease. It is important that you describe your symptoms in detail (cough, wheeze, shortness of breath, chest tightness), including when and how often they occur. The doctor will perform a physical examination and listen to your heart and lungs. He or she may also order breathing tests, allergy tests,  blood tests, and chest and sinus X-rays. The tests will find out if you do have asthma and if there are any other conditions that are contributing factors.  How is asthma treated? Asthma can be controlled, but not cured. It is not normal to have frequent symptoms, trouble sleeping, or trouble completing tasks. Appropriate asthma care will prevent symptoms and visits to the emergency room and hospital. Asthma medicines are one of the mainstays of asthma treatment. The drugs used to treat asthma are explained below.  Anti-inflammatories: These are the most important drugs for most people with asthma. Anti-inflammatory drugs reduce swelling and mucus production in the airways. As a result, airways are less sensitive and less likely to react to triggers. These medications need to be taken daily and may need to be taken for several weeks before they begin to control asthma. Anti-inflammatory medicines lead to fewer symptoms, better airflow, less sensitive airways, less airway damage, and fewer asthma attacks. If taken every day, they CONTROL or prevent asthma symptoms.   Bronchodilators: These drugs relax  the muscle bands that tighten around the airways. This action opens the airways, letting more air in and out of the lungs and improving breathing. Bronchodilators also help clear mucus from the lungs. As the airways open, the mucus moves more freely and can be coughed out more easily. In short-acting forms, bronchodilators RELIEVE or stop asthma symptoms by quickly opening the airways and are very helpful during an asthma episode. In long-acting forms, bronchodilators provide CONTROL of asthma symptoms and prevent asthma episodes.  Asthma drugs can be taken in a variety of ways. Inhaling the medications by using a metered dose inhaler, dry powder inhaler, or nebulizer is one way of taking asthma medicines. Oral medicines (pills or liquids you swallow) may also be prescribed.  Asthma severity Asthma is  classified as either "intermittent" (comes and goes) or "persistent" (lasting). Persistent asthma is further described as being mild, moderate, or severe. The severity of asthma is based on how often you have symptoms both during the day and night, as well as by the results of lung function tests and by how well you can perform activities. The "severity" of asthma refers to how "intense" or "strong" your asthma is.  Asthma control Asthma control is the goal of asthma treatment. Regardless of your asthma severity, it may or may not be controlled. Asthma control means: . You are able to do everything you want to do at work and home  . You have no (or minimal) asthma symptoms  . You do not wake up from your sleep or earlier than usual in the morning due to asthma  . You rarely need to use your reliever medicine (inhaler)  Another major part of your treatment is that you are happy with your asthma care and believe your asthma is controlled.  Monitoring symptoms A key part of treatment is keeping track of how well your lungs are working. Monitoring your symptoms  what they are, how and when they happen, and how severe they are  is an important part of being able to control your asthma.  Sometimes asthma is monitored using a peak flow meter. A peak flow (PF) meter measures how fast the air comes out of your lungs. It can help you know when your asthma is getting worse, sometimes even before you have symptoms. By taking daily peak flow readings, you can learn when to adjust medications to keep asthma under good control. It is also used to create your asthma action plan (see below). Your doctor can use your peak flow readings to adjust your treatment plan in some cases.  Asthma Action Plan Based on your history and asthma severity, you and your doctor will develop a care plan called an "asthma action plan." The asthma action plan describes when and how to use your medicines, actions to take when asthma  worsens, and when to seek emergency care. Make sure you understand this plan. If you do not, ask your asthma care provider any questions you may have. Your asthma action plan is one of the keys to controlling asthma. Keep it readily available to remind you of what you need to do every day to control asthma and what you need to do when symptoms occur.  Goals of asthma therapy These are the goals of asthma treatment: . Live an active, normal life  . Prevent chronic and troublesome symptoms  . Attend work or school every day  . Perform daily activities without difficulty  . Stop urgent visits to the doctor, emergency  department, or hospital  . Use and adjust medications to control asthma with few or no side effects

## 2020-02-01 NOTE — Progress Notes (Signed)
[address] Danielle Hayes    [id number]    07/30/1950  Primary Care Physician:Avva, Steva Ready, MD  Referring Physician: Reginold Agent, NP [address] [place],  [place] [address] Reason for Consultation: bronchitis Date of Consultation: 02/01/2020  Chief complaint:   Chief Complaint  Patient presents with  . Consult    Bronchitis for 4 weeks.  Gets yearly and lasts that long.  UCC cxr showed scarring on lungs.     HPI: Danielle Hayes is a 70 y.o. woman who is a never smoker who presents with recurrent bronchitis. She has been getting recurrent bronchitis almost annually since junior high. She notes deep cough, some wheezing, but denies shortness of breath. It takes 3-4 weeks for the symptoms to improve - usually starts with sore throat and then chest symptoms. She brings up mucus with this that she usually swallows.   She was given an albuterol inhaler by urgent care but isn't sure if it helped, but she also isn't sure if she was taking it properly. She has been given an inhaler in the past by her PCP with concern for exercise induced asthma.  She exercises regularly on eliptical and recently went cross country skiing.   She does get allergic rhinitis in the spring time for which she takes over the counter medications like loratidine.  Recent episode in [place] of bronchitis where she went to urgent care and had diffuse wheezing and rhonchi. Treated with steroids and antibiotics. Chest xray done and she was told it showed chronic changes.    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   Social History   Occupational History  . Occupation: retired  Tobacco Use  . Smoking status: Never Smoker  . Smokeless tobacco: Never Used  Vaping Use  . Vaping Use: Never used  Substance and Sexual Activity  . Alcohol use: Yes     Alcohol/week: 6.0 standard drinks    Types: 6 Glasses of wine per week    Comment: weekly  . Drug use: No  . Sexual activity: Not on file    Relevant family history:  Family History  Problem Relation Age of Onset  . Heart disease Mother   . Colon cancer Neg Hx   . Pancreatic cancer Neg Hx   . Rectal cancer Neg Hx   . Stomach cancer Neg Hx     Past Medical History:  Diagnosis Date  . Anxiety   . Arthritis   . Bronchitis   . C. difficile diarrhea 10/2014   states was related to antibiotic-? cefdinir  . Complication of anesthesia    has big time phobia from nausea vomting last time got sick was at 70 years old   . Dysrhythmia    PVC's  . Ear infection 10/2015  . Hyperlipidemia   . Hypertension   . PONV (postoperative nausea and vomiting)     Past Surgical History:  Procedure Laterality Date  . KNEE ARTHROSCOPY     right and left  . KNEE ARTHROSCOPY     right, left  . KNEE CLOSED REDUCTION Left 08/09/2014   Procedure: LEFT KNEE CLOSED MANIPULATION ;  Surgeon: Gaynelle Arabian, MD;  Location: [facility];  Service: Orthopedics;  Laterality: Left;  . LEEP    . TONSILLECTOMY    . TOTAL KNEE ARTHROPLASTY Left 06/28/2014   Procedure: LEFT TOTAL KNEE  ARTHROPLASTY;  Surgeon: Gaynelle Arabian, MD;  [facility];  Service: Orthopedics;  Laterality: Left;  . TOTAL KNEE ARTHROPLASTY Right 10/17/2015   Procedure: RIGHT TOTAL KNEE ARTHROPLASTY;  Surgeon: Gaynelle Arabian, MD;  [facility];  Service: Orthopedics;  Laterality: Right;     Physical Exam: Blood pressure 140/80, pulse 80, temperature (!) 97.1 F (36.2 C), temperature source Temporal, height 5\' 3"  (1.6 m), weight 145 lb 12.8 oz (66.1 kg), SpO2 99 %. Gen:      No acute distress ENT:  +cobblestoning in the oropharynx, no nasal polyps, mucus membranes moist Lungs:    No increased respiratory effort, symmetric chest wall excursion, clear to auscultation bilaterally, no wheezes or crackles CV:         Regular rate and rhythm; no  murmurs, rubs, or gallops.  No pedal edema Abd:      + bowel sounds; soft, non-tender; no distension MSK: no acute synovitis of DIP or PIP joints, no mechanics hands.  Skin:      Warm and dry; no rashes Neuro: normal speech, no focal facial asymmetry Psych: alert and oriented x3, normal mood and affect   Data Reviewed/Medical Decision Making:  Independent interpretation of tests: Imaging: Report of chest xray from urgent care [facility] showed dextroscoliosis, no acute process.   PFTs: None on file  Labs:  Lab Results  Component Value Date   WBC 7.2 07/02/2019   HGB 15.0 07/02/2019   HCT 42.7 07/02/2019   MCV 93.2 07/02/2019   PLT 173 07/02/2019   Lab Results  Component Value Date   NA 139 07/02/2019   K 3.5 07/02/2019   CL 104 07/02/2019   CO2 26 07/02/2019     Immunization status:  Immunization History  Administered Date(s) Administered  . Influenza,inj,Quad PF,6+ Mos 10/02/2019  . PFIZER(Purple Top)SARS-COV-2 Vaccination 04/28/2019, 05/19/2019, 12/09/2019    . I reviewed prior external note(s) from urgent care, hospital stay, primary care . I reviewed the result(s) of the labs and imaging as noted above. . I have ordered pft   Assessment:  Mild intermittent asthma Seasonal allergic rhinitis  Plan/Recommendations:  Suspect her symptoms are mild asthma exacerbated primarily by URIs. We discussed prn albuterol for now, and that in the fall she may need prn ICS or ICS-LABA per GINA guidelines. Will obtain spirometry and FeNO. I will contact her with results.   We discussed disease management and progression at length today for asthma. Inhaler teaching performed.   I spent 45 minutes in the care of this patient today including pre-charting, chart review, review of results, face-to-face care, coordination of care and communication with consultants etc.).   Return to Care: Return in about 9 months (around 10/31/2020).  [name], MD Pulmonary and  [place]  CC: Reginold Agent, NP

## 2020-02-04 ENCOUNTER — Emergency Department (HOSPITAL_COMMUNITY): Payer: Medicare HMO

## 2020-02-04 ENCOUNTER — Emergency Department (HOSPITAL_COMMUNITY)
Admission: EM | Admit: 2020-02-04 | Discharge: 2020-02-05 | Disposition: A | Discharge status: Home / Self Care | Payer: Medicare HMO | Attending: Emergency Medicine | Admitting: Emergency Medicine

## 2020-02-04 DIAGNOSIS — Z79899 Other long term (current) drug therapy: Secondary | ICD-10-CM | POA: Diagnosis not present

## 2020-02-04 DIAGNOSIS — Z20822 Contact with and (suspected) exposure to covid-19: Secondary | ICD-10-CM | POA: Diagnosis not present

## 2020-02-04 DIAGNOSIS — J9811 Atelectasis: Secondary | ICD-10-CM | POA: Diagnosis not present

## 2020-02-04 DIAGNOSIS — I10 Essential (primary) hypertension: Secondary | ICD-10-CM | POA: Insufficient documentation

## 2020-02-04 DIAGNOSIS — Z7982 Long term (current) use of aspirin: Secondary | ICD-10-CM | POA: Diagnosis not present

## 2020-02-04 DIAGNOSIS — R0902 Hypoxemia: Secondary | ICD-10-CM | POA: Diagnosis not present

## 2020-02-04 DIAGNOSIS — Z96653 Presence of artificial knee joint, bilateral: Secondary | ICD-10-CM | POA: Insufficient documentation

## 2020-02-04 DIAGNOSIS — R0789 Other chest pain: Secondary | ICD-10-CM | POA: Diagnosis not present

## 2020-02-04 DIAGNOSIS — R079 Chest pain, unspecified: Secondary | ICD-10-CM | POA: Diagnosis not present

## 2020-02-04 LAB — BASIC METABOLIC PANEL
Anion gap: 11 (ref 5–15)
BUN: 13 mg/dL (ref 8–23)
CO2: 26 mmol/L (ref 22–32)
Calcium: 8.7 mg/dL — ABNORMAL LOW (ref 8.9–10.3)
Chloride: 103 mmol/L (ref 98–111)
Creatinine, Ser: 0.76 mg/dL (ref 0.44–1.00)
GFR, Estimated: >60 mL/min (ref >60)
Glucose, Bld: 113 mg/dL — ABNORMAL HIGH (ref 70–99)
Potassium: 3.5 mmol/L (ref 3.5–5.1)
Sodium: 140 mmol/L (ref 135–145)

## 2020-02-04 LAB — CBC
HCT: 42.8 % (ref 36.0–46.0)
Hemoglobin: 15.1 g/dL — ABNORMAL HIGH (ref 12.0–15.0)
MCH: 32.4 pg (ref 26.0–34.0)
MCHC: 35.3 g/dL (ref 30.0–36.0)
MCV: 91.8 fL (ref 80.0–100.0)
Platelets: 236 10*3/uL (ref 150–400)
RBC: 4.66 MIL/uL (ref 3.87–5.11)
RDW: 13.3 % (ref 11.5–15.5)
WBC: 5.8 10*3/uL (ref 4.0–10.5)
nRBC: 0 % (ref 0.0–0.2)

## 2020-02-04 LAB — TROPONIN I (HIGH SENSITIVITY): Troponin I (High Sensitivity): 5 ng/L (ref <18)

## 2020-02-04 LAB — D-DIMER, QUANTITATIVE: D-Dimer, Quant: 0.3 ug/mL-FEU (ref 0.00–0.50)

## 2020-02-04 MED ORDER — KETOROLAC TROMETHAMINE 30 MG/ML IJ SOLN
30.0000 mg | Freq: Once | INTRAMUSCULAR | Status: AC
  Administered 2020-02-05: 30 mg via INTRAVENOUS
  Filled 2020-02-04: qty 1

## 2020-02-04 MED ORDER — DEXAMETHASONE SODIUM PHOSPHATE 10 MG/ML IJ SOLN
10.0000 mg | Freq: Once | INTRAMUSCULAR | Status: AC
  Administered 2020-02-05: 10 mg via INTRAVENOUS
  Filled 2020-02-04: qty 1

## 2020-02-04 NOTE — ED Provider Notes (Signed)
[facility] Provider Note   CSN: [id number] Arrival date & time: 02/04/20  2048     History Chief Complaint  Patient presents with  . Chest Pain    Danielle Hayes is a 70 y.o. female.  Pt presents to the ED today with chest pain.  Pt said cp is only when she takes a deep breath.  She said she was stretching at 1300 today when it started.  Pt denies sob.  No f/c.  She has been fully vaccinated + booster.        Past Medical History:  Diagnosis Date  . Anxiety   . Arthritis   . Bronchitis   . C. difficile diarrhea 10/2014   states was related to antibiotic-? cefdinir  . Complication of anesthesia    has big time phobia from nausea vomting last time got sick was at 70 years old   . Dysrhythmia    PVC's  . Ear infection 10/2015  . Hyperlipidemia   . Hypertension   . PONV (postoperative nausea and vomiting)     Patient Active Problem List   Diagnosis Date Noted  . Snake bite 07/01/2019  . Essential hypertension 07/01/2019  . Depression 07/01/2019  . Arthrofibrosis of total knee arthroplasty ([facility]) 08/09/2014  . OA (osteoarthritis) of knee 06/28/2014    Past Surgical History:  Procedure Laterality Date  . KNEE ARTHROSCOPY     right and left  . KNEE ARTHROSCOPY     right, left  . KNEE CLOSED REDUCTION Left 08/09/2014   Procedure: LEFT KNEE CLOSED MANIPULATION ;  Surgeon: Gaynelle Arabian, MD;  [facility];  Service: Orthopedics;  Laterality: Left;  . LEEP    . TONSILLECTOMY    . TOTAL KNEE ARTHROPLASTY Left 06/28/2014   Procedure: LEFT TOTAL KNEE ARTHROPLASTY;  Surgeon: Gaynelle Arabian, MD;  [facility];  Service: Orthopedics;  Laterality: Left;  . TOTAL KNEE ARTHROPLASTY Right 10/17/2015   Procedure: RIGHT TOTAL KNEE ARTHROPLASTY;  Surgeon: Gaynelle Arabian, MD;  [facility];  Service: Orthopedics;  Laterality: Right;     OB History   No obstetric history on file.     Family History  Problem Relation Age  of Onset  . Heart disease Mother   . Colon cancer Neg Hx   . Pancreatic cancer Neg Hx   . Rectal cancer Neg Hx   . Stomach cancer Neg Hx     Social History   Tobacco Use  . Smoking status: Never Smoker  . Smokeless tobacco: Never Used  Vaping Use  . Vaping Use: Never used  Substance Use Topics  . Alcohol use: Yes    Alcohol/week: 6.0 standard drinks    Types: 6 Glasses of wine per week    Comment: weekly  . Drug use: No    Home Medications Prior to Admission medications   Medication Sig Start Date End Date Taking? Authorizing Provider  albuterol (VENTOLIN HFA) 108 (90 Base) MCG/ACT inhaler Inhale 1-2 puffs into the lungs every 4 (four) hours as needed for wheezing or shortness of breath. 12/31/19  Yes [provider]  amLODipine (NORVASC) 2.5 MG tablet Take 2.5 mg by mouth every morning.    Yes [provider]  aspirin 325 MG tablet Take 325 mg by mouth every 6 (six) hours as needed for headache.   Yes [provider]  atorvastatin (LIPITOR) 40 MG tablet Take 40 mg by mouth every morning.    Yes [provider]  BLACK COHOSH EXTRACT PO Take by mouth.   Yes [provider]  buPROPion (WELLBUTRIN XL) 150 MG 24 hr tablet Take 150 mg by mouth daily.   Yes [provider]  cholecalciferol (VITAMIN D3) 25 MCG (1000 UNIT) tablet Take 2,000 Units by mouth daily.   Yes [provider]  estradiol (ESTRACE) 0.1 MG/GM vaginal cream Place 1 Applicatorful vaginally 2 (two) times a week.   Yes [provider]  estradiol (VIVELLE-DOT) 0.0375 MG/24HR Place 1 patch onto the skin 2 (two) times a week. 03/24/19  Yes [provider]  Glucosamine HCl 1000 MG TABS Take 1,000 mg by mouth in the morning and at bedtime.   Yes [provider]  ibuprofen (ADVIL) 200 MG tablet Take 400 mg by mouth every 6 (six) hours as needed for headache or moderate pain.   Yes [provider]  Multiple Vitamin (MULTI-VITAMIN  DAILY PO) Multi Vitamin   Yes [provider]  Omega 3 1200 MG CAPS Take 1,200 mg by mouth daily.   Yes [provider]  OVER THE COUNTER MEDICATION Take 2-3 mLs by mouth daily. Psyllium Husk Powder (soluble fiber) 2-3 tsp   Yes [provider]  OVER THE COUNTER MEDICATION Take 1 tablet by mouth daily. Suprema dophilus (probiotic)   Yes [provider]  progesterone (PROMETRIUM) 200 MG capsule Take 200 mg by mouth See admin instructions. Daily for 14 days every 3 months   Yes [provider]  temazepam (RESTORIL) 15 MG capsule Take 15 mg by mouth at bedtime as needed for sleep.   Yes [provider]  triamterene-hydrochlorothiazide (MAXZIDE-25) 37.5-25 MG per tablet Take 0.5 tablets by mouth every morning.    Yes [provider]    Allergies    Cefdinir, Chlorhexidine, Hydrocodone, Oxycodone, Synvisc [hylan g-f 20], and Xarelto [rivaroxaban]  Review of Systems   Review of Systems  Cardiovascular: Positive for chest pain.  All other systems reviewed and are negative.   Physical Exam Updated Vital Signs BP 125/88   Pulse 89   Temp 98.6 F (37 C) (Oral)   Resp (!) 21   Ht 5\' 3"  (1.6 m)   Wt 64.4 kg   SpO2 96%   BMI 25.15 kg/m   Physical Exam Vitals and nursing note reviewed.  Constitutional:      Appearance: She is well-developed.  HENT:     Head: Normocephalic and atraumatic.  Eyes:     Extraocular Movements: Extraocular movements intact.     Pupils: Pupils are equal, round, and reactive to light.  Cardiovascular:     Rate and Rhythm: Normal rate and regular rhythm.     Heart sounds: Normal heart sounds.  Pulmonary:     Effort: Pulmonary effort is normal.     Breath sounds: Normal breath sounds.  Chest:    Abdominal:     General: Bowel sounds are normal.     Palpations: Abdomen is soft.  Musculoskeletal:        General: Normal range of motion.     Cervical back: Normal range of motion and neck supple.   Skin:    General: Skin is warm.     Capillary Refill: Capillary refill takes less than 2 seconds.  Neurological:     General: No focal deficit present.     Mental Status: She is alert and oriented to person, place, and time.  Psychiatric:        Mood and Affect: Mood normal.  Behavior: Behavior normal.     ED Results / Procedures / Treatments   Labs (all labs ordered are listed, but only abnormal results are displayed) Labs Reviewed  BASIC METABOLIC PANEL - Abnormal; Notable for the following components:      Result Value   Glucose, Bld 113 (*)    Calcium 8.7 (*)    All other components within normal limits  CBC - Abnormal; Notable for the following components:   Hemoglobin 15.1 (*)    All other components within normal limits  SARS CORONAVIRUS 2 (TAT 6-24 HRS)  D-DIMER, QUANTITATIVE (NOT AT [facility])  TROPONIN I (HIGH SENSITIVITY)  TROPONIN I (HIGH SENSITIVITY)    EKG EKG Interpretation  Date/Time:  Thursday February 04 2020 20:55:56 EST Ventricular Rate:  97 PR Interval:    QRS Duration: 86 QT Interval:  342 QTC Calculation: 435 R Axis:   128 Text Interpretation: Sinus rhythm Probable left atrial enlargement Anteroseptal infarct, age indeterminate ST elevation, consider inferior injury Lateral leads are also involved Since last tracing rate faster Confirmed by Isla Pence [id number]) on 02/04/2020 9:56:35 PM   Radiology DG Chest Port 1 View  Result Date: 02/04/2020 CLINICAL DATA:  Chest pain EXAM: PORTABLE CHEST 1 VIEW COMPARISON:  None. FINDINGS: Cardiac shadow is within normal limits. Mild atelectatic changes are noted in the left lung base. No acute bony abnormality is noted. IMPRESSION: Mild left basilar atelectasis. Electronically Signed   By: Inez Catalina M.D.   On: 02/04/2020 21:26    Procedures Procedures   Medications Ordered in ED Medications  ketorolac (TORADOL) 30 MG/ML injection 30 mg (has no administration in time range)  dexamethasone  (DECADRON) injection 10 mg (has no administration in time range)    ED Course  I have reviewed the triage vital signs and the nursing notes.  Pertinent labs & imaging results that were available during my care of the patient were reviewed by me and considered in my medical decision making (see chart for details).    MDM Rules/Calculators/A&P                          1st troponin negative.  Sx atypical for cp.  Pt will get a 2nd troponin.  If that is ok, I think she can go home.  Return if worse.  F/u with pcp. Final Clinical Impression(s) / ED Diagnoses Final diagnoses:  Atypical chest pain    Rx / DC Orders ED Discharge Orders    None       Isla Pence, MD 02/04/20 2352

## 2020-02-04 NOTE — ED Triage Notes (Signed)
Pt arrived via EMS with complaints of chest pain. EMS reports pt states chest pain is not at rest, only when pt takes a deep breath. Pt was stretching at 1:00 pm at the [facility], and initially though it was heart burn. Pt reports pain radiated to her shoulder blades. Pts vitals have been stable via ems. Pt reports her home is being insulated, and premold was found. Pt reports her eyes were irritated since this insulation, she also reports a headache.

## 2020-02-04 NOTE — Discharge Instructions (Addendum)
Follow-up with your primary doctor in the next few days, and return to the ER if symptoms significantly worsen or change.

## 2020-02-05 ENCOUNTER — Telehealth: Payer: Self-pay | Admitting: Internal Medicine

## 2020-02-05 LAB — TROPONIN I (HIGH SENSITIVITY): Troponin I (High Sensitivity): 5 ng/L (ref <18)

## 2020-02-05 LAB — SARS CORONAVIRUS 2 (TAT 6-24 HRS): SARS Coronavirus 2: NEGATIVE

## 2020-02-05 NOTE — ED Notes (Signed)
Patient verbalized understanding of discharge instructions. Opportunity for questions and answers.  

## 2020-02-05 NOTE — ED Notes (Signed)
Pt educated by [employer] on her diagnosis, room for questions and concerns. Md recommended pt follow up with a cardiologist.

## 2020-02-05 NOTE — Telephone Encounter (Signed)
Pt is wanting to leave a message in regards to that yesterday evening she was having chest pain then it migrated to the back of her neck and shoulders, and they called 911 and she was sent to the hospital and determined it was not a heart attack but they believe that it was pleurisy, stated that she was injected w/ steroids and high dose ibuprofen. Also, they performed troponin (x2) to compare her levels. States she was released around 3 a.m. this morning but wanted to inform Dr. Shearon Stalls of the event that occurred yesterday evening around 9 p.m. on (02/04/20). Pls regard [phone].

## 2020-02-05 NOTE — Telephone Encounter (Signed)
Spoke with patient. She verbalized understanding. Will await Dr. Mauricio Po recommendations.

## 2020-02-05 NOTE — Telephone Encounter (Signed)
I reviewed the emergency room records.  Cardiac enzymes were negative chest x-ray showed no acute process.  Felt to be atypical chest pain and was recommended to follow-up with primary care provider I agree with these recommendations it does not sound like this is related to her asthma. Typically pleurisy is pain when she takes in a deep breath, hopefully she will continue to improve.  Would definitely follow-up with her primary care provider.  She can use warm compresses to the affected side.  If symptoms do not improve will need sooner follow-up and evaluation  Please contact office for sooner follow up if symptoms do not improve or worsen or seek emergency care   We will route to PCP and Dr. Shearon Stalls for [facility]

## 2020-02-05 NOTE — Telephone Encounter (Signed)
Spoke with patient. She stated that yesterday afternoon, she experienced some right side chest pain that spread to her right shoulder. She thought it was a pulled muscle so she went to the [facility] to do some stretching exercises. The pain continued so she called 911.   She went to the ED last night. They completed lab work, EKG and a chest xray.  ED doctor advised her that all of her tests came back normal and she was discharged this morning.   She has recently had some insulation done in her house. The workers informed her that she does have mold in the house. They are in the process of removing this. She is now wearing a mask in the house while the work is being completed.   She has concerns about possible pleurisy, especially with the work going on in her house.   She is feeling much better this morning and is not having any chest pain. Denied any increased coughing, runny nose or SOB. She wants someone from pulmonary to take a look at the ED note. I did advise her that ND is out of the office until Monday but I would send the message to one of the NPs for review for piece of mind for her. She verbalized understanding.   TP, can you please take a look at her ED note? Thanks!

## 2020-02-10 NOTE — Telephone Encounter (Signed)
Agree that this is most likely not related to her lungs. Suspect either musculoskeletal if with stretching vs pleurisy. Either way she can try ibuprofen 600 mg up to three times a day as needed for pain for a few days with food.

## 2020-02-10 NOTE — Telephone Encounter (Signed)
Called and spoke with pt and she is aware of ND recs.  She will try this to see if this helps. Nothing further is needed.

## 2020-02-19 ENCOUNTER — Other Ambulatory Visit: Payer: Self-pay

## 2020-02-19 ENCOUNTER — Encounter: Payer: Self-pay | Admitting: Internal Medicine

## 2020-02-19 ENCOUNTER — Telehealth: Payer: Self-pay | Admitting: Internal Medicine

## 2020-02-19 ENCOUNTER — Ambulatory Visit (INDEPENDENT_AMBULATORY_CARE_PROVIDER_SITE_OTHER): Payer: Medicare HMO | Admitting: Internal Medicine

## 2020-02-19 DIAGNOSIS — J4520 Mild intermittent asthma, uncomplicated: Secondary | ICD-10-CM

## 2020-02-19 DIAGNOSIS — J301 Allergic rhinitis due to pollen: Secondary | ICD-10-CM

## 2020-02-19 NOTE — Progress Notes (Signed)
Danielle Hayes    [id number]    05-Aug-1950  Primary Care Physician:Avva, Steva Ready, MD Date of Appointment: 02/19/2020 Established Patient Visit  Chief complaint:   Chief Complaint  Patient presents with  . Acute Visit    Pt felt like she got choked on something a few days ago. She felt like this was a lump in her throat.  Then she felt like it was the start of bronchitis.  She has no other symptoms.    I connected with@ on 02/19/2020 by telephone  enabled telemedicine application and verified that I am speaking with the correct person using two identifiers. Patient is at home, Physician is in office.    I discussed the limitations of evaluation and management by telemedicine. The patient expressed understanding and agreed to proceed.   HPI: Danielle Hayes is a 70 y.o. woman with mild intermittent asthma.   Interval Updates: Telephone visit today for sore throat. Feels globus sensation which she gets a few times a year. No fevers chills. No cough or shortness of breath. But sometimes this feeling turns into an upper respiratory infection. She does have post nasal drainage. No itchy/watery eyes.   I have reviewed the patient's family social and past medical history and updated as appropriate.   Past Medical History:  Diagnosis Date  . Anxiety   . Arthritis   . Bronchitis   . C. difficile diarrhea 10/2014   states was related to antibiotic-? cefdinir  . Complication of anesthesia    has big time phobia from nausea vomting last time got sick was at 70 years old   . Dysrhythmia    PVC's  . Ear infection 10/2015  . Hyperlipidemia   . Hypertension   . PONV (postoperative nausea and vomiting)     Past Surgical History:  Procedure Laterality Date  . KNEE ARTHROSCOPY     right and left  . KNEE ARTHROSCOPY     right, left  . KNEE CLOSED REDUCTION Left 08/09/2014   Procedure: LEFT KNEE CLOSED MANIPULATION ;  Surgeon: Gaynelle Arabian, MD;  [facility]: [facility];   Service: Orthopedics;  Laterality: Left;  . LEEP    . TONSILLECTOMY    . TOTAL KNEE ARTHROPLASTY Left 06/28/2014   Procedure: LEFT TOTAL KNEE ARTHROPLASTY;  Surgeon: Gaynelle Arabian, MD;  [facility]: [facility];  Service: Orthopedics;  Laterality: Left;  . TOTAL KNEE ARTHROPLASTY Right 10/17/2015   Procedure: RIGHT TOTAL KNEE ARTHROPLASTY;  Surgeon: Gaynelle Arabian, MD;  [facility];  Service: Orthopedics;  Laterality: Right;    Family History  Problem Relation Age of Onset  . Heart disease Mother   . Colon cancer Neg Hx   . Pancreatic cancer Neg Hx   . Rectal cancer Neg Hx   . Stomach cancer Neg Hx     Social History   Occupational History  . Occupation: retired  Tobacco Use  . Smoking status: Never Smoker  . Smokeless tobacco: Never Used  Vaping Use  . Vaping Use: Never used  Substance and Sexual Activity  . Alcohol use: Yes    Alcohol/week: 6.0 standard drinks    Types: 6 Glasses of wine per week    Comment: weekly  . Drug use: No  . Sexual activity: Not on file     Physical Exam: There were no vitals taken for this visit.  No respiratory distress Speaks in full sentences No audible wheezing No voice hoarseness.   Data Reviewed:  PFTs: None   Labs:  Immunization status: Immunization History  Administered Date(s) Administered  . Influenza, High Dose Seasonal PF 10/30/2017, 10/16/2018  . Influenza,inj,Quad PF,6+ Mos 10/02/2019  . PFIZER(Purple Top)SARS-COV-2 Vaccination 04/28/2019, 05/19/2019, 12/09/2019  . Zoster 04/03/2012  . Zoster Recombinat (Shingrix) 12/21/2017, 06/18/2018    Assessment:  Mild intermittent asthma Allergic rhinitis   Plan/Recommendations: Start taking flonase, cetirizine for the sore throat. Start warm salt water gargles.   We will follow up after PFTs as scheduled.    I spent 21 minutes on 02/19/2020 in care of this patient including face to face time and non-face to face time spent charting, review of outside records, and  coordination of care.   Return to Care:  As scheduled.   [name], MD Pulmonary and [place]

## 2020-02-19 NOTE — Telephone Encounter (Signed)
Called and spoke with patient.  She states she swallowed something wrong on Wednesday and thought the lump in her throat was from that. But this morning she states its more like when she get bronchitis. She gets a lump then some congestion but it goes straight to her chest.  Patient states not having any other symptoms at this time but the lump in through and occasional cough to try to clear her throat.  She is not having any fevers and does not have shortness of breath. She has not started any OTC medications.  Dr. Shearon Stalls please advise on recommendations.

## 2020-02-19 NOTE — Patient Instructions (Addendum)
Start taking flonase, cetirizine for the sore throat. Start warm salt water gargles.   Flonase - 1 spray on each side of your nose twice a day for first week, then 1 spray on each side.   Instructions for use:  If you also use a saline nasal spray or rinse, use that first.  Position the head with the chin slightly tucked. Use the right hand to spray into the left nostril and the right hand to spray into the left nostril.   Point the bottle away from the septum of your nose (cartilage that divides the two sides of your nose).   Hold the nostril closed on the opposite side from where you will spray  Spray once and gently sniff to pull the medicine into the higher parts of your nose.  Don't sniff too hard as the medicine will drain down the back of your throat instead.  Repeat with a second spray on the same side if prescribed.  Repeat on the other side of your nose.

## 2020-02-19 NOTE — Telephone Encounter (Signed)
Can offer her a telephone visit this morning (anytime)

## 2020-02-19 NOTE — Telephone Encounter (Signed)
Appointment made for 11:15 televisit

## 2020-02-23 ENCOUNTER — Ambulatory Visit: Payer: Medicare HMO | Admitting: Sports Medicine

## 2020-02-23 ENCOUNTER — Other Ambulatory Visit: Payer: Self-pay

## 2020-02-23 VITALS — BP 138/80 | Ht 63.0 in | Wt 139.0 lb

## 2020-02-23 DIAGNOSIS — S76312A Strain of muscle, fascia and tendon of the posterior muscle group at thigh level, left thigh, initial encounter: Secondary | ICD-10-CM | POA: Diagnosis not present

## 2020-02-24 ENCOUNTER — Encounter: Payer: Self-pay | Admitting: Sports Medicine

## 2020-02-24 NOTE — Progress Notes (Signed)
° °  Subjective:    Patient ID: Danielle Hayes, female    DOB: 28-Dec-1950, 70 y.o.   MRN: [id number]  HPI chief complaint: Left hamstring pain  Very pleasant 70 year old female comes in today complaining of 6 months of posterior left hip pain.  She denies any trauma but rather describes a gradual onset of pain that is worse with activity, specifically with recreational walking, walking downhill, and cross-country skiing.  She had a similar injury in her right hamstring in 2019 and admits that her current pain in the left hamstring area feels similar.  She denies any bruising or swelling.  No groin pain.  She does localize some pain at the ischial tuberosity with sitting as well.  Previous treatment included asking exercises, compression shorts, Nordic exercises, and exercises for her hip abductors and pelvic stabilizers.  Over the course of 2 months her symptoms resolved.  Interim medical history reviewed Medications reviewed Allergies reviewed    Review of Systems As above    Objective:   Physical Exam  Well-developed, well-nourished.  No acute distress  Left hamstring: There is some slight tenderness to palpation at the ischial tuberosity.  Some tenderness to palpation along the proximal semitendinosis.  No ecchymosis.  No swelling.  No palpable defect.  Good strength with resisted knee flexion with the foot both internally and externally rotated.  Examination of the left hip show smooth painless hip range of motion.  Positive Trendelenburg and 4+/5 strength with resisted hip abduction.      Assessment & Plan:   Left hamstring strain versus tendinopathy  We will repeat the same treatment that we utilized for her right hamstring injury in 2019.  We are going to start with some Askling exercises and she will also work on pelvic stabilization and hip abductor strength.  She needs to avoid any sort of exercise that reproduces her pain but she may substitute with an elliptical or  stationary bike.  She will follow up with me again in 4 weeks for reevaluation.  Hopefully we will be able to add Nordic exercises and step ups at that time.  She will call with questions or concerns in the interim.

## 2020-03-04 ENCOUNTER — Other Ambulatory Visit (HOSPITAL_COMMUNITY)
Admission: RE | Admit: 2020-03-04 | Discharge: 2020-03-04 | Disposition: A | Discharge status: Home / Self Care | Payer: Medicare HMO | Source: Ambulatory Visit | Attending: Internal Medicine | Admitting: Internal Medicine

## 2020-03-04 DIAGNOSIS — Z20822 Contact with and (suspected) exposure to covid-19: Secondary | ICD-10-CM | POA: Insufficient documentation

## 2020-03-04 DIAGNOSIS — Z01812 Encounter for preprocedural laboratory examination: Secondary | ICD-10-CM | POA: Diagnosis not present

## 2020-03-04 LAB — SARS CORONAVIRUS 2 (TAT 6-24 HRS): SARS Coronavirus 2: NEGATIVE

## 2020-03-07 ENCOUNTER — Other Ambulatory Visit: Payer: Self-pay

## 2020-03-07 ENCOUNTER — Ambulatory Visit: Payer: Medicare HMO | Admitting: *Deleted

## 2020-03-07 ENCOUNTER — Encounter: Payer: Self-pay | Admitting: Internal Medicine

## 2020-03-07 ENCOUNTER — Ambulatory Visit: Payer: Medicare HMO | Admitting: Internal Medicine

## 2020-03-07 VITALS — BP 126/80 | HR 72 | Temp 97.8°F | Ht 63.0 in | Wt 147.0 lb

## 2020-03-07 DIAGNOSIS — J4520 Mild intermittent asthma, uncomplicated: Secondary | ICD-10-CM

## 2020-03-07 DIAGNOSIS — J301 Allergic rhinitis due to pollen: Secondary | ICD-10-CM | POA: Diagnosis not present

## 2020-03-07 LAB — POCT EXHALED NITRIC OXIDE: FeNO level (ppb): 20

## 2020-03-07 NOTE — Progress Notes (Signed)
Danielle Hayes    [id number]    Oct 12, 1950  Primary Care Physician:Avva, Steva Ready, MD Date of Appointment: 03/07/2020 Established Patient Visit  Chief complaint:   Chief Complaint  Patient presents with  . Follow-up    Get pft results.     HPI: Danielle Hayes is a 70 y.o. woman with mild intermittent asthma and rhinitis.   Interval Updates: Had worsening rhinitis symptoms which seem improved now after having flonase and cetirizine. Here for follow up after PFTs.   She had covid infection in [place] 2020. She did have recurrent bronchitis as a kid.   Current Regimen: albuterol prn Asthma Triggers: mild Exacerbations in the last year: none History of hospitalization or intubation: none Allergy Testing: never had GERD: denies Allergic Rhinitis: yes controlled ACT:  Asthma Control Test ACT Total Score  03/07/2020 23   FeNO: 20 ppb on 3/7  I have reviewed the patient's family social and past medical history and updated as appropriate.   Past Medical History:  Diagnosis Date  . Anxiety   . Arthritis   . Bronchitis   . C. difficile diarrhea 10/2014   states was related to antibiotic-? cefdinir  . Complication of anesthesia    has big time phobia from nausea vomting last time got sick was at 70 years old   . Dysrhythmia    PVC's  . Ear infection 10/2015  . Hyperlipidemia   . Hypertension   . PONV (postoperative nausea and vomiting)     Past Surgical History:  Procedure Laterality Date  . KNEE ARTHROSCOPY     right and left  . KNEE ARTHROSCOPY     right, left  . KNEE CLOSED REDUCTION Left 08/09/2014   Procedure: LEFT KNEE CLOSED MANIPULATION ;  Surgeon: Gaynelle Arabian, MD;  Location: [facility];  Service: Orthopedics;  Laterality: Left;  . LEEP    . TONSILLECTOMY    . TOTAL KNEE ARTHROPLASTY Left 06/28/2014   Procedure: LEFT TOTAL KNEE ARTHROPLASTY;  Surgeon: Gaynelle Arabian, MD;  Location: [facility];  Service: Orthopedics;  Laterality: Left;  . TOTAL  KNEE ARTHROPLASTY Right 10/17/2015   Procedure: RIGHT TOTAL KNEE ARTHROPLASTY;  Surgeon: Gaynelle Arabian, MD;  Location: [facility];  Service: Orthopedics;  Laterality: Right;    Family History  Problem Relation Age of Onset  . Heart disease Mother   . Colon cancer Neg Hx   . Pancreatic cancer Neg Hx   . Rectal cancer Neg Hx   . Stomach cancer Neg Hx     Social History   Occupational History  . Occupation: retired  Tobacco Use  . Smoking status: Never Smoker  . Smokeless tobacco: Never Used  Vaping Use  . Vaping Use: Never used  Substance and Sexual Activity  . Alcohol use: Yes    Alcohol/week: 6.0 standard drinks    Types: 6 Glasses of wine per week    Comment: weekly  . Drug use: No  . Sexual activity: Not on file     Physical Exam: Blood pressure 126/80, pulse 72, temperature 97.8 F (36.6 C), temperature source Temporal, height 5\' 3"  (1.6 m), weight 147 lb (66.7 kg), SpO2 96 %.  Gen: CV: Resp:  Data Reviewed:  PFTs: Spirometry done 3/7 personally reviewed,  shows no airflow limitation   Labs:  Immunization status: Immunization History  Administered Date(s) Administered  . Influenza, High Dose Seasonal PF 10/30/2017, 10/16/2018  . Influenza,inj,Quad PF,6+ Mos 10/02/2019  . PFIZER(Purple Top)SARS-COV-2  Vaccination 04/28/2019, 05/19/2019, 12/09/2019  . Zoster 04/03/2012  . Zoster Recombinat (Shingrix) 12/21/2017, 06/18/2018    Assessment:  Mild intermittent asthma Allergic rhinitis   Plan/Recommendations:  Continue taking flonase, cetirizine for the sore throat. Can use warm salt water gargles.  Continue albuterol as needed or asthma   Return to Care:  6 months.    [name], MD Pulmonary and [place]

## 2020-03-07 NOTE — Patient Instructions (Signed)
The patient should have follow up scheduled with myself in 6 months.   

## 2020-03-07 NOTE — Progress Notes (Signed)
Spiro/Feno performed today.

## 2020-03-09 DIAGNOSIS — Z0189 Encounter for other specified special examinations: Secondary | ICD-10-CM | POA: Diagnosis not present

## 2020-03-09 DIAGNOSIS — L80 Vitiligo: Secondary | ICD-10-CM | POA: Diagnosis not present

## 2020-03-09 DIAGNOSIS — L570 Actinic keratosis: Secondary | ICD-10-CM | POA: Diagnosis not present

## 2020-03-09 DIAGNOSIS — L738 Other specified follicular disorders: Secondary | ICD-10-CM | POA: Diagnosis not present

## 2020-03-09 DIAGNOSIS — L718 Other rosacea: Secondary | ICD-10-CM | POA: Diagnosis not present

## 2020-03-09 DIAGNOSIS — L814 Other melanin hyperpigmentation: Secondary | ICD-10-CM | POA: Diagnosis not present

## 2020-03-09 DIAGNOSIS — L821 Other seborrheic keratosis: Secondary | ICD-10-CM | POA: Diagnosis not present

## 2020-03-09 DIAGNOSIS — D1801 Hemangioma of skin and subcutaneous tissue: Secondary | ICD-10-CM | POA: Diagnosis not present

## 2020-03-22 ENCOUNTER — Other Ambulatory Visit: Payer: Self-pay

## 2020-03-22 ENCOUNTER — Encounter: Payer: Self-pay | Admitting: Sports Medicine

## 2020-03-22 ENCOUNTER — Ambulatory Visit: Payer: Medicare HMO | Admitting: Sports Medicine

## 2020-03-22 VITALS — BP 124/82 | Ht 63.0 in | Wt 139.0 lb

## 2020-03-22 DIAGNOSIS — S76312A Strain of muscle, fascia and tendon of the posterior muscle group at thigh level, left thigh, initial encounter: Secondary | ICD-10-CM | POA: Diagnosis not present

## 2020-03-22 MED ORDER — NITROGLYCERIN 0.2 MG/HR TD PT24: MEDICATED_PATCH | TRANSDERMAL | 1 refills | Status: DC

## 2020-03-22 NOTE — Patient Instructions (Signed)
Thank you for coming in to see [place] today! Please see below to review our plan for today's visit:   1.   Please continue doing the hamstring exercises and you may add in a step up exercises as tolerated. 2.   Please start the nitroglycerin patches protocol as outlined below. 3.   Please plan to return in 6 weeks for reevaluation, sooner if any concerns.  Nitroglycerin Protocol    Apply 1/4 nitroglycerin patch to affected area daily.  Change position of patch within the affected area every 24 hours.  You may experience a headache during the first 1-2 weeks of using the patch, these should subside.  If you experience headaches after beginning nitroglycerin patch treatment, you may take your preferred over the counter pain reliever.  Another side effect of the nitroglycerin patch is skin irritation or rash related to patch adhesive.  Please notify our office if you develop more severe headaches or rash, and stop the patch.  Tendon healing with nitroglycerin patch may require 12 to 24 weeks depending on the extent of injury.  Men should not use if taking Viagra, Cialis, or Levitra.   Do not use if you have migraines or rosacea.     Please call the clinic at [phone] if your symptoms worsen or you have any concerns. It was our pleasure to serve you.       Dr. Dagoberto Ligas [facility]

## 2020-03-22 NOTE — Progress Notes (Signed)
   PCP: Prince Solian, MD  Subjective:   HPI: Patient is a 70 y.o. female avid hiker and cross-country ski year with history of right hamstring injury 2019 here for follow-up on left hamstring strain.  She was seen here on 02/23/2020, at that time was having recurrence of proximal hamstring pain, was diagnosed with hamstring strain versus tendinopathy, started on Askling exercises, hip abductor strength, and pelvic stabilization, along with plans to rest from aggravating activity for now and follow-up today.  Today, patient states that she has had some improvement in her symptoms.  She has been religiously doing the hamstring exercises that were outlined at last visit, and has been working with a Physiological scientist at the gym as well which she feels has been helpful.  She continues to have pain and only recently has been able to go on walks again, but generally feels like she is slowly improving.  She denies any new trauma or injury, numbness or tingling, or new concerns.   Review of Systems:  Per HPI.   [place], medications and smoking status reviewed.      Objective:  Physical Exam:  [place] Adult Exercise 02/23/2020  Frequency of aerobic exercise (# of days/week) 6  Average time in minutes 60  Frequency of strengthening activities (# of days/week) 3     Gen: awake, alert, NAD, comfortable in exam room Pulm: breathing unlabored  Left hamstring: Inspection: No significant abnormalities, no ecchymoses, erythema or edema Palpation: Tender to palpation over the ischial tuberosity, mostly in the medial aspect ROM: Full range of motion of the hip and lumbar spine without pain Strength: 4+/5 strength with resisted hip abduction, equal compared to contralateral side.  Mildly positive Trendelenburg bilaterally, 5/5 strength with resisted knee flexion, with reproduction of pain while the ankle is internally rotated.  Limited ultrasound examination of left proximal  hamstring: -Ischial tuberosity well visualized and does show some cortical irregularity -Proximal hamstring tendon visualized at its insertion on the ischial tuberosity.  There are 2 areas of hypoechogenicity within the tendon without significant increased Doppler flow -Hamstring muscle belly well-visualized and without any severe normality  Impression: -Hypoechogenicity within the proximal hamstring tendon suggestive of chronic proximal hamstring tendinopathy   Assessment & Plan:  1.  Left hamstring tendinopathy Patient with some improvement since last visit.  We discussed that given the chronicity of her symptoms over 6 months, this will likely take several weeks to months to heal.  We did discuss options including PT referral, advancing therapy on her own, and nitroglycerin patches.    Plan: -Start nitroglycerin patches, discussed how to do this, side effects and patient was provided with nitroglycerin protocol -Continue asking exercises and hip strengthening, will advance to step ups and crossover step ups for additional abductor strengthening -Avoid severely aggravating activities -Follow-up in 6 weeks, sooner if needed  Dagoberto Ligas, MD [facility] Fellow 03/22/2020 9:32 AM  Patient seen and evaluated with the sports medicine fellow.  I agree with the above plan of care.  Patient will start nitroglycerin patches utilizing a quarter patch daily.  She will do this in addition to her home exercises.  Follow-up in 6 weeks.  I did discuss the possibility of formal physical therapy if her symptoms are slow to improve.

## 2020-05-03 ENCOUNTER — Other Ambulatory Visit: Payer: Self-pay

## 2020-05-03 ENCOUNTER — Ambulatory Visit: Payer: Medicare HMO | Admitting: Sports Medicine

## 2020-05-03 VITALS — BP 108/80 | Ht 62.0 in | Wt 139.0 lb

## 2020-05-03 DIAGNOSIS — S76312D Strain of muscle, fascia and tendon of the posterior muscle group at thigh level, left thigh, subsequent encounter: Secondary | ICD-10-CM

## 2020-05-04 NOTE — Progress Notes (Addendum)
Patient ID: Danielle Hayes, female   DOB: 02/24/1950, 70 y.o.   MRN: [id number]  Danielle Hayes presents today for follow-up on her left hamstring tendinopathy.  She continues to struggle despite being compliant with a home exercise program.  She is tolerating her nitroglycerin patches.  Physical exam was not repeated today.  At this point, I recommend that she start formal physical therapy.  She has had good success working with Derald Macleod at [facility].  I provided her with a prescription to start physical therapy for exercises and modalities.  Patient will follow up with me in 6 weeks for reevaluation or sooner if she does not notice any immediate improvement with starting formal PT (consider MRI at that point).

## 2020-05-10 DIAGNOSIS — M6281 Muscle weakness (generalized): Secondary | ICD-10-CM | POA: Diagnosis not present

## 2020-05-10 DIAGNOSIS — S76312D Strain of muscle, fascia and tendon of the posterior muscle group at thigh level, left thigh, subsequent encounter: Secondary | ICD-10-CM | POA: Diagnosis not present

## 2020-05-12 DIAGNOSIS — S76312D Strain of muscle, fascia and tendon of the posterior muscle group at thigh level, left thigh, subsequent encounter: Secondary | ICD-10-CM | POA: Diagnosis not present

## 2020-05-12 DIAGNOSIS — M6281 Muscle weakness (generalized): Secondary | ICD-10-CM | POA: Diagnosis not present

## 2020-05-17 DIAGNOSIS — M6281 Muscle weakness (generalized): Secondary | ICD-10-CM | POA: Diagnosis not present

## 2020-05-17 DIAGNOSIS — S76312D Strain of muscle, fascia and tendon of the posterior muscle group at thigh level, left thigh, subsequent encounter: Secondary | ICD-10-CM | POA: Diagnosis not present

## 2020-05-18 DIAGNOSIS — S76312D Strain of muscle, fascia and tendon of the posterior muscle group at thigh level, left thigh, subsequent encounter: Secondary | ICD-10-CM | POA: Diagnosis not present

## 2020-05-18 DIAGNOSIS — M6281 Muscle weakness (generalized): Secondary | ICD-10-CM | POA: Diagnosis not present

## 2020-05-24 DIAGNOSIS — M6281 Muscle weakness (generalized): Secondary | ICD-10-CM | POA: Diagnosis not present

## 2020-05-24 DIAGNOSIS — S76312D Strain of muscle, fascia and tendon of the posterior muscle group at thigh level, left thigh, subsequent encounter: Secondary | ICD-10-CM | POA: Diagnosis not present

## 2020-06-01 DIAGNOSIS — M6281 Muscle weakness (generalized): Secondary | ICD-10-CM | POA: Diagnosis not present

## 2020-06-01 DIAGNOSIS — S76312D Strain of muscle, fascia and tendon of the posterior muscle group at thigh level, left thigh, subsequent encounter: Secondary | ICD-10-CM | POA: Diagnosis not present

## 2020-06-03 DIAGNOSIS — S76312D Strain of muscle, fascia and tendon of the posterior muscle group at thigh level, left thigh, subsequent encounter: Secondary | ICD-10-CM | POA: Diagnosis not present

## 2020-06-03 DIAGNOSIS — M6281 Muscle weakness (generalized): Secondary | ICD-10-CM | POA: Diagnosis not present

## 2020-06-08 DIAGNOSIS — S76312D Strain of muscle, fascia and tendon of the posterior muscle group at thigh level, left thigh, subsequent encounter: Secondary | ICD-10-CM | POA: Diagnosis not present

## 2020-06-08 DIAGNOSIS — M6281 Muscle weakness (generalized): Secondary | ICD-10-CM | POA: Diagnosis not present

## 2020-06-13 DIAGNOSIS — M6281 Muscle weakness (generalized): Secondary | ICD-10-CM | POA: Diagnosis not present

## 2020-06-13 DIAGNOSIS — S76312D Strain of muscle, fascia and tendon of the posterior muscle group at thigh level, left thigh, subsequent encounter: Secondary | ICD-10-CM | POA: Diagnosis not present

## 2020-06-14 ENCOUNTER — Ambulatory Visit: Payer: Medicare HMO | Admitting: Sports Medicine

## 2020-06-14 ENCOUNTER — Other Ambulatory Visit: Payer: Self-pay

## 2020-06-14 VITALS — BP 122/80 | Ht 62.5 in | Wt 139.0 lb

## 2020-06-14 DIAGNOSIS — S76312D Strain of muscle, fascia and tendon of the posterior muscle group at thigh level, left thigh, subsequent encounter: Secondary | ICD-10-CM

## 2020-06-14 MED ORDER — DIAZEPAM 10 MG PO TABS: ORAL_TABLET | ORAL | 0 refills | Status: DC

## 2020-06-15 NOTE — Progress Notes (Signed)
Patient ID: Danielle Hayes, female   DOB: 1950/08/04, 70 y.o.   MRN: [id number]  [name] presents today for follow-up on chronic left hamstring tendinopathy.  She has had 2 months of physical therapy but notes only about 20% improvement.  Although she does continue to localize pain primarily to the proximal hamstring and ischial tuberosity on the left, she is endorsing some pain and cramping further down the hamstring, especially at night.  Physical exam was not repeated today.  We simply talked about work-up and treatment going forward.  Given her slow improvement I would like to get an MRI of her pelvis specifically to evaluate for chronic proximal hamstring tendon tearing on the left.  Phone follow-up with those results when available.  In the meantime, continue with physical therapy and continue with topical nitroglycerin patches until a total of 3 months treatment have passed.

## 2020-06-22 DIAGNOSIS — H2513 Age-related nuclear cataract, bilateral: Secondary | ICD-10-CM | POA: Diagnosis not present

## 2020-06-22 DIAGNOSIS — S76312D Strain of muscle, fascia and tendon of the posterior muscle group at thigh level, left thigh, subsequent encounter: Secondary | ICD-10-CM | POA: Diagnosis not present

## 2020-06-22 DIAGNOSIS — M6281 Muscle weakness (generalized): Secondary | ICD-10-CM | POA: Diagnosis not present

## 2020-06-23 ENCOUNTER — Other Ambulatory Visit: Payer: Medicare HMO

## 2020-06-24 ENCOUNTER — Other Ambulatory Visit: Payer: Self-pay | Admitting: *Deleted

## 2020-06-24 DIAGNOSIS — S76312D Strain of muscle, fascia and tendon of the posterior muscle group at thigh level, left thigh, subsequent encounter: Secondary | ICD-10-CM

## 2020-06-27 ENCOUNTER — Other Ambulatory Visit: Payer: Self-pay

## 2020-06-27 ENCOUNTER — Ambulatory Visit
Admission: RE | Admit: 2020-06-27 | Discharge: 2020-06-27 | Disposition: A | Discharge status: Home / Self Care | Payer: Medicare HMO | Source: Ambulatory Visit | Attending: Sports Medicine | Admitting: Sports Medicine

## 2020-06-27 DIAGNOSIS — M25552 Pain in left hip: Secondary | ICD-10-CM | POA: Diagnosis not present

## 2020-06-27 DIAGNOSIS — S76312D Strain of muscle, fascia and tendon of the posterior muscle group at thigh level, left thigh, subsequent encounter: Secondary | ICD-10-CM

## 2020-06-27 DIAGNOSIS — M79652 Pain in left thigh: Secondary | ICD-10-CM | POA: Diagnosis not present

## 2020-06-28 DIAGNOSIS — S76312D Strain of muscle, fascia and tendon of the posterior muscle group at thigh level, left thigh, subsequent encounter: Secondary | ICD-10-CM | POA: Diagnosis not present

## 2020-06-28 DIAGNOSIS — M6281 Muscle weakness (generalized): Secondary | ICD-10-CM | POA: Diagnosis not present

## 2020-06-29 ENCOUNTER — Other Ambulatory Visit: Payer: Medicare HMO

## 2020-07-06 DIAGNOSIS — M6281 Muscle weakness (generalized): Secondary | ICD-10-CM | POA: Diagnosis not present

## 2020-07-06 DIAGNOSIS — S76312D Strain of muscle, fascia and tendon of the posterior muscle group at thigh level, left thigh, subsequent encounter: Secondary | ICD-10-CM | POA: Diagnosis not present

## 2020-07-07 ENCOUNTER — Ambulatory Visit
Admission: RE | Admit: 2020-07-07 | Discharge: 2020-07-07 | Disposition: A | Discharge status: Home / Self Care | Payer: Medicare HMO | Source: Ambulatory Visit | Attending: Sports Medicine | Admitting: Sports Medicine

## 2020-07-07 ENCOUNTER — Other Ambulatory Visit: Payer: Self-pay

## 2020-07-07 DIAGNOSIS — S76312D Strain of muscle, fascia and tendon of the posterior muscle group at thigh level, left thigh, subsequent encounter: Secondary | ICD-10-CM

## 2020-07-07 DIAGNOSIS — S76311A Strain of muscle, fascia and tendon of the posterior muscle group at thigh level, right thigh, initial encounter: Secondary | ICD-10-CM | POA: Diagnosis not present

## 2020-07-07 DIAGNOSIS — S76312A Strain of muscle, fascia and tendon of the posterior muscle group at thigh level, left thigh, initial encounter: Secondary | ICD-10-CM | POA: Diagnosis not present

## 2020-07-07 DIAGNOSIS — R102 Pelvic and perineal pain: Secondary | ICD-10-CM | POA: Diagnosis not present

## 2020-07-08 DIAGNOSIS — Z0100 Encounter for examination of eyes and vision without abnormal findings: Secondary | ICD-10-CM | POA: Diagnosis not present

## 2020-07-13 ENCOUNTER — Telehealth: Payer: Self-pay | Admitting: Sports Medicine

## 2020-07-13 NOTE — Telephone Encounter (Signed)
I spoke with [place] on the phone today after reviewing MRI findings of her pelvis.  MRI does confirm some mild tendinosis of the bilateral hamstring tendon origins with some subtle low-grade partial tearing.  I have advised her to continue with her physical therapy weaning to a home exercise program as tolerated.  She uses ibuprofen occasionally as needed for pain.  I think she is safe to continue with activity using pain as her guide and follow-up as needed.

## 2020-08-10 DIAGNOSIS — H60332 Swimmer's ear, left ear: Secondary | ICD-10-CM | POA: Diagnosis not present

## 2020-09-16 NOTE — Progress Notes (Signed)
PFT ordered but provider only wanted spirometry. Basic spirometry ordered and performed.

## 2020-09-20 DIAGNOSIS — R140 Abdominal distension (gaseous): Secondary | ICD-10-CM | POA: Diagnosis not present

## 2020-09-20 DIAGNOSIS — N952 Postmenopausal atrophic vaginitis: Secondary | ICD-10-CM | POA: Diagnosis not present

## 2020-09-20 DIAGNOSIS — Z780 Asymptomatic menopausal state: Secondary | ICD-10-CM | POA: Diagnosis not present

## 2020-09-20 DIAGNOSIS — Z01419 Encounter for gynecological examination (general) (routine) without abnormal findings: Secondary | ICD-10-CM | POA: Diagnosis not present

## 2020-09-20 DIAGNOSIS — Z01411 Encounter for gynecological examination (general) (routine) with abnormal findings: Secondary | ICD-10-CM | POA: Diagnosis not present

## 2020-10-13 DIAGNOSIS — Z96653 Presence of artificial knee joint, bilateral: Secondary | ICD-10-CM | POA: Diagnosis not present

## 2020-10-14 DIAGNOSIS — I10 Essential (primary) hypertension: Secondary | ICD-10-CM | POA: Diagnosis not present

## 2020-10-14 DIAGNOSIS — E785 Hyperlipidemia, unspecified: Secondary | ICD-10-CM | POA: Diagnosis not present

## 2020-10-21 DIAGNOSIS — K219 Gastro-esophageal reflux disease without esophagitis: Secondary | ICD-10-CM | POA: Diagnosis not present

## 2020-10-21 DIAGNOSIS — L80 Vitiligo: Secondary | ICD-10-CM | POA: Diagnosis not present

## 2020-10-21 DIAGNOSIS — E785 Hyperlipidemia, unspecified: Secondary | ICD-10-CM | POA: Diagnosis not present

## 2020-10-21 DIAGNOSIS — M170 Bilateral primary osteoarthritis of knee: Secondary | ICD-10-CM | POA: Diagnosis not present

## 2020-10-21 DIAGNOSIS — I10 Essential (primary) hypertension: Secondary | ICD-10-CM | POA: Diagnosis not present

## 2020-10-21 DIAGNOSIS — R82998 Other abnormal findings in urine: Secondary | ICD-10-CM | POA: Diagnosis not present

## 2020-10-21 DIAGNOSIS — Z0000 Encounter for general adult medical examination without abnormal findings: Secondary | ICD-10-CM | POA: Diagnosis not present

## 2020-10-21 DIAGNOSIS — J45909 Unspecified asthma, uncomplicated: Secondary | ICD-10-CM | POA: Diagnosis not present

## 2020-10-21 DIAGNOSIS — R197 Diarrhea, unspecified: Secondary | ICD-10-CM | POA: Diagnosis not present

## 2020-10-26 DIAGNOSIS — Z1212 Encounter for screening for malignant neoplasm of rectum: Secondary | ICD-10-CM | POA: Diagnosis not present

## 2020-11-08 ENCOUNTER — Other Ambulatory Visit: Payer: Self-pay | Admitting: Internal Medicine

## 2020-11-08 DIAGNOSIS — Z1231 Encounter for screening mammogram for malignant neoplasm of breast: Secondary | ICD-10-CM

## 2020-11-09 DIAGNOSIS — Z23 Encounter for immunization: Secondary | ICD-10-CM | POA: Diagnosis not present

## 2020-11-22 DIAGNOSIS — R42 Dizziness and giddiness: Secondary | ICD-10-CM | POA: Diagnosis not present

## 2020-11-22 DIAGNOSIS — H93293 Other abnormal auditory perceptions, bilateral: Secondary | ICD-10-CM | POA: Diagnosis not present

## 2021-02-02 ENCOUNTER — Ambulatory Visit
Admission: RE | Admit: 2021-02-02 | Discharge: 2021-02-02 | Disposition: A | Discharge status: Home / Self Care | Payer: Medicare HMO | Source: Ambulatory Visit | Attending: Internal Medicine | Admitting: Internal Medicine

## 2021-02-02 DIAGNOSIS — Z1231 Encounter for screening mammogram for malignant neoplasm of breast: Secondary | ICD-10-CM

## 2021-02-15 DIAGNOSIS — D1801 Hemangioma of skin and subcutaneous tissue: Secondary | ICD-10-CM | POA: Diagnosis not present

## 2021-02-15 DIAGNOSIS — L570 Actinic keratosis: Secondary | ICD-10-CM | POA: Diagnosis not present

## 2021-02-15 DIAGNOSIS — L718 Other rosacea: Secondary | ICD-10-CM | POA: Diagnosis not present

## 2021-02-15 DIAGNOSIS — L814 Other melanin hyperpigmentation: Secondary | ICD-10-CM | POA: Diagnosis not present

## 2021-02-15 DIAGNOSIS — I788 Other diseases of capillaries: Secondary | ICD-10-CM | POA: Diagnosis not present

## 2021-02-15 DIAGNOSIS — L821 Other seborrheic keratosis: Secondary | ICD-10-CM | POA: Diagnosis not present

## 2021-02-15 DIAGNOSIS — L80 Vitiligo: Secondary | ICD-10-CM | POA: Diagnosis not present

## 2021-03-27 DIAGNOSIS — K029 Dental caries, unspecified: Secondary | ICD-10-CM | POA: Diagnosis not present

## 2021-04-03 DIAGNOSIS — K1329 Other disturbances of oral epithelium, including tongue: Secondary | ICD-10-CM | POA: Diagnosis not present

## 2021-04-26 IMAGING — DX DG CHEST 1V PORT
1 series · 1 of 1 positions shown · non-contrast
Comparison: None.

CLINICAL DATA: Chest pain

EXAM:
PORTABLE CHEST 1 VIEW

[chest ap]
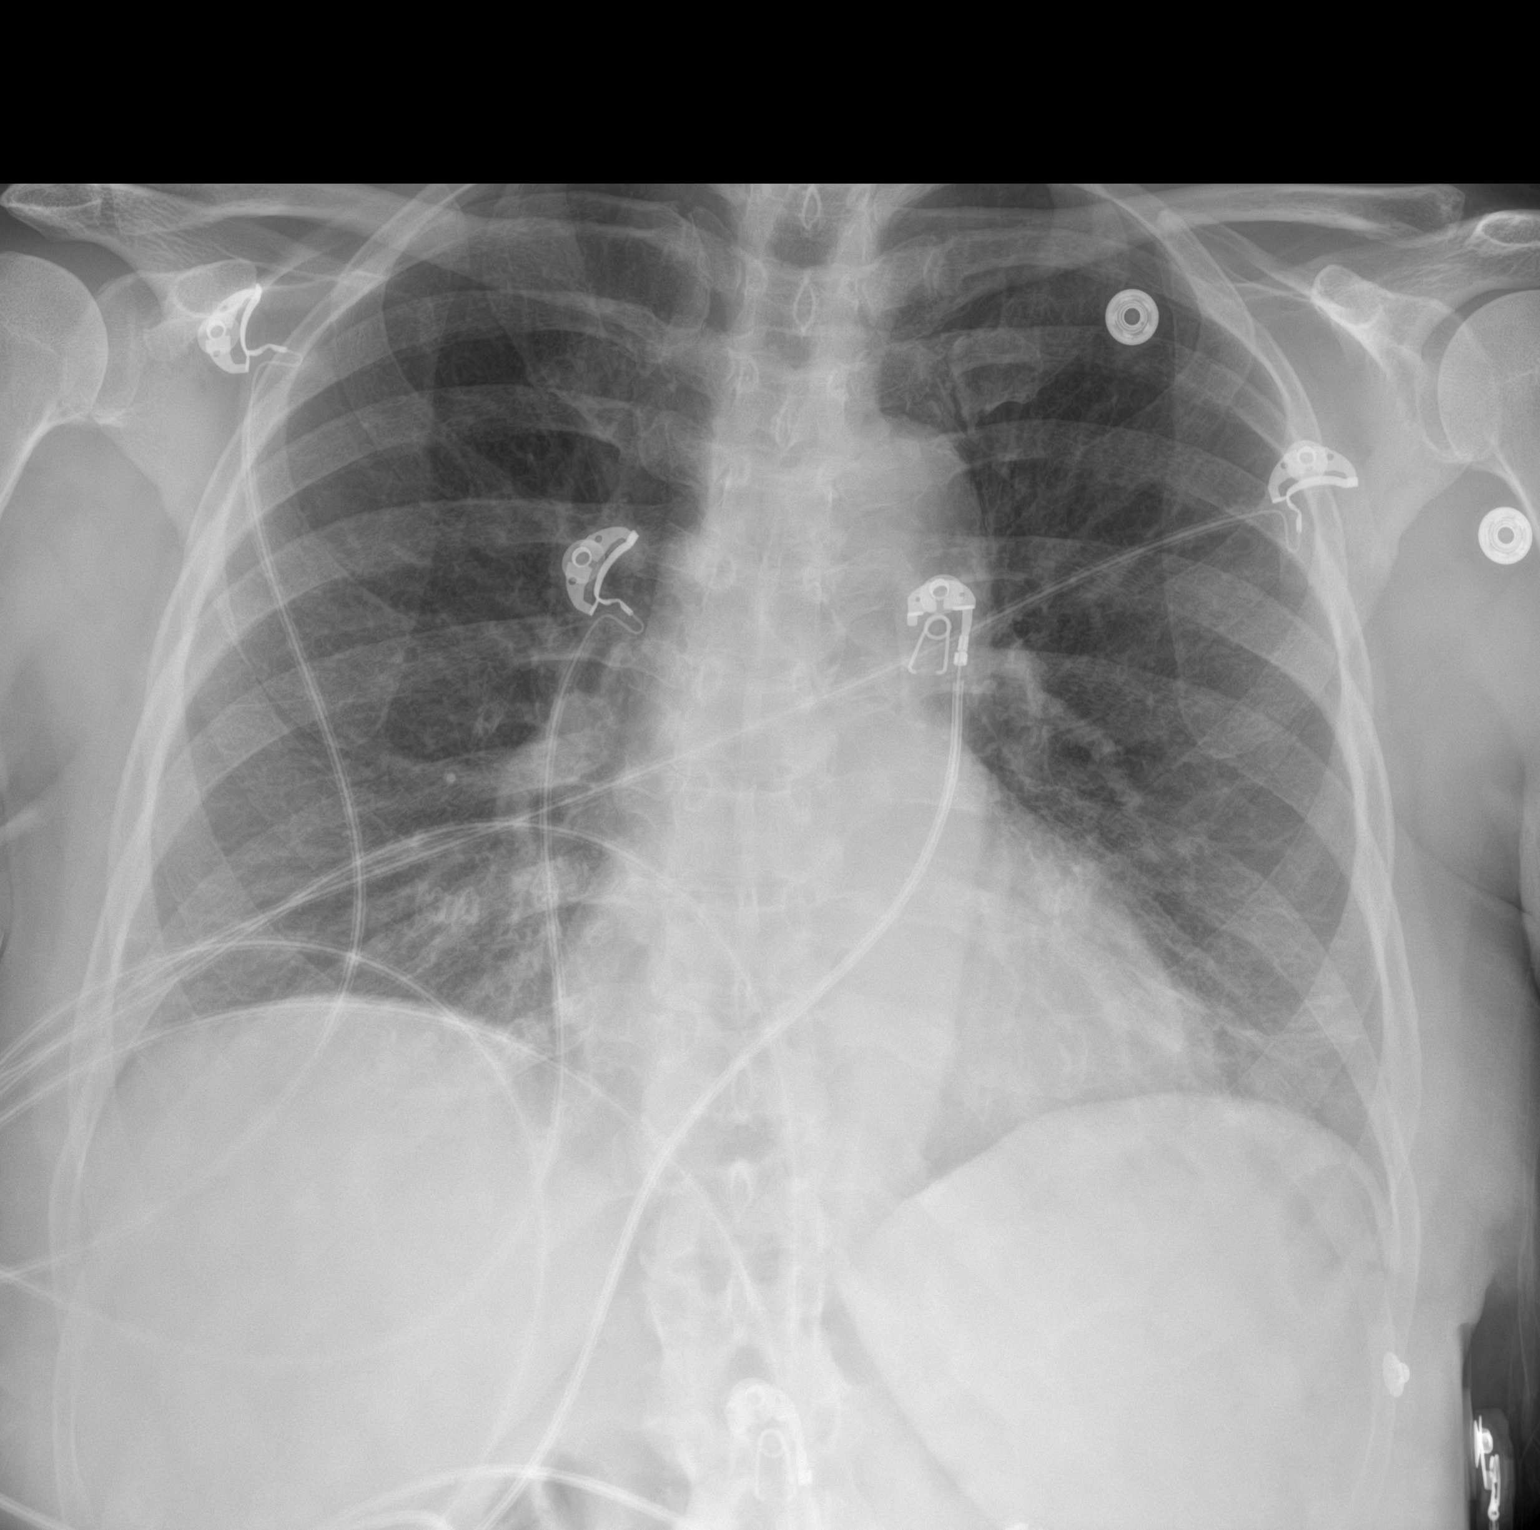

[1 of 1 positions shown; findings below may reference images not displayed]

FINDINGS: Cardiac shadow is within normal limits. Mild atelectatic changes are
noted in the left lung base. No acute bony abnormality is noted.
IMPRESSION: Mild left basilar atelectasis.

## 2021-06-14 DIAGNOSIS — H5213 Myopia, bilateral: Secondary | ICD-10-CM | POA: Diagnosis not present

## 2021-06-28 DIAGNOSIS — Z0100 Encounter for examination of eyes and vision without abnormal findings: Secondary | ICD-10-CM | POA: Diagnosis not present

## 2021-07-30 DIAGNOSIS — R531 Weakness: Secondary | ICD-10-CM | POA: Diagnosis not present

## 2021-07-30 DIAGNOSIS — I10 Essential (primary) hypertension: Secondary | ICD-10-CM | POA: Diagnosis not present

## 2021-07-30 DIAGNOSIS — R42 Dizziness and giddiness: Secondary | ICD-10-CM | POA: Diagnosis not present

## 2021-07-31 DIAGNOSIS — I493 Ventricular premature depolarization: Secondary | ICD-10-CM | POA: Diagnosis not present

## 2021-07-31 DIAGNOSIS — R55 Syncope and collapse: Secondary | ICD-10-CM | POA: Diagnosis not present

## 2021-07-31 DIAGNOSIS — I10 Essential (primary) hypertension: Secondary | ICD-10-CM | POA: Diagnosis not present

## 2021-07-31 DIAGNOSIS — E785 Hyperlipidemia, unspecified: Secondary | ICD-10-CM | POA: Diagnosis not present

## 2021-08-07 ENCOUNTER — Encounter: Payer: Self-pay | Admitting: Internal Medicine

## 2021-08-07 ENCOUNTER — Ambulatory Visit: Payer: Medicare HMO | Admitting: Internal Medicine

## 2021-08-07 VITALS — BP 166/95 | HR 72 | Temp 97.3°F | Resp 16 | Ht 62.0 in | Wt 145.0 lb

## 2021-08-07 DIAGNOSIS — R55 Syncope and collapse: Secondary | ICD-10-CM

## 2021-08-07 DIAGNOSIS — R0789 Other chest pain: Secondary | ICD-10-CM | POA: Diagnosis not present

## 2021-08-07 DIAGNOSIS — R002 Palpitations: Secondary | ICD-10-CM | POA: Diagnosis not present

## 2021-08-07 DIAGNOSIS — I10 Essential (primary) hypertension: Secondary | ICD-10-CM

## 2021-08-07 DIAGNOSIS — I493 Ventricular premature depolarization: Secondary | ICD-10-CM | POA: Insufficient documentation

## 2021-08-07 MED ORDER — AMLODIPINE BESYLATE 5 MG PO TABS: 5.0000 mg | ORAL_TABLET | Freq: Every morning | ORAL | 1 refills | Status: DC

## 2021-08-07 NOTE — Patient Instructions (Addendum)
Echo ordered Event monitor ordered Schedule nuclear treadmill stress Take entire pill of Maxzide Double amlodipine to '5mg'$  qHS Track pressures for 1 week and call office Follow up in 6 weeks

## 2021-08-07 NOTE — Progress Notes (Signed)
Primary Physician/Referring:  Prince Solian, MD  Patient ID: Danielle Hayes, female    DOB: [birth year], 71 y.o.   MRN: [id number]  Chief Complaint  Patient presents with   Near Syncope   New Patient (Initial Visit)   HPI:    Danielle Hayes  is a 71 y.o. F with past medical history significant for PVCs that did not require intervention, near-syncope, and HTN. She recently had a near syncopal event at church and she felt her heart racing as well. She has noticed this has happened when she is exercising. She does have a family history of Afib but has not been diagnosed personally ever in the past. She is going on vacation and will be doing a lot of hiking, patient agreeable to stress test prior to going on vacation as she will be very active. She has white coat hypertension and states her BP is controlled on these medications at home. Patient will keep BP log.   Past Medical History:  Diagnosis Date   Anxiety    Arthritis    Bronchitis    C. difficile diarrhea 10/2014   states was related to antibiotic-? cefdinir   Complication of anesthesia    has big time phobia from nausea vomting last time got sick was at 71 years old    Dysrhythmia    PVC's   Ear infection 10/2015   Hyperlipidemia    Hypertension    PONV (postoperative nausea and vomiting)    Past Surgical History:  Procedure Laterality Date   KNEE ARTHROSCOPY     right and left   KNEE ARTHROSCOPY     right, left   KNEE CLOSED REDUCTION Left 08/09/2014   Procedure: LEFT KNEE CLOSED MANIPULATION ;  Surgeon: Gaynelle Arabian, MD;  Location: [facility];  Service: Orthopedics;  Laterality: Left;   LEEP     TONSILLECTOMY     TOTAL KNEE ARTHROPLASTY Left 06/28/2014   Procedure: LEFT TOTAL KNEE ARTHROPLASTY;  Surgeon: Gaynelle Arabian, MD;  Location: [facility];  Service: Orthopedics;  Laterality: Left;   TOTAL KNEE ARTHROPLASTY Right 10/17/2015   Procedure: RIGHT TOTAL KNEE ARTHROPLASTY;  Surgeon: Gaynelle Arabian, MD;  Location:  [facility];  Service: Orthopedics;  Laterality: Right;   Family History  Problem Relation Age of Onset   Heart disease Mother    Alzheimer's disease Father    Breast cancer Maternal Aunt    Colon cancer Neg Hx    Pancreatic cancer Neg Hx    Rectal cancer Neg Hx    Stomach cancer Neg Hx     Social History   Tobacco Use   Smoking status: Never   Smokeless tobacco: Never  Substance Use Topics   Alcohol use: Yes    Alcohol/week: 6.0 standard drinks of alcohol    Types: 6 Glasses of wine per week    Comment: weekly   Marital Status: Married  ROS  Review of Systems  Constitutional: Negative.  Cardiovascular:  Positive for irregular heartbeat, near-syncope and palpitations. Negative for claudication, cyanosis and syncope.  Respiratory: Negative.    Gastrointestinal: Negative.   Neurological: Negative.   All other systems reviewed and are negative.  Objective  Blood pressure (!) 166/95, pulse 72, temperature (!) 97.3 F (36.3 C), temperature source Temporal, resp. rate 16, height '5\' 2"'$  (1.575 m), weight 145 lb (65.8 kg), SpO2 98 %. Body mass index is 26.52 kg/m.     08/07/2021    1:53 PM 06/14/2020   11:52 AM 05/03/2020  1:56 PM  Vitals with BMI  Height '5\' 2"'$  5' 2.5" '5\' 2"'$   Weight 145 lbs 139 lbs 139 lbs  BMI 08.14 25 48.18  Systolic 563 149 702  Diastolic 95 80 80  Pulse 72       Physical Exam Vitals (repeat BP by me 136/80) reviewed.  Constitutional:      General: She is not in acute distress.    Appearance: Normal appearance.  HENT:     Head: Normocephalic and atraumatic.  Eyes:     Extraocular Movements: Extraocular movements intact.  Neck:     Vascular: No carotid bruit.  Cardiovascular:     Rate and Rhythm: Normal rate and regular rhythm.     Pulses: Normal pulses.     Heart sounds: Normal heart sounds. No murmur heard.    No friction rub. No gallop.  Pulmonary:     Effort: Pulmonary effort is normal.     Breath sounds: Normal breath sounds.  Abdominal:      General: Abdomen is flat. Bowel sounds are normal.     Palpations: Abdomen is soft.  Musculoskeletal:     Right lower leg: No edema.     Left lower leg: No edema.  Skin:    General: Skin is warm and dry.  Neurological:     General: No focal deficit present.     Mental Status: She is alert.  Psychiatric:        Mood and Affect: Mood normal.    Medications and allergies   Allergies  Allergen Reactions   Cefdinir Other (See Comments)    c diff   Chlorhexidine Itching    Itchy and rash   Hydrocodone Nausea Only   Oxycodone Other (See Comments)    hallucinations   Synvisc [Hylan G-F 20] Swelling    Red, could not walk , rooster injections into knee   Xarelto [Rivaroxaban] Hives     Medication list after today's encounter   Current Outpatient Medications:    albuterol (VENTOLIN HFA) 108 (90 Base) MCG/ACT inhaler, Inhale 1-2 puffs into the lungs every 4 (four) hours as needed for wheezing or shortness of breath., Disp: , Rfl:    atorvastatin (LIPITOR) 40 MG tablet, Take 40 mg by mouth every morning. , Disp: , Rfl:    buPROPion (WELLBUTRIN XL) 150 MG 24 hr tablet, Take 150 mg by mouth daily., Disp: , Rfl:    cholecalciferol (VITAMIN D3) 25 MCG (1000 UNIT) tablet, Take 2,000 Units by mouth daily., Disp: , Rfl:    estradiol (ESTRACE) 0.1 MG/GM vaginal cream, Place 1 Applicatorful vaginally 2 (two) times a week., Disp: , Rfl:    Glucosamine HCl 1000 MG TABS, Take 1,000 mg by mouth in the morning and at bedtime., Disp: , Rfl:    ibuprofen (ADVIL) 200 MG tablet, Take 400 mg by mouth every 6 (six) hours as needed for headache or moderate pain., Disp: , Rfl:    Multiple Vitamin (MULTI-VITAMIN DAILY PO), Multi Vitamin, Disp: , Rfl:    Omega 3 1200 MG CAPS, Take 1,200 mg by mouth daily., Disp: , Rfl:    OVER THE COUNTER MEDICATION, Take 2-3 mLs by mouth daily. Psyllium Husk Powder (soluble fiber) 2-3 tsp, Disp: , Rfl:    OVER THE COUNTER MEDICATION, Take 1 tablet by mouth daily. Suprema  dophilus (probiotic), Disp: , Rfl:    temazepam (RESTORIL) 15 MG capsule, Take 15 mg by mouth at bedtime as needed for sleep., Disp: , Rfl:  triamterene-hydrochlorothiazide (MAXZIDE-25) 37.5-25 MG per tablet, Take 0.5 tablets by mouth every morning. , Disp: , Rfl:    amLODipine (NORVASC) 5 MG tablet, Take 1 tablet (5 mg total) by mouth every morning., Disp: 90 tablet, Rfl: 1  Laboratory examination:   Lab Results  Component Value Date   NA 140 02/04/2020   K 3.5 02/04/2020   CO2 26 02/04/2020   GLUCOSE 113 (H) 02/04/2020   BUN 13 02/04/2020   CREATININE 0.76 02/04/2020   CALCIUM 8.7 (L) 02/04/2020   GFRNONAA >60 02/04/2020       Latest Ref Rng & Units 02/04/2020   10:20 PM 07/02/2019    4:00 AM 07/01/2019   10:40 AM  CMP  Glucose 70 - 99 mg/dL 113  99  115   BUN 8 - 23 mg/dL '13  15  17   '$ Creatinine 0.44 - 1.00 mg/dL 0.76  0.50  0.63   Sodium 135 - 145 mmol/L 140  139  139   Potassium 3.5 - 5.1 mmol/L 3.5  3.5  3.8   Chloride 98 - 111 mmol/L 103  104  102   CO2 22 - 32 mmol/L '26  26  28   '$ Calcium 8.9 - 10.3 mg/dL 8.7  8.2  8.9       Latest Ref Rng & Units 02/04/2020   10:20 PM 07/02/2019    4:00 AM 07/01/2019    5:32 PM  CBC  WBC 4.0 - 10.5 K/uL 5.8  7.2  5.4   Hemoglobin 12.0 - 15.0 g/dL 15.1  15.0  14.6   Hematocrit 36.0 - 46.0 % 42.8  42.7  42.0   Platelets 150 - 400 K/uL 236  173  203     Lipid Panel No results for input(s): "CHOL", "TRIG", "LDLCALC", "VLDL", "HDL", "CHOLHDL", "LDLDIRECT" in the last 8760 hours.  HEMOGLOBIN A1C No results found for: "HGBA1C", "MPG" TSH No results for input(s): "TSH" in the last 8760 hours.  External labs:     Radiology:    Cardiac Studies:   No results found for this or any previous visit from the past 1095 days.     No results found for this or any previous visit from the past 1095 days.     EKG:   08/07/2021: NSR,  normal EKG  Assessment     ICD-10-CM   1. Near syncope  R55 EKG 12-Lead    [facility] ECHOCARDIOGRAM  COMPLETE    LONG TERM MONITOR (3-14 DAYS)    2. Other chest pain  R07.89 PCV ECHOCARDIOGRAM COMPLETE    LONG TERM MONITOR (3-14 DAYS)    [facility] MYOCARDIAL PERFUSION WO LEXISCAN    3. Primary hypertension  I10 amLODipine (NORVASC) 5 MG tablet    4. Palpitations  R00.2     5. PVCs (premature ventricular contractions)  I49.3        Orders Placed This Encounter  Procedures   LONG TERM MONITOR (3-14 DAYS)    Standing Status:   Future    Number of Occurrences:   1    Standing Expiration Date:   08/08/2022    Order Specific Question:   Where should this test be performed?    Answer:   [facility]    Order Specific Question:   Does the patient have an implanted cardiac device?    Answer:   No    Order Specific Question:   Prescribed days of wear    Answer:   14    Order Specific Question:  Type of enrollment    Answer:   Clinic Enrollment    Order Specific Question:   Release to patient    Answer:   Immediate   PCV MYOCARDIAL PERFUSION WO LEXISCAN    Standing Status:   Future    Standing Expiration Date:   10/07/2021   EKG 12-Lead   [facility] ECHOCARDIOGRAM COMPLETE    Standing Status:   Future    Standing Expiration Date:   08/08/2022    Meds ordered this encounter  Medications   amLODipine (NORVASC) 5 MG tablet    Sig: Take 1 tablet (5 mg total) by mouth every morning.    Dispense:  90 tablet    Refill:  1    Medications Discontinued During This Encounter  Medication Reason   aspirin 325 MG tablet    BLACK COHOSH EXTRACT PO    diazepam (VALIUM) 10 MG tablet    estradiol (VIVELLE-DOT) 0.0375 MG/24HR    nitroGLYCERIN (NITRODUR - DOSED IN MG/24 HR) 0.2 mg/hr patch    progesterone (PROMETRIUM) 200 MG capsule    amLODipine (NORVASC) 2.5 MG tablet Reorder     Recommendations:   Danielle Hayes is a 71 y.o.  F with HTN, near-syncope, and palpitations  Echo ordered Event monitor ordered Schedule stress test prior to vacation Take entire pill of Maxzide Double  amlodipine to '5mg'$  take at nighttime Track pressures for 1 week and call office Follow up in 6 weeks    Floydene Flock, DO  08/07/2021, 2:31 PM Office: [phone] Pager: [phone]

## 2021-08-08 ENCOUNTER — Encounter: Payer: Self-pay | Admitting: Internal Medicine

## 2021-08-08 NOTE — Telephone Encounter (Signed)
From pt

## 2021-08-10 ENCOUNTER — Encounter: Payer: Self-pay | Admitting: Internal Medicine

## 2021-08-10 NOTE — Telephone Encounter (Signed)
Follow up with patient.

## 2021-08-14 ENCOUNTER — Other Ambulatory Visit: Payer: Medicare HMO

## 2021-08-16 ENCOUNTER — Ambulatory Visit: Payer: Medicare HMO

## 2021-08-16 DIAGNOSIS — R0789 Other chest pain: Secondary | ICD-10-CM | POA: Diagnosis not present

## 2021-08-17 DIAGNOSIS — R197 Diarrhea, unspecified: Secondary | ICD-10-CM | POA: Diagnosis not present

## 2021-08-17 NOTE — Progress Notes (Signed)
Called and spoke with patient regarding her stress test results.

## 2021-08-17 NOTE — Progress Notes (Signed)
Can you please call pt and let them know test is normal. Thank you!

## 2021-08-28 ENCOUNTER — Encounter: Payer: Self-pay | Admitting: Internal Medicine

## 2021-08-28 ENCOUNTER — Other Ambulatory Visit: Payer: Self-pay | Admitting: Internal Medicine

## 2021-08-28 ENCOUNTER — Other Ambulatory Visit: Payer: Medicare HMO

## 2021-08-28 MED ORDER — APIXABAN 5 MG PO TABS: 5.0000 mg | ORAL_TABLET | Freq: Two times a day (BID) | ORAL | 2 refills | Status: DC

## 2021-08-28 MED ORDER — METOPROLOL SUCCINATE ER 25 MG PO TB24: 25.0000 mg | ORAL_TABLET | Freq: Every day | ORAL | 2 refills | Status: DC

## 2021-08-28 NOTE — Telephone Encounter (Signed)
From patient.

## 2021-08-28 NOTE — Telephone Encounter (Signed)
Can we get her in sooner?

## 2021-08-29 NOTE — Telephone Encounter (Signed)
From patient.

## 2021-08-30 ENCOUNTER — Encounter: Payer: Self-pay | Admitting: Internal Medicine

## 2021-08-30 ENCOUNTER — Inpatient Hospital Stay: Payer: Medicare HMO

## 2021-08-30 ENCOUNTER — Ambulatory Visit: Payer: Medicare HMO | Admitting: Internal Medicine

## 2021-08-30 ENCOUNTER — Ambulatory Visit: Payer: Medicare HMO | Admitting: Cardiology

## 2021-08-30 VITALS — BP 151/84 | HR 76 | Temp 97.6°F | Ht 62.0 in | Wt 144.0 lb

## 2021-08-30 DIAGNOSIS — R55 Syncope and collapse: Secondary | ICD-10-CM | POA: Diagnosis not present

## 2021-08-30 DIAGNOSIS — I480 Paroxysmal atrial fibrillation: Secondary | ICD-10-CM | POA: Diagnosis not present

## 2021-08-30 DIAGNOSIS — R002 Palpitations: Secondary | ICD-10-CM

## 2021-08-30 DIAGNOSIS — I10 Essential (primary) hypertension: Secondary | ICD-10-CM

## 2021-08-30 DIAGNOSIS — R0789 Other chest pain: Secondary | ICD-10-CM | POA: Diagnosis not present

## 2021-08-30 NOTE — Progress Notes (Signed)
Primary Physician/Referring:  Prince Solian, MD  Patient ID: Danielle Hayes, female    DOB: [birth year], 71 y.o.   MRN: [id number]  No chief complaint on file.  HPI:    Danielle Hayes  is a 71 y.o. F with past medical history significant for PVCs that did not require intervention, near-syncope, and HTN. She does have a family history of Afib and she her apple watch recently recorded multiple episodes of Afib. Today she is in normal sinus rhythm. However, the rhythm strips from her apple watch are clearly Afib. Patient is here for event monitor and to discuss new medications for Afib.  Past Medical History:  Diagnosis Date   Anxiety    Arthritis    Bronchitis    C. difficile diarrhea 10/2014   states was related to antibiotic-? cefdinir   Complication of anesthesia    has big time phobia from nausea vomting last time got sick was at 71 years old    Dysrhythmia    PVC's   Ear infection 10/2015   Hyperlipidemia    Hypertension    PONV (postoperative nausea and vomiting)    Past Surgical History:  Procedure Laterality Date   KNEE ARTHROSCOPY     right and left   KNEE ARTHROSCOPY     right, left   KNEE CLOSED REDUCTION Left 08/09/2014   Procedure: LEFT KNEE CLOSED MANIPULATION ;  Surgeon: Gaynelle Arabian, MD;  Location: [facility];  Service: Orthopedics;  Laterality: Left;   LEEP     TONSILLECTOMY     TOTAL KNEE ARTHROPLASTY Left 06/28/2014   Procedure: LEFT TOTAL KNEE ARTHROPLASTY;  Surgeon: Gaynelle Arabian, MD;  Location: [facility];  Service: Orthopedics;  Laterality: Left;   TOTAL KNEE ARTHROPLASTY Right 10/17/2015   Procedure: RIGHT TOTAL KNEE ARTHROPLASTY;  Surgeon: Gaynelle Arabian, MD;  Location: [facility];  Service: Orthopedics;  Laterality: Right;   Family History  Problem Relation Age of Onset   Heart disease Mother    Alzheimer's disease Father    Breast cancer Maternal Aunt    Colon cancer Neg Hx    Pancreatic cancer Neg Hx    Rectal cancer Neg Hx    Stomach cancer  Neg Hx     Social History   Tobacco Use   Smoking status: Never   Smokeless tobacco: Never  Substance Use Topics   Alcohol use: Yes    Alcohol/week: 6.0 standard drinks of alcohol    Types: 6 Glasses of wine per week    Comment: weekly   Marital Status: Married  ROS  Review of Systems  Constitutional: Negative.  Cardiovascular:  Positive for irregular heartbeat and palpitations. Negative for claudication, cyanosis, near-syncope and syncope.  Respiratory: Negative.    Gastrointestinal: Negative.   Neurological: Negative.   All other systems reviewed and are negative.  Objective  Blood pressure (!) 151/84, pulse 76, temperature 97.6 F (36.4 C), temperature source Temporal, height '5\' 2"'$  (1.575 m), weight 144 lb (65.3 kg), SpO2 99 %. Body mass index is 26.34 kg/m.     08/30/2021   10:56 AM 08/07/2021    1:53 PM 06/14/2020   11:52 AM  Vitals with BMI  Height '5\' 2"'$  '5\' 2"'$  5' 2.5"  Weight 144 lbs 145 lbs 139 lbs  BMI 32.35 57.32 25  Systolic 202 542 706  Diastolic 84 95 80  Pulse 76 72      Physical Exam Vitals (repeat BP by me 136/80) and nursing note reviewed.  Constitutional:  General: She is not in acute distress.    Appearance: Normal appearance.  HENT:     Head: Normocephalic and atraumatic.  Neck:     Vascular: No carotid bruit.  Cardiovascular:     Rate and Rhythm: Normal rate and regular rhythm.     Pulses: Normal pulses.     Heart sounds: Normal heart sounds. No murmur heard.    No friction rub. No gallop.  Pulmonary:     Effort: Pulmonary effort is normal.     Breath sounds: Normal breath sounds.  Abdominal:     General: Abdomen is flat. Bowel sounds are normal.     Palpations: Abdomen is soft.  Musculoskeletal:     Right lower leg: No edema.     Left lower leg: No edema.  Skin:    General: Skin is warm and dry.  Neurological:     Mental Status: She is alert.    Medications and allergies   Allergies  Allergen Reactions   Cefdinir Other (See  Comments)    c diff   Chlorhexidine Itching    Itchy and rash   Hydrocodone Nausea Only   Oxycodone Other (See Comments)    hallucinations   Synvisc [Hylan G-F 20] Swelling    Red, could not walk , rooster injections into knee   Xarelto [Rivaroxaban] Hives     Medication list after today's encounter   Current Outpatient Medications:    albuterol (VENTOLIN HFA) 108 (90 Base) MCG/ACT inhaler, Inhale 1-2 puffs into the lungs every 4 (four) hours as needed for wheezing or shortness of breath., Disp: , Rfl:    apixaban (ELIQUIS) 5 MG TABS tablet, Take 1 tablet (5 mg total) by mouth 2 (two) times daily., Disp: 60 tablet, Rfl: 2   atorvastatin (LIPITOR) 40 MG tablet, Take 40 mg by mouth every morning. , Disp: , Rfl:    buPROPion (WELLBUTRIN XL) 150 MG 24 hr tablet, Take 150 mg by mouth daily., Disp: , Rfl:    cholecalciferol (VITAMIN D3) 25 MCG (1000 UNIT) tablet, Take 2,000 Units by mouth daily., Disp: , Rfl:    estradiol (ESTRACE) 0.1 MG/GM vaginal cream, Place 1 Applicatorful vaginally 2 (two) times a week., Disp: , Rfl:    Glucosamine HCl 1000 MG TABS, Take 1,000 mg by mouth in the morning and at bedtime., Disp: , Rfl:    metoprolol succinate (TOPROL XL) 25 MG 24 hr tablet, Take 1 tablet (25 mg total) by mouth daily., Disp: 30 tablet, Rfl: 2   Multiple Vitamin (MULTI-VITAMIN DAILY PO), Multi Vitamin, Disp: , Rfl:    Omega 3 1200 MG CAPS, Take 1,200 mg by mouth daily., Disp: , Rfl:    OVER THE COUNTER MEDICATION, Take 2-3 mLs by mouth daily. Psyllium Husk Powder (soluble fiber) 2-3 tsp, Disp: , Rfl:    OVER THE COUNTER MEDICATION, Take 1 tablet by mouth daily. Suprema dophilus (probiotic), Disp: , Rfl:    temazepam (RESTORIL) 15 MG capsule, Take 15 mg by mouth at bedtime as needed for sleep., Disp: , Rfl:    triamterene-hydrochlorothiazide (MAXZIDE-25) 37.5-25 MG per tablet, Take 0.5 tablets by mouth every morning. , Disp: , Rfl:   Laboratory examination:   Lab Results  Component Value  Date   NA 140 02/04/2020   K 3.5 02/04/2020   CO2 26 02/04/2020   GLUCOSE 113 (H) 02/04/2020   BUN 13 02/04/2020   CREATININE 0.76 02/04/2020   CALCIUM 8.7 (L) 02/04/2020   GFRNONAA >60 02/04/2020  Latest Ref Rng & Units 02/04/2020   10:20 PM 07/02/2019    4:00 AM 07/01/2019   10:40 AM  CMP  Glucose 70 - 99 mg/dL 113  99  115   BUN 8 - 23 mg/dL '13  15  17   '$ Creatinine 0.44 - 1.00 mg/dL 0.76  0.50  0.63   Sodium 135 - 145 mmol/L 140  139  139   Potassium 3.5 - 5.1 mmol/L 3.5  3.5  3.8   Chloride 98 - 111 mmol/L 103  104  102   CO2 22 - 32 mmol/L '26  26  28   '$ Calcium 8.9 - 10.3 mg/dL 8.7  8.2  8.9       Latest Ref Rng & Units 02/04/2020   10:20 PM 07/02/2019    4:00 AM 07/01/2019    5:32 PM  CBC  WBC 4.0 - 10.5 K/uL 5.8  7.2  5.4   Hemoglobin 12.0 - 15.0 g/dL 15.1  15.0  14.6   Hematocrit 36.0 - 46.0 % 42.8  42.7  42.0   Platelets 150 - 400 K/uL 236  173  203     Lipid Panel No results for input(s): "CHOL", "TRIG", "[place]", "VLDL", "HDL", "CHOLHDL", "LDLDIRECT" in the last 8760 hours.  HEMOGLOBIN A1C No results found for: "HGBA1C", "MPG" TSH No results for input(s): "TSH" in the last 8760 hours.  External labs:     Radiology:    Cardiac Studies:   No results found for this or any previous visit from the past 1095 days.     No results found for this or any previous visit from the past 1095 days.     EKG:   08/07/2021: NSR,  normal EKG  08/30/21 NSR  Assessment     ICD-10-CM   1. Palpitations  R00.2 EKG 12-Lead    2. PAF (paroxysmal atrial fibrillation) (HCC)  I48.0     3. Essential hypertension  I10        Orders Placed This Encounter  Procedures   EKG 12-Lead    No orders of the defined types were placed in this encounter.   Medications Discontinued During This Encounter  Medication Reason   amLODipine (NORVASC) 5 MG tablet      Recommendations:   Mary Sella Kessler Kopinski is a 71 y.o.  F with new paroxysmal Afib  Echo ordered last  visit - scheduled for 9/13 Event monitor placed on patient today Amlodipine discontinued and Toprol-XL started for rate control as her rates went up to 130s in Afib Eliquis '5mg'$  BID for stroke prevention as pt is CHA2DS2VASc of 3 All questions answered Keep scheduled follow up appointment    Floydene Flock, DO  08/30/2021, 1:42 PM Office: [phone] Pager: [phone]

## 2021-09-05 ENCOUNTER — Ambulatory Visit: Payer: Medicare HMO

## 2021-09-12 ENCOUNTER — Encounter: Payer: Self-pay | Admitting: Internal Medicine

## 2021-09-13 ENCOUNTER — Ambulatory Visit: Payer: Medicare HMO

## 2021-09-13 ENCOUNTER — Inpatient Hospital Stay: Payer: Medicare HMO

## 2021-09-13 DIAGNOSIS — R55 Syncope and collapse: Secondary | ICD-10-CM | POA: Diagnosis not present

## 2021-09-13 DIAGNOSIS — R0789 Other chest pain: Secondary | ICD-10-CM

## 2021-09-17 IMAGING — CR DG HIP (WITH OR WITHOUT PELVIS) 2-3V*L*
2 series · 2 of 2 positions shown · non-contrast
Comparison: None.

CLINICAL DATA: Chronic left hamstring pain.

EXAM:
DG HIP (WITH OR WITHOUT PELVIS) 2-3V LEFT

[w pelvis upright]
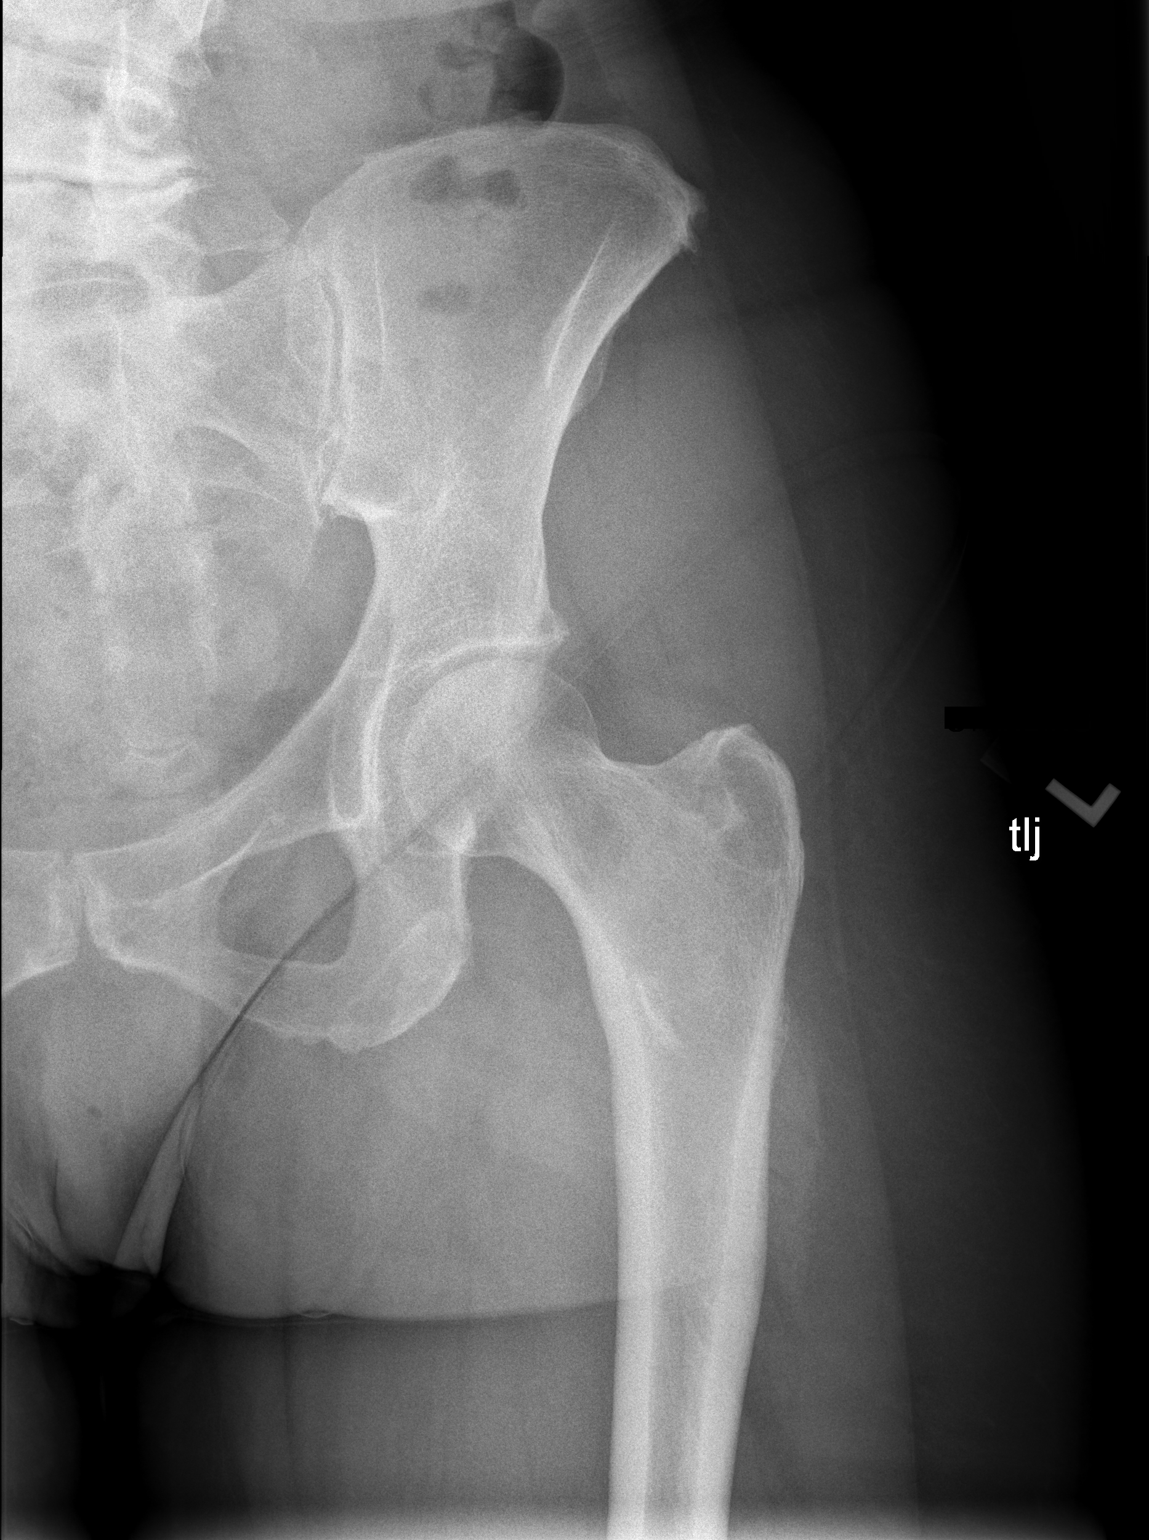

[w hip lat left]
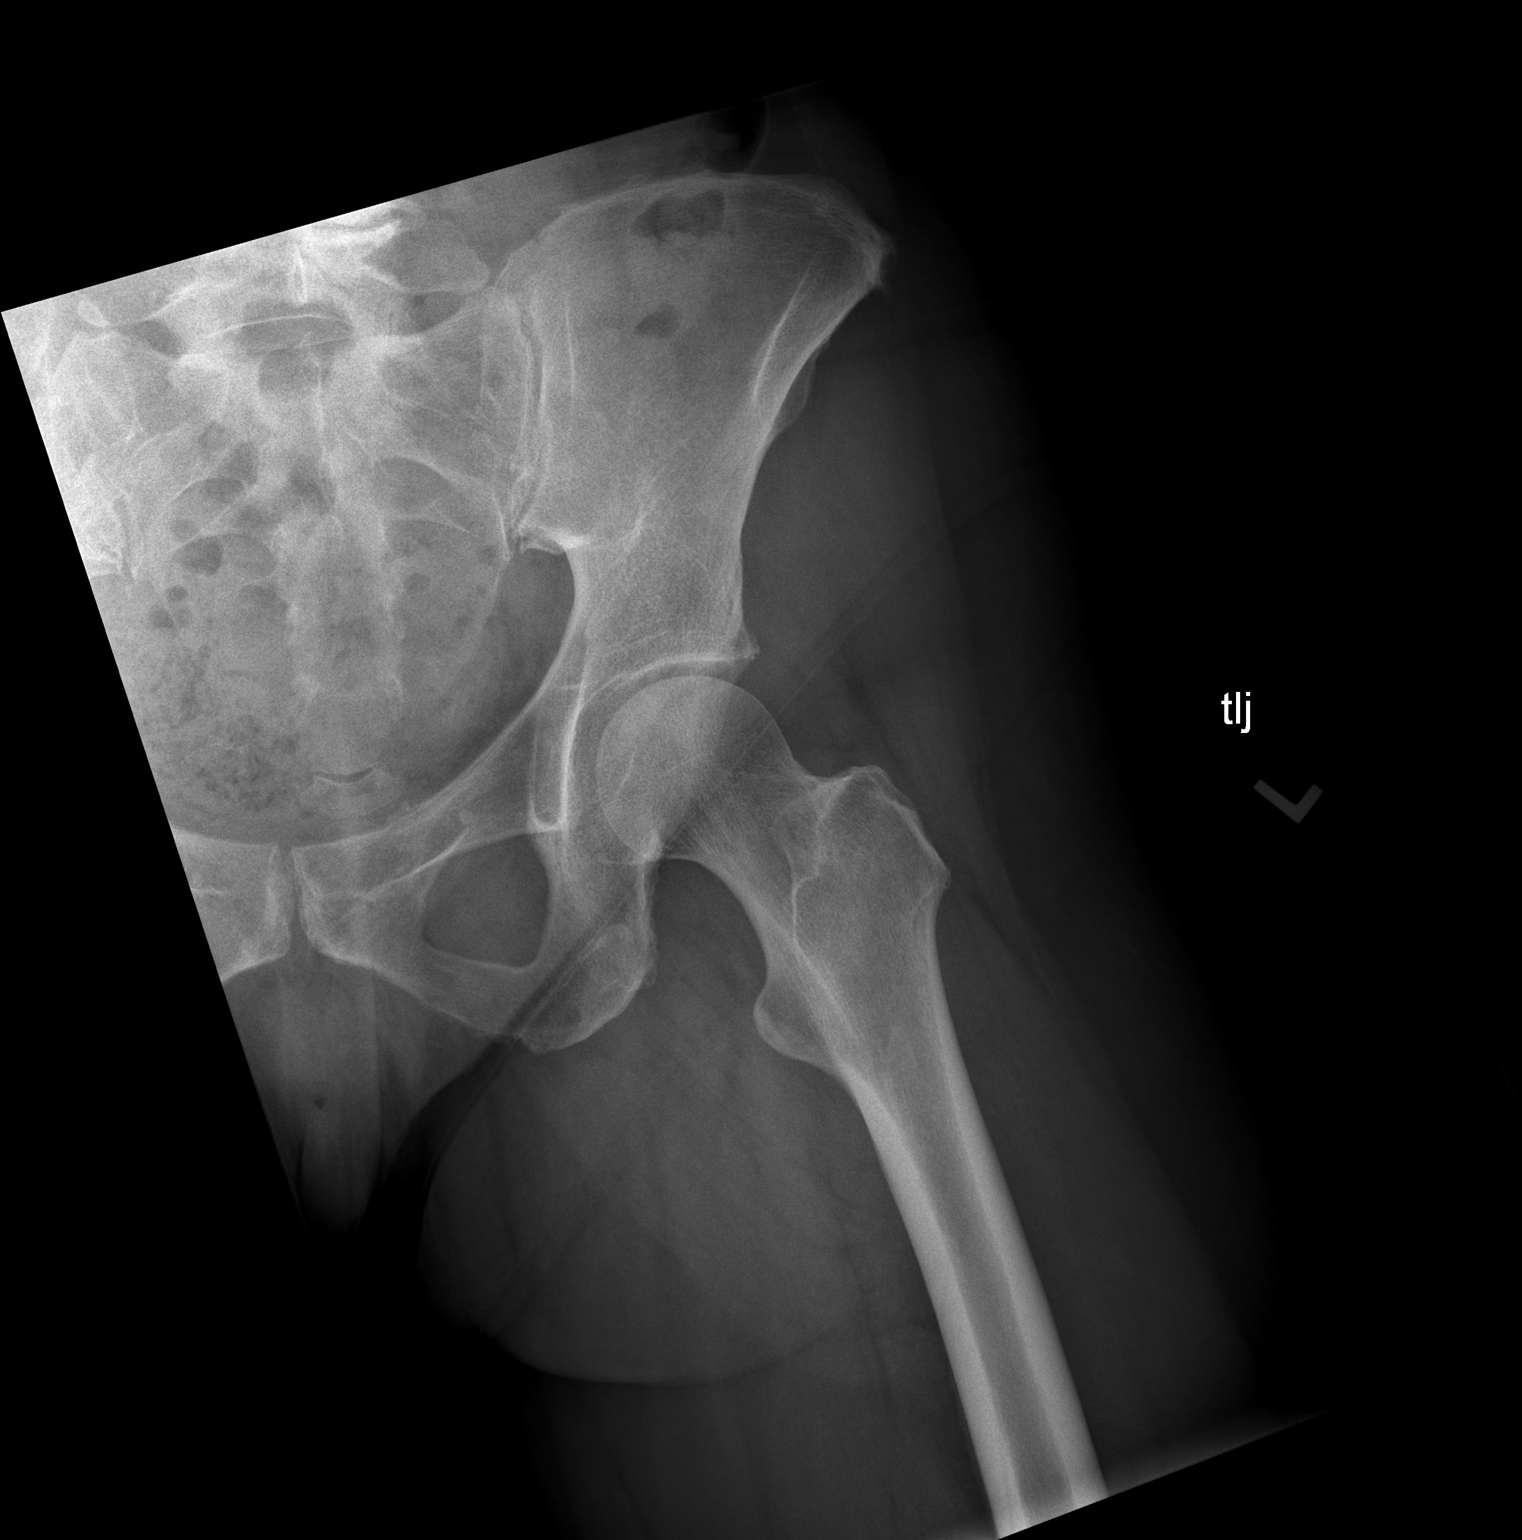

[2 of 2 positions shown; findings below may reference images not displayed]

FINDINGS: There is no evidence of hip fracture or dislocation. There is no
evidence of arthropathy or other focal bone abnormality.
IMPRESSION: Negative.

## 2021-09-21 DIAGNOSIS — R55 Syncope and collapse: Secondary | ICD-10-CM | POA: Diagnosis not present

## 2021-09-26 DIAGNOSIS — Z01419 Encounter for gynecological examination (general) (routine) without abnormal findings: Secondary | ICD-10-CM | POA: Diagnosis not present

## 2021-09-26 DIAGNOSIS — N952 Postmenopausal atrophic vaginitis: Secondary | ICD-10-CM | POA: Diagnosis not present

## 2021-09-27 ENCOUNTER — Ambulatory Visit: Payer: Medicare HMO | Admitting: Internal Medicine

## 2021-09-27 ENCOUNTER — Encounter: Payer: Self-pay | Admitting: Internal Medicine

## 2021-09-27 VITALS — BP 144/82 | HR 61 | Temp 97.9°F | Resp 16 | Ht 62.0 in | Wt 145.4 lb

## 2021-09-27 DIAGNOSIS — I10 Essential (primary) hypertension: Secondary | ICD-10-CM

## 2021-09-27 DIAGNOSIS — I480 Paroxysmal atrial fibrillation: Secondary | ICD-10-CM

## 2021-09-27 DIAGNOSIS — E782 Mixed hyperlipidemia: Secondary | ICD-10-CM

## 2021-09-27 IMAGING — MR MR PELVIS W/O CM
5 series · 32 of 48 positions shown · non-contrast
Comparison: None.

CLINICAL DATA: Pelvic pain for 8 weeks. Concern for hamstring
tendon injury.

EXAM:
MRI PELVIS WITHOUT CONTRAST
TECHNIQUE: Multiplanar multisequence MR imaging of the pelvis was performed. No
intravenous contrast was administered.

[Series 3: STIR · coronal · 4.0mm · 0.70mm/px · 7 of 31 slices shown]
[im 1/31]
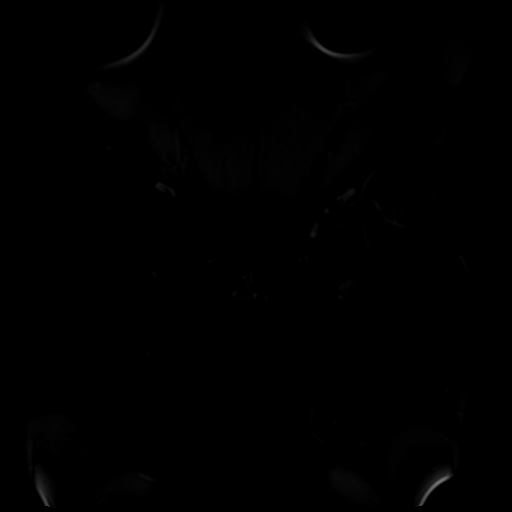
[im 6/31]
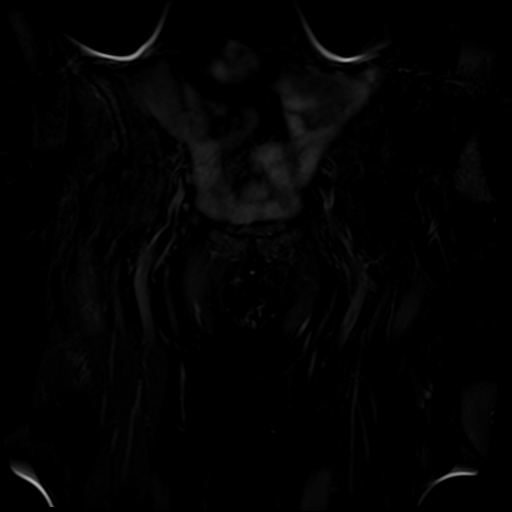
[im 11/31]
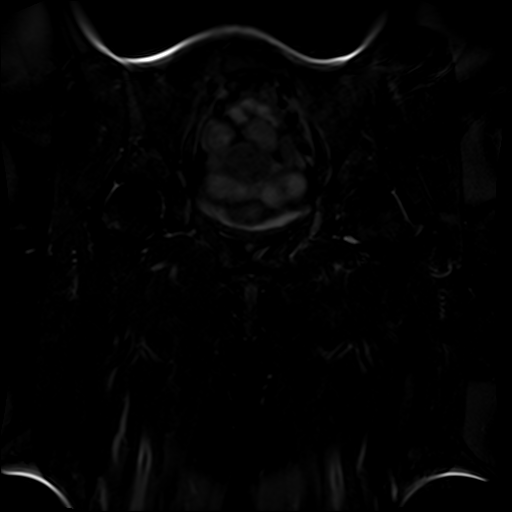
[im 16/31]
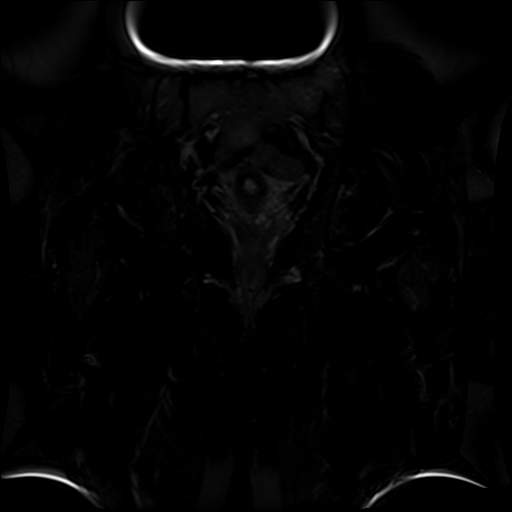
[im 21/31]
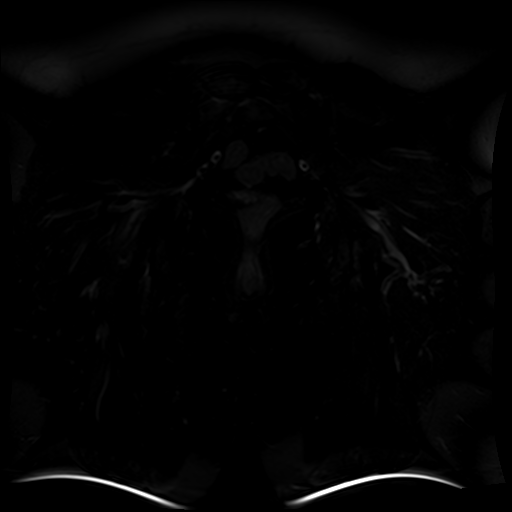
[im 26/31]
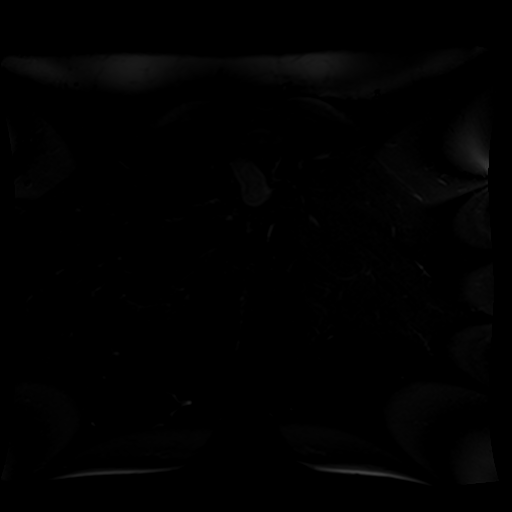
[im 31/31]
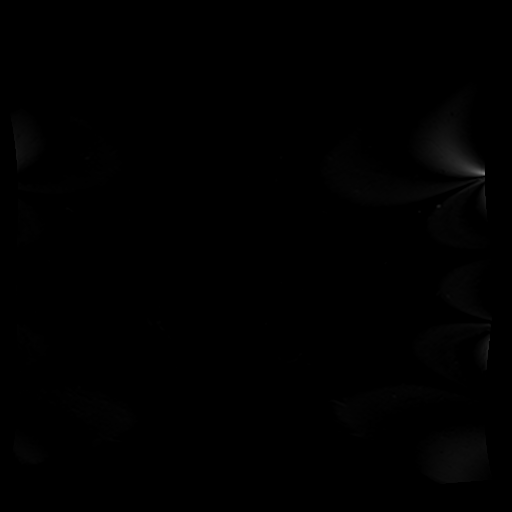

[Series 4: T1 · coronal · 4.0mm · 1.12mm/px · 7 of 31 slices shown (1 of 2)]
[im 1/31]
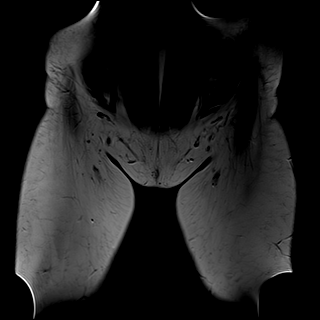
[im 6/31]
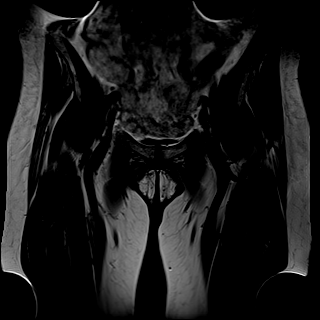
[im 11/31]
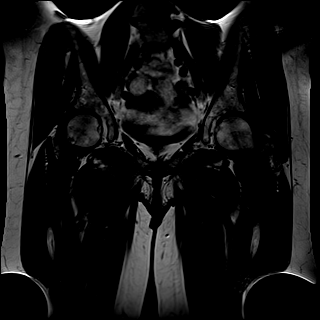
[im 16/31]
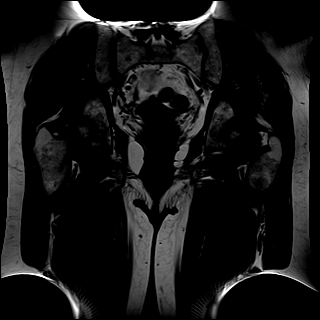
[im 21/31]
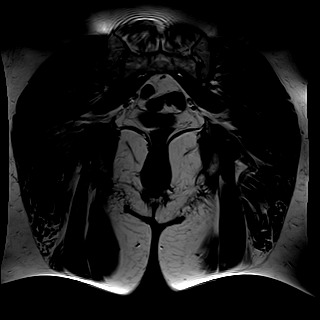
[im 26/31]
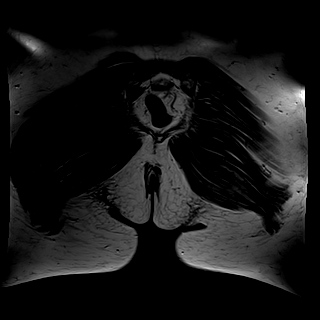
[im 31/31]
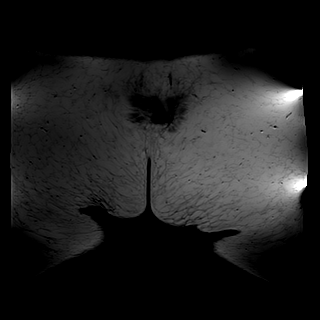

[Series 6: T1 · axial · 4.0mm · 1.00mm/px · z∈[-97,+130]mm · 8 of 43 slices shown (2 of 2)]
[im 1/43]
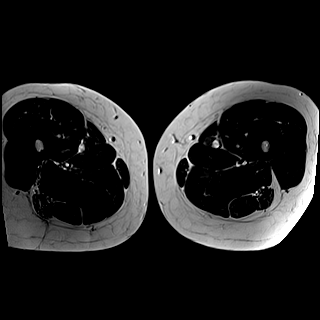
[im 5/43]
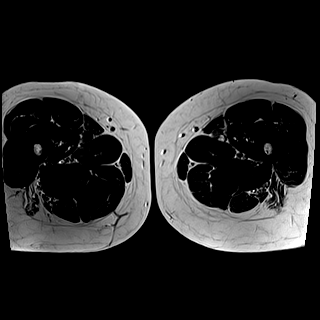
[im 15/43]
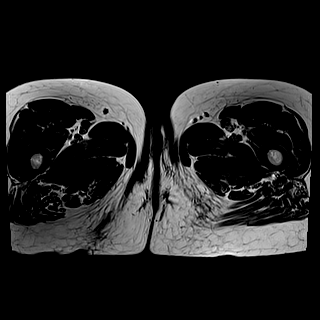
[im 19/43]
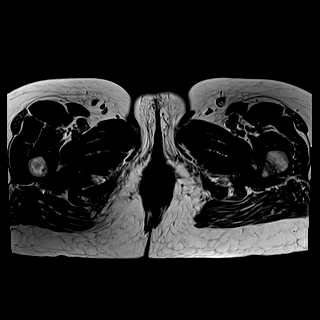
[im 24/43]
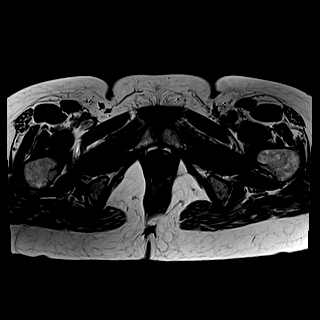
[im 29/43]
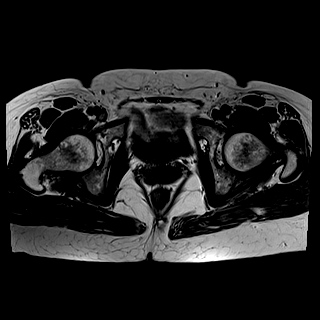
[im 38/43]
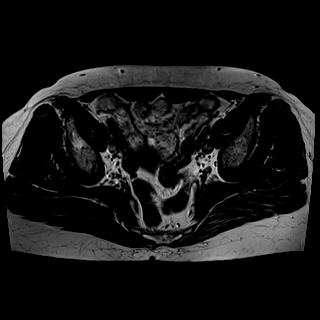
[im 43/43]
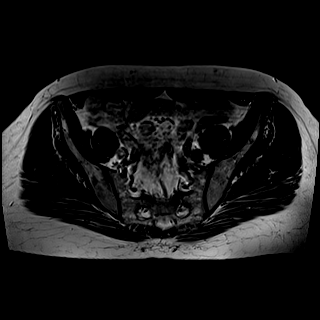

[Series 7: T2 fat-sat · axial · 4.0mm · 1.00mm/px · z∈[-97,+130]mm · 8 of 43 slices shown (1 of 2)]
[im 1/43]
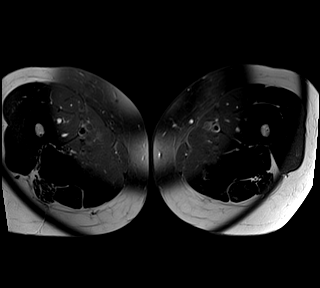
[im 5/43]
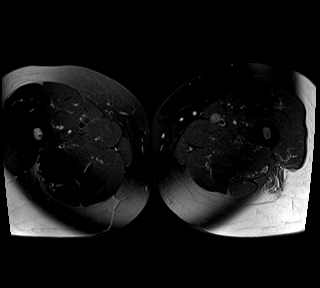
[im 15/43]
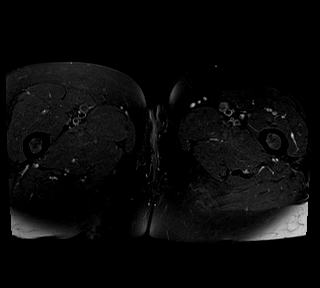
[im 19/43]
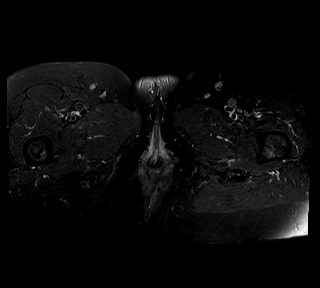
[im 24/43]
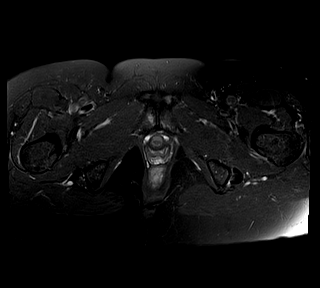
[im 29/43]
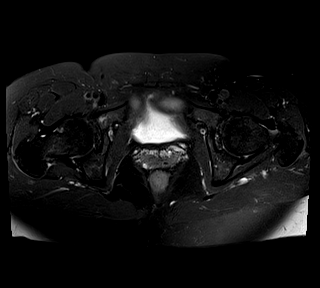
[im 38/43]
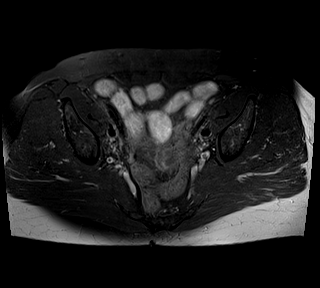
[im 43/43]
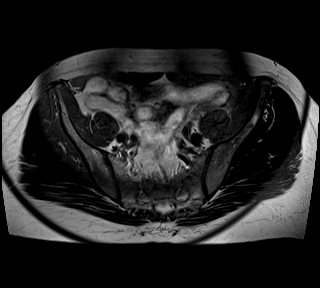

[Series 8: T2 fat-sat · sagittal · 4.0mm · 0.51mm/px · 2 of 58 slices shown (2 of 2)]
[im 1/58]
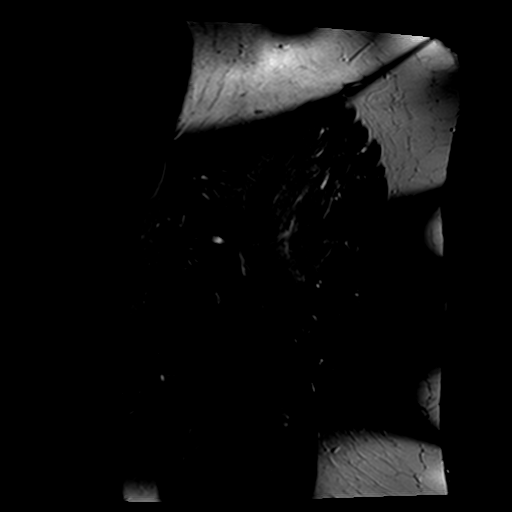
[im 9/58]
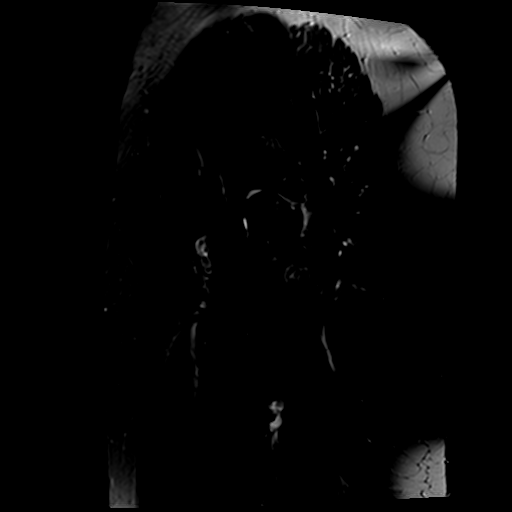

[32 of 48 positions shown; findings below may reference images not displayed]

FINDINGS: Bones/Joint/Cartilage

No acute fracture. No dislocation. No femoral head avascular
necrosis. Bony pelvis intact without diastasis. Sacroiliac joints
are intact without significant arthropathy or evidence of
sacroiliitis. Mild arthropathy at the pubic symphysis with mild
reactive subchondral marrow edema. Elsewhere, no bone marrow edema.
No marrow replacing bone lesion.

Ligaments

Intact.

Muscles and Tendons

Mild tendinosis of the bilateral hamstring tendon origins with
subtle low-grade partial-thickness tears. No full-thickness or
retracted hamstring tendon tear. Trace fluid within the
ischiogluteal bursa bilaterally. The gluteal, iliopsoas, rectus
femoris, and adductor tendons appear intact without tear or
significant tendinosis. Normal muscle bulk and signal intensity
without edema, atrophy, or fatty infiltration.

Soft tissues

No soft tissue edema or fluid collection. No inguinal
lymphadenopathy. No acute findings are seen within the pelvis.
IMPRESSION: 1. Mild tendinosis of the bilateral hamstring tendon origins with
subtle low-grade partial-thickness tears. No full-thickness or
retracted hamstring tendon tear.
2. Mild arthropathy at the pubic symphysis with mild reactive
subchondral marrow edema.

## 2021-09-27 NOTE — Progress Notes (Signed)
Primary Physician/Referring:  Prince Solian, MD  Patient ID: Danielle Hayes, female    DOB: Feb 06, 1950, 71 y.o.   MRN: [id number]  Chief Complaint  Patient presents with   Follow-up   Atrial Fibrillation   Hypertension    HPI:    Danielle Hayes  is a 71 y.o. female with hypertension and paroxysmal atrial fibrillation who is here for a follow-up visit. She is tolerating Eliquis and Toprol well at this point. Heart rate is well controlled with minimal palpitations. Denies chest pain, shortness of breath, diaphoresis, syncope, edema, PND, orthopnea. Discussed event monitor, nuclear stress test, and echocardiogram results with patient.    Past Medical History:  Diagnosis Date   Anxiety    Arthritis    Bronchitis    C. difficile diarrhea 10/2014   states was related to antibiotic-? cefdinir   Complication of anesthesia    has big time phobia from nausea vomting last time got sick was at 71 years old    Dysrhythmia    PVC's   Ear infection 10/2015   Hyperlipidemia    Hypertension    PONV (postoperative nausea and vomiting)    Past Surgical History:  Procedure Laterality Date   KNEE ARTHROSCOPY     right and left   KNEE ARTHROSCOPY     right, left   KNEE CLOSED REDUCTION Left 08/09/2014   Procedure: LEFT KNEE CLOSED MANIPULATION ;  Surgeon: Gaynelle Arabian, MD;  Location: [facility];  Service: Orthopedics;  Laterality: Left;   LEEP     TONSILLECTOMY     TOTAL KNEE ARTHROPLASTY Left 06/28/2014   Procedure: LEFT TOTAL KNEE ARTHROPLASTY;  Surgeon: Gaynelle Arabian, MD;  Location: [facility];  Service: Orthopedics;  Laterality: Left;   TOTAL KNEE ARTHROPLASTY Right 10/17/2015   Procedure: RIGHT TOTAL KNEE ARTHROPLASTY;  Surgeon: Gaynelle Arabian, MD;  Location: [facility];  Service: Orthopedics;  Laterality: Right;   Family History  Problem Relation Age of Onset   Heart disease Mother    Alzheimer's disease Father    Breast cancer Maternal Aunt    Colon cancer Neg Hx    Pancreatic  cancer Neg Hx    Rectal cancer Neg Hx    Stomach cancer Neg Hx     Social History   Tobacco Use   Smoking status: Never   Smokeless tobacco: Never  Substance Use Topics   Alcohol use: Yes    Alcohol/week: 6.0 standard drinks of alcohol    Types: 6 Glasses of wine per week    Comment: weekly   Marital Status: Married  ROS  Review of Systems  Constitutional: Negative.  Cardiovascular:  Positive for irregular heartbeat. Negative for claudication, cyanosis, near-syncope, palpitations and syncope.  Respiratory: Negative.    Gastrointestinal: Negative.   Neurological: Negative.   All other systems reviewed and are negative.  Objective  Blood pressure (!) 144/82, pulse 61, temperature 97.9 F (36.6 C), temperature source Temporal, resp. rate 16, height '5\' 2"'$  (1.575 m), weight 145 lb 6.4 oz (66 kg), SpO2 98 %. Body mass index is 26.59 kg/m.     09/27/2021    9:22 AM 08/30/2021   10:56 AM 08/07/2021    1:53 PM  Vitals with BMI  Height '5\' 2"'$  '5\' 2"'$  '5\' 2"'$   Weight 145 lbs 6 oz 144 lbs 145 lbs  BMI 26.59 73.41 93.79  Systolic 024 097 353  Diastolic 82 84 95  Pulse 61 76 72     Physical Exam Vitals (repeat BP  by me 136/80) and nursing note reviewed.  Constitutional:      General: She is not in acute distress.    Appearance: Normal appearance.  HENT:     Head: Normocephalic and atraumatic.  Neck:     Vascular: No carotid bruit.  Cardiovascular:     Rate and Rhythm: Normal rate and regular rhythm.     Pulses: Normal pulses.     Heart sounds: Normal heart sounds. No murmur heard.    No friction rub. No gallop.  Pulmonary:     Effort: Pulmonary effort is normal.     Breath sounds: Normal breath sounds.  Abdominal:     General: Abdomen is flat. Bowel sounds are normal.     Palpations: Abdomen is soft.  Musculoskeletal:     Right lower leg: No edema.     Left lower leg: No edema.  Skin:    General: Skin is warm and dry.  Neurological:     Mental Status: She is alert.    Medications and allergies   Allergies  Allergen Reactions   Cefdinir Other (See Comments)    c diff   Chlorhexidine Itching    Itchy and rash   Hydrocodone Nausea Only   Oxycodone Other (See Comments)    hallucinations   Synvisc [Hylan G-F 20] Swelling    Red, could not walk , rooster injections into knee   Xarelto [Rivaroxaban] Hives     Medication list after today's encounter   Current Outpatient Medications:    albuterol (VENTOLIN HFA) 108 (90 Base) MCG/ACT inhaler, Inhale 1-2 puffs into the lungs every 4 (four) hours as needed for wheezing or shortness of breath., Disp: , Rfl:    apixaban (ELIQUIS) 5 MG TABS tablet, Take 1 tablet (5 mg total) by mouth 2 (two) times daily., Disp: 60 tablet, Rfl: 2   atorvastatin (LIPITOR) 40 MG tablet, Take 40 mg by mouth every morning. , Disp: , Rfl:    buPROPion (WELLBUTRIN XL) 150 MG 24 hr tablet, Take 150 mg by mouth daily., Disp: , Rfl:    cholecalciferol (VITAMIN D3) 25 MCG (1000 UNIT) tablet, Take 2,000 Units by mouth daily., Disp: , Rfl:    estradiol (ESTRACE) 0.1 MG/GM vaginal cream, Place 1 Applicatorful vaginally 2 (two) times a week., Disp: , Rfl:    Glucosamine HCl 1000 MG TABS, Take 1,000 mg by mouth in the morning and at bedtime., Disp: , Rfl:    metoprolol succinate (TOPROL XL) 25 MG 24 hr tablet, Take 1 tablet (25 mg total) by mouth daily., Disp: 30 tablet, Rfl: 2   Multiple Vitamin (MULTI-VITAMIN DAILY PO), Multi Vitamin, Disp: , Rfl:    Omega 3 1200 MG CAPS, Take 1,200 mg by mouth daily., Disp: , Rfl:    OVER THE COUNTER MEDICATION, Take 2-3 mLs by mouth daily. Psyllium Husk Powder (soluble fiber) 2-3 tsp, Disp: , Rfl:    OVER THE COUNTER MEDICATION, Take 1 tablet by mouth daily. Suprema dophilus (probiotic), Disp: , Rfl:    temazepam (RESTORIL) 15 MG capsule, Take 15 mg by mouth at bedtime as needed for sleep., Disp: , Rfl:    triamterene-hydrochlorothiazide (MAXZIDE-25) 37.5-25 MG per tablet, Take 0.5 tablets by mouth every  morning. , Disp: , Rfl:   Laboratory examination:   Lab Results  Component Value Date   NA 140 02/04/2020   K 3.5 02/04/2020   CO2 26 02/04/2020   GLUCOSE 113 (H) 02/04/2020   BUN 13 02/04/2020   CREATININE 0.76 02/04/2020  CALCIUM 8.7 (L) 02/04/2020   GFRNONAA >60 02/04/2020       Latest Ref Rng & Units 02/04/2020   10:20 PM 07/02/2019    4:00 AM 07/01/2019   10:40 AM  CMP  Glucose 70 - 99 mg/dL 113  99  115   BUN 8 - 23 mg/dL '13  15  17   '$ Creatinine 0.44 - 1.00 mg/dL 0.76  0.50  0.63   Sodium 135 - 145 mmol/L 140  139  139   Potassium 3.5 - 5.1 mmol/L 3.5  3.5  3.8   Chloride 98 - 111 mmol/L 103  104  102   CO2 22 - 32 mmol/L '26  26  28   '$ Calcium 8.9 - 10.3 mg/dL 8.7  8.2  8.9       Latest Ref Rng & Units 02/04/2020   10:20 PM 07/02/2019    4:00 AM 07/01/2019    5:32 PM  CBC  WBC 4.0 - 10.5 K/uL 5.8  7.2  5.4   Hemoglobin 12.0 - 15.0 g/dL 15.1  15.0  14.6   Hematocrit 36.0 - 46.0 % 42.8  42.7  42.0   Platelets 150 - 400 K/uL 236  173  203     Lipid Panel No results for input(s): "CHOL", "TRIG", "LDLCALC", "VLDL", "HDL", "CHOLHDL", "LDLDIRECT" in the last 8760 hours.  HEMOGLOBIN A1C No results found for: "HGBA1C", "MPG" TSH No results for input(s): "TSH" in the last 8760 hours.  External labs:     Radiology:    Cardiac Studies:   Patch Wear Time:  8 days and 15 hours (2023-08-30T12:07:25-0400 to 2023-09-12T10:35:01-398)   Monitor 1 Patient had a min HR of 47 bpm, max HR of 164 bpm, and avg HR of 71 bpm. Predominant underlying rhythm was Sinus Rhythm. First Degree AV Block was present. 6 Supraventricular Tachycardia runs occurred, the run with the fastest interval lasting 6 beats with a max rate of 164 bpm, the longest lasting 10 beats with an avg rate of 113 bpm. Some episodes of Supraventricular Tachycardia may be possible Atrial Tachycardia with variable block. Isolated SVEs were rare (<1.0%), SVE Couplets were rare (<1.0%), and SVE Triplets were rare  (<1.0%). Isolated VEs were occasional (1.0%, 1923), VE Couplets were rare (<1.0%, 33), and VE Triplets were rare (<1.0%, 1). Ventricular Bigeminy and Trigeminy were present.   Monitor 2 Patient had a min HR of 43 bpm, max HR of 133 bpm, and avg HR of 65 bpm. Predominant underlying rhythm was Sinus Rhythm. 12 Supraventricular Tachycardia runs occurred, the run with the fastest interval lasting 4 beats with a max rate of 133 bpm, the longest lasting 10 beats with an avg rate of 113 bpm. Isolated SVEs were occasional (1.2%, 7305), SVE Couplets were rare (<1.0%, 144), and SVE Triplets were rare (<1.0%, 26). Isolated VEs were rare (<1.0%), VE Couplets were rare (<1.0%), and no VE Triplets were present. Ventricular Trigeminy was present.    Echocardiogram 09/13/2021:  Normal LV systolic function with visual EF 60-65%. Left ventricle cavity  is normal in size. Normal left ventricular wall thickness. Normal global  wall motion. Normal diastolic filling pattern, normal LAP. Calculated EF 68%.  Structurally normal tricuspid valve with trace regurgitation. No evidence  of pulmonary hypertension. no prior available for comparison. Normal echocardiogram.   Exercise nuclear stress test 08/16/21 Myocardial perfusion is normal. Overall LV systolic function is normal without regional wall motion abnormalities. Stress LV EF: 74%.  Low risk study. Normal ECG stress. The patient  exercised for 5 minutes and 13 seconds of a Modified Bruce protocol, achieving approximately 7.05 METs and 101% MPHR. The heart rate response was normal. The blood pressure response was normal. No previous exam available for comparison.    EKG:   08/07/2021: NSR,  normal EKG  08/30/21 NSR  Assessment     ICD-10-CM   1. PAF (paroxysmal atrial fibrillation) (HCC)  I48.0     2. Essential hypertension  I10     3. Mixed hyperlipidemia  E78.2         No orders of the defined types were placed in this encounter.   No orders of  the defined types were placed in this encounter.   There are no discontinued medications.    Recommendations:   Cedra Villalon is a 71 y.o.  female with paroxysmal Afib, HLD, and HTN  Hyperlipidemia Continue Lipitor   Hypertension BP slightly elevated today, but well controlled at home Continue Toprol-XL, Maxzide Encourage low-sodium diet, less than 2000 mg daily.   Paroxysmal Atrial Fibrillation On Toprol-XL for rate control, can consider switching to Cardizem if her estrogen medication interferes with Toprol Continue Eliquis '5mg'$  BID for stroke prevention as pt is CHA2DS2VASc of 3  Do not recommend adding any estrogen medication that interferes with Eliquis  Follow-up in 6 months or sooner if needed.    Floydene Flock, DO  09/27/2021, 10:53 AM Office: [phone] Pager: [phone]

## 2021-09-28 ENCOUNTER — Encounter: Payer: Self-pay | Admitting: Internal Medicine

## 2021-09-28 NOTE — Telephone Encounter (Signed)
From patient.

## 2021-10-17 ENCOUNTER — Other Ambulatory Visit: Payer: Self-pay | Admitting: Internal Medicine

## 2021-10-17 DIAGNOSIS — R399 Unspecified symptoms and signs involving the genitourinary system: Secondary | ICD-10-CM | POA: Diagnosis not present

## 2021-10-27 DIAGNOSIS — E785 Hyperlipidemia, unspecified: Secondary | ICD-10-CM | POA: Diagnosis not present

## 2021-10-27 DIAGNOSIS — I10 Essential (primary) hypertension: Secondary | ICD-10-CM | POA: Diagnosis not present

## 2021-10-27 DIAGNOSIS — R7989 Other specified abnormal findings of blood chemistry: Secondary | ICD-10-CM | POA: Diagnosis not present

## 2021-10-31 DIAGNOSIS — R0789 Other chest pain: Secondary | ICD-10-CM | POA: Diagnosis not present

## 2021-10-31 DIAGNOSIS — R55 Syncope and collapse: Secondary | ICD-10-CM | POA: Diagnosis not present

## 2021-11-03 DIAGNOSIS — K219 Gastro-esophageal reflux disease without esophagitis: Secondary | ICD-10-CM | POA: Diagnosis not present

## 2021-11-03 DIAGNOSIS — I10 Essential (primary) hypertension: Secondary | ICD-10-CM | POA: Diagnosis not present

## 2021-11-03 DIAGNOSIS — E785 Hyperlipidemia, unspecified: Secondary | ICD-10-CM | POA: Diagnosis not present

## 2021-11-03 DIAGNOSIS — M170 Bilateral primary osteoarthritis of knee: Secondary | ICD-10-CM | POA: Diagnosis not present

## 2021-11-03 DIAGNOSIS — Z23 Encounter for immunization: Secondary | ICD-10-CM | POA: Diagnosis not present

## 2021-11-03 DIAGNOSIS — R82998 Other abnormal findings in urine: Secondary | ICD-10-CM | POA: Diagnosis not present

## 2021-11-03 DIAGNOSIS — Z0000 Encounter for general adult medical examination without abnormal findings: Secondary | ICD-10-CM | POA: Diagnosis not present

## 2021-11-03 DIAGNOSIS — I480 Paroxysmal atrial fibrillation: Secondary | ICD-10-CM | POA: Diagnosis not present

## 2021-11-03 DIAGNOSIS — J45909 Unspecified asthma, uncomplicated: Secondary | ICD-10-CM | POA: Diagnosis not present

## 2021-11-03 DIAGNOSIS — R69 Illness, unspecified: Secondary | ICD-10-CM | POA: Diagnosis not present

## 2021-11-03 DIAGNOSIS — K589 Irritable bowel syndrome without diarrhea: Secondary | ICD-10-CM | POA: Diagnosis not present

## 2021-11-03 DIAGNOSIS — R1310 Dysphagia, unspecified: Secondary | ICD-10-CM | POA: Diagnosis not present

## 2021-11-07 ENCOUNTER — Other Ambulatory Visit: Payer: Self-pay | Admitting: Internal Medicine

## 2021-11-07 DIAGNOSIS — Z1231 Encounter for screening mammogram for malignant neoplasm of breast: Secondary | ICD-10-CM

## 2021-11-13 ENCOUNTER — Encounter: Payer: Self-pay | Admitting: Internal Medicine

## 2021-11-13 NOTE — Telephone Encounter (Signed)
From patient.

## 2021-11-13 NOTE — Telephone Encounter (Signed)
If she's not symptomatic its ok. I dont think those other meds would affect it. We can double her toprol if she is having symptoms though

## 2021-11-13 NOTE — Telephone Encounter (Signed)
From pt

## 2021-11-14 ENCOUNTER — Encounter: Payer: Self-pay | Admitting: Internal Medicine

## 2021-11-15 ENCOUNTER — Other Ambulatory Visit: Payer: Self-pay

## 2021-11-20 ENCOUNTER — Other Ambulatory Visit: Payer: Self-pay

## 2021-11-20 MED ORDER — METOPROLOL SUCCINATE ER 25 MG PO TB24: 25.0000 mg | ORAL_TABLET | Freq: Every day | ORAL | 0 refills | Status: DC

## 2021-12-14 DIAGNOSIS — Z96651 Presence of right artificial knee joint: Secondary | ICD-10-CM | POA: Diagnosis not present

## 2022-01-05 ENCOUNTER — Encounter: Payer: Self-pay | Admitting: Internal Medicine

## 2022-01-05 ENCOUNTER — Ambulatory Visit: Payer: Medicare HMO | Admitting: Internal Medicine

## 2022-01-05 VITALS — BP 140/100 | HR 60 | Ht 62.0 in | Wt 148.0 lb

## 2022-01-05 DIAGNOSIS — R1310 Dysphagia, unspecified: Secondary | ICD-10-CM | POA: Diagnosis not present

## 2022-01-05 DIAGNOSIS — R197 Diarrhea, unspecified: Secondary | ICD-10-CM | POA: Diagnosis not present

## 2022-01-05 DIAGNOSIS — Z7901 Long term (current) use of anticoagulants: Secondary | ICD-10-CM

## 2022-01-05 DIAGNOSIS — Z1211 Encounter for screening for malignant neoplasm of colon: Secondary | ICD-10-CM | POA: Diagnosis not present

## 2022-01-05 MED ORDER — NA SULFATE-K SULFATE-MG SULF 17.5-3.13-1.6 GM/177ML PO SOLN: 1.0000 | Freq: Once | ORAL | 0 refills | Status: AC

## 2022-01-05 NOTE — Progress Notes (Signed)
HISTORY OF PRESENT ILLNESS:  Danielle Hayes is a 72 y.o. female with multiple medical problems as listed below including history of atrial fibrillation for which she is on Eliquis (ejection fraction 68% no history of TIA or CVA).  Patient presents today regarding dysphagia and worsening diarrhea.  I last saw the patient in July 2015 for routine screening colonoscopy.  Examination was normal.  She was last seen in this office by the GI physician assistant February 2020 regarding loose malodorous stools.  See that dictation for details.  Blood work was unremarkable as were stool studies.  Testing for celiac disease was negative.  She has not been seen since.  First, patient reports several year history of difficulty swallowing.  She describes multiple things including globus type sensation, hiccups with meals, and a sensation of slow passage of both solids and liquids with equal frequency.  No heartburn.  No weight loss.  She was placed on PPI empirically by her PCP.  Pantoprazole 40 mg daily over the past 6 weeks.  This has not changed symptoms.  She tells me that she has some sort of issue with swallowing at least twice per day.  No prior upper endoscopy.  Next, she describes herself as having IBS since her 76s.  However, over the past 8 months she has had more frequent issues with loose stools.  At times significant urgency.  No incontinence, though she is careful when going out in public.  She tells me that she can upcoming ski trip to [facility].  She wonders if there is something she might do for diarrhea until this workup can be completed.  She denies rectal bleeding.  Of interest, she tells me that her brother was recently diagnosed with celiac disease.  In by her PCP in the office November 03, 2021.  Reviewed.  Most recent blood work from October 27, 2021 shows unremarkable CBC with hemoglobin 15.9.  Total cholesterol 190.  Normal liver tests and renal function.  Albumin 3.8.  REVIEW  OF SYSTEMS:  All non-GI ROS negative unless otherwise stated in HPI except for night sweats, irregular heartbeat  Past Medical History:  Diagnosis Date   Anxiety    Arthritis    Asthma    Atrial fibrillation (HCC)    Bronchitis    C. difficile diarrhea 10/2014   states was related to antibiotic-? cefdinir   Complication of anesthesia    has big time phobia from nausea vomting last time got sick was at 72 years old    Dysrhythmia    PVC's   Ear infection 10/2015   Hyperlipidemia    Hypertension    PONV (postoperative nausea and vomiting)     Past Surgical History:  Procedure Laterality Date   KNEE ARTHROSCOPY     right and left   KNEE ARTHROSCOPY     right, left   KNEE CLOSED REDUCTION Left 08/09/2014   Procedure: LEFT KNEE CLOSED MANIPULATION ;  Surgeon: Gaynelle Arabian, MD;  Location: [facility];  Service: Orthopedics;  Laterality: Left;   LEEP     TONSILLECTOMY     TOTAL KNEE ARTHROPLASTY Left 06/28/2014   Procedure: LEFT TOTAL KNEE ARTHROPLASTY;  Surgeon: Gaynelle Arabian, MD;  Location: [facility];  Service: Orthopedics;  Laterality: Left;   TOTAL KNEE ARTHROPLASTY Right 10/17/2015   Procedure: RIGHT TOTAL KNEE ARTHROPLASTY;  Surgeon: Gaynelle Arabian, MD;  Location: [facility];  Service: Orthopedics;  Laterality: Right;    Social History Lizett Chowning  reports  that she has never smoked. She has never used smokeless tobacco. She reports current alcohol use of about 6.0 standard drinks of alcohol per week. She reports that she does not use drugs.  family history includes Alzheimer's disease in her father; Breast cancer in her maternal aunt; Celiac disease in her brother; Colon polyps in her brother; Heart disease in her mother.  Allergies  Allergen Reactions   Cefdinir Other (See Comments)    c diff   Chlorhexidine Itching    Itchy and rash   Hydrocodone Nausea Only   Oxycodone Other (See Comments)    hallucinations   Synvisc [Hylan G-F 20] Swelling    Red, could not walk ,  rooster injections into knee   Xarelto [Rivaroxaban] Hives       PHYSICAL EXAMINATION: Vital signs: BP (!) 140/100   Pulse 60   Ht '5\' 2"'$  (1.575 m)   Wt 148 lb (67.1 kg)   BMI 27.07 kg/m   Constitutional: generally well-appearing, no acute distress Psychiatric: alert and oriented x3, cooperative Eyes: extraocular movements intact, anicteric, conjunctiva pink Mouth: oral pharynx moist, no lesions Neck: supple no lymphadenopathy Cardiovascular: heart regular rate and rhythm, no murmur Lungs: clear to auscultation bilaterally Abdomen: soft, nontender, nondistended, no obvious ascites, no peritoneal signs, normal bowel sounds, no organomegaly Rectal: Deferred until colonoscopy Extremities: no clubbing, cyanosis, or lower extremity edema bilaterally Skin: no lesions on visible extremities Neuro: No focal deficits.  Cranial nerves intact  ASSESSMENT:  1.  Somewhat vague dysphagia and hiccups as described.  No response to PPI.  Etiology unclear.  Rule out stricture.  Rule out motility disorder. 2.  Worsening diarrhea.  Rule out microscopic colitis.  Rule out celiac disease (given brothers history, despite negative serologies previously). 3.  History of atrial fibrillation on Eliquis.  Stable.  Regular rate and rhythm today. 4.  Other general medical problems, stable.  Under the care of Dr. Dagmar Hait   PLAN:  1.  Okay to stop Protonix (the patient required) as this has not helped over the past 6 weeks. 2.  Schedule upper endoscopy with possible esophageal dilation.  The patient is high risk given her chronic anticoagulation state.The nature of the procedure, as well as the risks, benefits, and alternatives were carefully and thoroughly reviewed with the patient. Ample time for discussion and questions allowed. The patient understood, was satisfied, and agreed to proceed. 3.  Schedule colonoscopy with biopsies to rule out microscopic colitis and provide follow-up colon cancer screening.   Patient is high risk as above.The nature of the procedure, as well as the risks, benefits, and alternatives were carefully and thoroughly reviewed with the patient. Ample time for discussion and questions allowed. The patient understood, was satisfied, and agreed to proceed. 4.  Okay to use Imodium as needed for diarrhea 5.  Hold Eliquis 2 days prior to procedure.  Would anticipate resumption immediately post procedure unless significant therapeutics required.  The risks and benefits of interrupting any coagulation therapy for short period of time discussed.  She understands. A total time of 60 minutes was spent counseling the patient, reviewing myriad of outside records data, obtaining comprehensive history, performing medically appropriate physical examination, counseling and educating patient regarding the above listed issues, ordering advanced multiple therapeutic endoscopic procedures, directing anticoagulation therapy, and documenting clinical information in the health record

## 2022-01-05 NOTE — Patient Instructions (Signed)
_______________________________________________________  If you are age 72 or older, your body mass index should be between 23-30. Your Body mass index is 27.07 kg/m. If this is out of the aforementioned range listed, please consider follow up with your Primary Care Provider.  If you are age 78 or younger, your body mass index should be between 19-25. Your Body mass index is 27.07 kg/m. If this is out of the aformentioned range listed, please consider follow up with your Primary Care Provider.   ________________________________________________________  The [facility] providers would like to encourage you to use [facility] to communicate with providers for non-urgent requests or questions.  Due to long hold times on the telephone, sending your provider a message by [facility] may be a faster and more efficient way to get a response.  Please allow 48 business hours for a response.  Please remember that this is for non-urgent requests.  _______________________________________________________  Dennis Bast have been scheduled for an endoscopy and colonoscopy. Please follow the written instructions given to you at your visit today. Please pick up your prep supplies at the pharmacy within the next 1-3 days. If you use inhalers (even only as needed), please bring them with you on the day of your procedure.

## 2022-01-18 DIAGNOSIS — R69 Illness, unspecified: Secondary | ICD-10-CM | POA: Diagnosis not present

## 2022-01-29 ENCOUNTER — Encounter: Payer: Self-pay | Admitting: Internal Medicine

## 2022-01-29 NOTE — Telephone Encounter (Signed)
From patient.

## 2022-01-30 ENCOUNTER — Telehealth: Payer: Self-pay | Admitting: Internal Medicine

## 2022-01-30 NOTE — Telephone Encounter (Signed)
PT is sick w/a cold, coughing, congestions. History of Bron induces asthma. Fears it may go into pneumonia. Pls call to advise. No appts avail with NP until 2/8 for her. She refused what I offered. Pls call @ [phone]

## 2022-01-30 NOTE — Telephone Encounter (Signed)
Primary Pulmonologist: Danielle Hayes Last office visit and with whom: 03/07/2020 Danielle Hayes What do we see them for (pulmonary problems): Asthma, seasonal allergic rhinitis d/t pollen Last OV assessment/plan:     Danielle Geralds, MD Pulmonary Disease Mild intermittent asthma without complication +1 more Dx Follow-up ; Referred by Danielle Solian, MD Reason for Visit   Additional Documentation  Vitals: BP 126/80 (BP Location: Left Arm, Patient Position: Sitting, Cuff Size: Normal)   Pulse 72   Temp 97.8 F (36.6 C) (Temporal)   Ht '5\' 3"'$  (1.6 m)   Wt 147 lb (66.7 kg)   SpO2 96%   BMI 26.04 kg/m   BSA 1.72 m      More Vitals  Flowsheets: Asthma Control Test,   Extended Vitals,   NEWS,   MEWS Score,   Anthropometrics,   Method of Visit  Encounter Info: Billing Info,   History,   Allergies,   Detailed Report   All Notes   Progress Notes by Danielle Geralds, MD at 03/07/2020 11:00 AM  Author: Spero Geralds, MD Author Type: Physician Filed: 03/08/2020  9:50 AM  Note Status: Signed Cosign: Cosign Not Required Encounter Date: 03/07/2020  Editor: Danielle Geralds, MD (Physician)                                                                                                                             Danielle Hayes                                                [id number]                                          02/22/1950   Primary Care Physician:Avva, Steva Ready, MD Date of Appointment: 03/07/2020 Established Patient Visit   Chief complaint:       Chief Complaint  Patient presents with   Follow-up      Get pft results.        HPI: Danielle Hayes is a 72 y.o. woman with mild intermittent asthma and rhinitis.    Interval Updates: Had worsening rhinitis symptoms which seem improved now after having flonase and cetirizine. Here for follow up after PFTs.    She had covid infection in Danielle Hayes 2020. She did have recurrent bronchitis as a kid.    Current  Regimen: albuterol prn Asthma Triggers: mild Exacerbations in the last year: none History of hospitalization or intubation: none Allergy Testing: never had GERD: denies Allergic Rhinitis: yes controlled ACT:  Asthma Control Test ACT Total Score  03/07/2020 23    FeNO: 20 ppb on 3/7   I have reviewed the patient's family social and past medical history and updated as appropriate.  Past Medical History:  Diagnosis Date   Anxiety     Arthritis     Bronchitis     C. difficile diarrhea 10/2014    states was related to antibiotic-? cefdinir   Complication of anesthesia      has big time phobia from nausea vomting last time got sick was at 72 years old    Dysrhythmia      PVC's   Ear infection 10/2015   Hyperlipidemia     Hypertension     PONV (postoperative nausea and vomiting)             Past Surgical History:  Procedure Laterality Date   KNEE ARTHROSCOPY        right and left   KNEE ARTHROSCOPY        right, left   KNEE CLOSED REDUCTION Left 08/09/2014    Procedure: LEFT KNEE CLOSED MANIPULATION ;  Surgeon: Danielle Arabian, MD;  Location: [facility];  Service: Orthopedics;  Laterality: Left;   LEEP       TONSILLECTOMY       TOTAL KNEE ARTHROPLASTY Left 06/28/2014    Procedure: LEFT TOTAL KNEE ARTHROPLASTY;  Surgeon: Danielle Arabian, MD;  Location: [facility];  Service: Orthopedics;  Laterality: Left;   TOTAL KNEE ARTHROPLASTY Right 10/17/2015    Procedure: RIGHT TOTAL KNEE ARTHROPLASTY;  Surgeon: Danielle Arabian, MD;  Location: [facility];  Service: Orthopedics;  Laterality: Right;           Family History  Problem Relation Age of Onset   Heart disease Mother     Colon cancer Neg Hx     Pancreatic cancer Neg Hx     Rectal cancer Neg Hx     Stomach cancer Neg Hx        Social History         Occupational History   Occupation: retired  Tobacco Use   Smoking status: Never Smoker   Smokeless tobacco: Never Used  Scientific laboratory technician Use: Never used  Substance and Sexual  Activity   Alcohol use: Yes      Alcohol/week: 6.0 standard drinks      Types: 6 Glasses of wine per week      Comment: weekly   Drug use: No   Sexual activity: Not on file        Physical Exam: Blood pressure 126/80, pulse 72, temperature 97.8 F (36.6 C), temperature source Temporal, height '5\' 3"'$  (1.6 m), weight 147 lb (66.7 kg), SpO2 96 %.   Gen: CV: Resp:   Data Reviewed:   PFTs: Spirometry done 3/7 personally reviewed,  shows no airflow limitation     Labs:   Immunization status:     Immunization History  Administered Date(s) Administered   Influenza, High Dose Seasonal PF 10/30/2017, 10/16/2018   Influenza,inj,Quad PF,6+ Mos 10/02/2019   PFIZER(Purple Top)SARS-COV-2 Vaccination 04/28/2019, 05/19/2019, 12/09/2019   Zoster 04/03/2012   Zoster Recombinat (Shingrix) 12/21/2017, 06/18/2018      Assessment:  Mild intermittent asthma Allergic rhinitis     Plan/Recommendations:   Continue taking flonase, cetirizine for the sore throat. Can use warm salt water gargles.  Continue albuterol as needed or asthma        Was appointment offered to patient (explain)?  Scheduled to see Danielle Hayes on 02/01/22 at 11:30 am   Reason for call: Deep cough since 01/27/22, started feeling bad.  She recently travelled on a plane and feels like this is where she got these  germs.  Coughing up mucous, but it does not come out.  She is getting clear mucous from her nose.  Denies fever, body aches, chills.  Tested for covid today and it was negative.  Has a lot of chest congestion.  Started mucinex DM yesterday.  Has an albuterol inhaler, which has expired.  She has Flonase as well.    No availability with any of the Nps until 2/8.  Advised I would check with one of our providers and see if I would get her seen Thursday morning and then I would call her back once I receive an answer.  Danielle Hayes verified it was ok to use 11:30 am slot on 02/01/22 for acute visit.  I called and spoke with  patient, provided time of appointment and advised to arrive at 11:15 am for check in and that she will need to wear a mask in the office.  She verbalized understanding.  Nothing further needed.    (examples of things to ask: : When did symptoms start? Fever? Cough? Productive? Color to sputum? More sputum than usual? Wheezing? Have you needed increased oxygen? Are you taking your respiratory medications? What over the counter measures have you tried?)  Allergies  Allergen Reactions   Cefdinir Other (See Comments)    c diff   Chlorhexidine Itching    Itchy and rash   Hydrocodone Nausea Only   Oxycodone Other (See Comments)    hallucinations   Synvisc [Hylan G-F 20] Swelling    Red, could not walk , rooster injections into knee   Xarelto [Rivaroxaban] Hives    Immunization History  Administered Date(s) Administered   Influenza, High Dose Seasonal PF 10/30/2017, 10/16/2018   Influenza,inj,Quad PF,6+ Mos 10/02/2019   PFIZER(Purple Top)SARS-COV-2 Vaccination 04/28/2019, 05/19/2019, 12/09/2019   Zoster Recombinat (Shingrix) 12/21/2017, 06/18/2018   Zoster, Live 04/03/2012

## 2022-01-31 DIAGNOSIS — R0981 Nasal congestion: Secondary | ICD-10-CM | POA: Diagnosis not present

## 2022-01-31 DIAGNOSIS — J40 Bronchitis, not specified as acute or chronic: Secondary | ICD-10-CM | POA: Diagnosis not present

## 2022-01-31 DIAGNOSIS — Z1152 Encounter for screening for COVID-19: Secondary | ICD-10-CM | POA: Diagnosis not present

## 2022-01-31 DIAGNOSIS — R638 Other symptoms and signs concerning food and fluid intake: Secondary | ICD-10-CM | POA: Diagnosis not present

## 2022-01-31 DIAGNOSIS — I10 Essential (primary) hypertension: Secondary | ICD-10-CM | POA: Diagnosis not present

## 2022-01-31 DIAGNOSIS — R5383 Other fatigue: Secondary | ICD-10-CM | POA: Diagnosis not present

## 2022-01-31 DIAGNOSIS — R051 Acute cough: Secondary | ICD-10-CM | POA: Diagnosis not present

## 2022-01-31 DIAGNOSIS — I480 Paroxysmal atrial fibrillation: Secondary | ICD-10-CM | POA: Diagnosis not present

## 2022-01-31 DIAGNOSIS — J101 Influenza due to other identified influenza virus with other respiratory manifestations: Secondary | ICD-10-CM | POA: Diagnosis not present

## 2022-02-01 ENCOUNTER — Ambulatory Visit: Payer: Medicare HMO | Admitting: Internal Medicine

## 2022-02-05 ENCOUNTER — Ambulatory Visit
Admission: RE | Admit: 2022-02-05 | Discharge: 2022-02-05 | Disposition: A | Discharge status: Home / Self Care | Payer: Medicare HMO | Source: Ambulatory Visit | Attending: Internal Medicine | Admitting: Internal Medicine

## 2022-02-05 DIAGNOSIS — Z1231 Encounter for screening mammogram for malignant neoplasm of breast: Secondary | ICD-10-CM

## 2022-02-12 DIAGNOSIS — L80 Vitiligo: Secondary | ICD-10-CM | POA: Diagnosis not present

## 2022-02-12 DIAGNOSIS — D2262 Melanocytic nevi of left upper limb, including shoulder: Secondary | ICD-10-CM | POA: Diagnosis not present

## 2022-02-12 DIAGNOSIS — L821 Other seborrheic keratosis: Secondary | ICD-10-CM | POA: Diagnosis not present

## 2022-02-12 DIAGNOSIS — L570 Actinic keratosis: Secondary | ICD-10-CM | POA: Diagnosis not present

## 2022-02-12 DIAGNOSIS — D0462 Carcinoma in situ of skin of left upper limb, including shoulder: Secondary | ICD-10-CM | POA: Diagnosis not present

## 2022-02-15 ENCOUNTER — Other Ambulatory Visit: Payer: Self-pay | Admitting: Internal Medicine

## 2022-02-20 ENCOUNTER — Encounter: Payer: Self-pay | Admitting: Internal Medicine

## 2022-02-20 DIAGNOSIS — K589 Irritable bowel syndrome without diarrhea: Secondary | ICD-10-CM | POA: Diagnosis not present

## 2022-02-20 DIAGNOSIS — L80 Vitiligo: Secondary | ICD-10-CM | POA: Diagnosis not present

## 2022-02-20 DIAGNOSIS — J45909 Unspecified asthma, uncomplicated: Secondary | ICD-10-CM | POA: Diagnosis not present

## 2022-02-20 DIAGNOSIS — I480 Paroxysmal atrial fibrillation: Secondary | ICD-10-CM | POA: Diagnosis not present

## 2022-02-20 DIAGNOSIS — E785 Hyperlipidemia, unspecified: Secondary | ICD-10-CM | POA: Diagnosis not present

## 2022-02-20 DIAGNOSIS — I10 Essential (primary) hypertension: Secondary | ICD-10-CM | POA: Diagnosis not present

## 2022-02-20 DIAGNOSIS — M170 Bilateral primary osteoarthritis of knee: Secondary | ICD-10-CM | POA: Diagnosis not present

## 2022-02-20 DIAGNOSIS — R1310 Dysphagia, unspecified: Secondary | ICD-10-CM | POA: Diagnosis not present

## 2022-02-20 DIAGNOSIS — K219 Gastro-esophageal reflux disease without esophagitis: Secondary | ICD-10-CM | POA: Diagnosis not present

## 2022-02-26 DIAGNOSIS — J45909 Unspecified asthma, uncomplicated: Secondary | ICD-10-CM | POA: Diagnosis not present

## 2022-02-26 DIAGNOSIS — R1310 Dysphagia, unspecified: Secondary | ICD-10-CM | POA: Diagnosis not present

## 2022-02-26 DIAGNOSIS — K219 Gastro-esophageal reflux disease without esophagitis: Secondary | ICD-10-CM | POA: Diagnosis not present

## 2022-02-28 ENCOUNTER — Ambulatory Visit (AMBULATORY_SURGERY_CENTER): Payer: Medicare HMO | Admitting: Internal Medicine

## 2022-02-28 ENCOUNTER — Encounter: Payer: Self-pay | Admitting: Internal Medicine

## 2022-02-28 VITALS — BP 141/73 | HR 57 | Temp 98.6°F | Resp 18 | Ht 62.0 in | Wt 148.0 lb

## 2022-02-28 DIAGNOSIS — D129 Benign neoplasm of anus and anal canal: Secondary | ICD-10-CM

## 2022-02-28 DIAGNOSIS — R1310 Dysphagia, unspecified: Secondary | ICD-10-CM | POA: Diagnosis not present

## 2022-02-28 DIAGNOSIS — R197 Diarrhea, unspecified: Secondary | ICD-10-CM

## 2022-02-28 DIAGNOSIS — Z1211 Encounter for screening for malignant neoplasm of colon: Secondary | ICD-10-CM | POA: Diagnosis not present

## 2022-02-28 DIAGNOSIS — D128 Benign neoplasm of rectum: Secondary | ICD-10-CM

## 2022-02-28 DIAGNOSIS — K222 Esophageal obstruction: Secondary | ICD-10-CM

## 2022-02-28 MED ORDER — SODIUM CHLORIDE 0.9 % IV SOLN: 500.0000 mL | Freq: Once | INTRAVENOUS | Status: DC

## 2022-02-28 NOTE — Progress Notes (Signed)
HISTORY OF PRESENT ILLNESS:   Danielle Hayes is a 72 y.o. female with multiple medical problems as listed below including history of atrial fibrillation for which she is on Eliquis (ejection fraction 68% no history of TIA or CVA).  Patient presents today regarding dysphagia and worsening diarrhea.  I last saw the patient in July 2015 for routine screening colonoscopy.  Examination was normal.  She was last seen in this office by the GI physician assistant February 2020 regarding loose malodorous stools.  See that dictation for details.  Blood work was unremarkable as were stool studies.  Testing for celiac disease was negative.  She has not been seen since.   First, patient reports several year history of difficulty swallowing.  She describes multiple things including globus type sensation, hiccups with meals, and a sensation of slow passage of Hayes solids and liquids with equal frequency.  No heartburn.  No weight loss.  She was placed on PPI empirically by her PCP.  Pantoprazole 40 mg daily over the past 6 weeks.  This has not changed symptoms.  She tells me that she has some sort of issue with swallowing at least twice per day.  No prior upper endoscopy.   Next, she describes herself as having IBS since her 56s.  However, over the past 8 months she has had more frequent issues with loose stools.  At times significant urgency.  No incontinence, though she is careful when going out in public.  She tells me that she can upcoming ski trip to [facility].  She wonders if there is something she might do for diarrhea until this workup can be completed.  She denies rectal bleeding.  Of interest, she tells me that her brother was recently diagnosed with celiac disease.   In by her PCP in the office November 03, 2021.  Reviewed.  Most recent blood work from October 27, 2021 shows unremarkable CBC with hemoglobin 15.9.  Total cholesterol 190.  Normal liver tests and renal function.  Albumin 3.8.    REVIEW OF SYSTEMS:   All non-GI ROS negative unless otherwise stated in HPI except for night sweats, irregular heartbeat       Past Medical History:  Diagnosis Date   Anxiety     Arthritis     Asthma     Atrial fibrillation (HCC)     Bronchitis     C. difficile diarrhea 10/2014    states was related to antibiotic-? cefdinir   Complication of anesthesia      has big time phobia from nausea vomting last time got sick was at 72 years old    Dysrhythmia      PVC's   Ear infection 10/2015   Hyperlipidemia     Hypertension     PONV (postoperative nausea and vomiting)             Past Surgical History:  Procedure Laterality Date   KNEE ARTHROSCOPY        right and left   KNEE ARTHROSCOPY        right, left   KNEE CLOSED REDUCTION Left 08/09/2014    Procedure: LEFT KNEE CLOSED MANIPULATION ;  Surgeon: Gaynelle Arabian, MD;  Location: [facility];  Service: Orthopedics;  Laterality: Left;   LEEP       TONSILLECTOMY       TOTAL KNEE ARTHROPLASTY Left 06/28/2014    Procedure: LEFT TOTAL KNEE ARTHROPLASTY;  Surgeon: Gaynelle Arabian, MD;  Location: [facility];  Service:  Orthopedics;  Laterality: Left;   TOTAL KNEE ARTHROPLASTY Right 10/17/2015    Procedure: RIGHT TOTAL KNEE ARTHROPLASTY;  Surgeon: Gaynelle Arabian, MD;  [facility]: [facility];  Service: Orthopedics;  Laterality: Right;      Social History Danielle Hayes  reports that she has never smoked. She has never used smokeless tobacco. She reports current alcohol use of about 6.0 standard drinks of alcohol per week. She reports that she does not use drugs.   family history includes Alzheimer's disease in her father; Breast cancer in her maternal aunt; Celiac disease in her brother; Colon polyps in her brother; Heart disease in her mother.        Allergies  Allergen Reactions   Cefdinir Other (See Comments)      c diff   Chlorhexidine Itching      Itchy and rash   Hydrocodone Nausea Only   Oxycodone Other (See Comments)       hallucinations   Synvisc [Hylan G-F 20] Swelling      Red, could not walk , rooster injections into knee   Xarelto [Rivaroxaban] Hives          PHYSICAL EXAMINATION: Vital signs: BP (!) 140/100   Pulse 60   Ht '5\' 2"'$  (1.575 m)   Wt 148 lb (67.1 kg)   BMI 27.07 kg/m   Constitutional: generally well-appearing, no acute distress Psychiatric: alert and oriented x3, cooperative Eyes: extraocular movements intact, anicteric, conjunctiva pink Mouth: oral pharynx moist, no lesions Neck: supple no lymphadenopathy Cardiovascular: heart regular rate and rhythm, no murmur Lungs: clear to auscultation bilaterally Abdomen: soft, nontender, nondistended, no obvious ascites, no peritoneal signs, normal bowel sounds, no organomegaly Rectal: Deferred until colonoscopy Extremities: no clubbing, cyanosis, or lower extremity edema bilaterally Skin: no lesions on visible extremities Neuro: No focal deficits.  Cranial nerves intact   ASSESSMENT:   1.  Somewhat vague dysphagia and hiccups as described.  No response to PPI.  Etiology unclear.  Rule out stricture.  Rule out motility disorder. 2.  Worsening diarrhea.  Rule out microscopic colitis.  Rule out celiac disease (given brothers history, despite negative serologies previously). 3.  History of atrial fibrillation on Eliquis.  Stable.  Regular rate and rhythm today. 4.  Other general medical problems, stable.  Under the care of Dr. Dagmar Hait     PLAN:   1.  Okay to stop Protonix (the patient required) as this has not helped over the past 6 weeks. 2.  Schedule upper endoscopy with possible esophageal dilation.  The patient is high risk given her chronic anticoagulation state.The nature of the procedure, as well as the risks, benefits, and alternatives were carefully and thoroughly reviewed with the patient. Ample time for discussion and questions allowed. The patient understood, was satisfied, and agreed to proceed. 3.  Schedule colonoscopy with  biopsies to rule out microscopic colitis and provide follow-up colon cancer screening.  Patient is high risk as above.The nature of the procedure, as well as the risks, benefits, and alternatives were carefully and thoroughly reviewed with the patient. Ample time for discussion and questions allowed. The patient understood, was satisfied, and agreed to proceed. 4.  Okay to use Imodium as needed for diarrhea 5.  Hold Eliquis 2 days prior to procedure.  Would anticipate resumption immediately post procedure unless significant therapeutics required.  The risks and benefits of interrupting any coagulation therapy for short period of time discussed.  She understands.   Recent history and physical examination as outlined above.  No substantial interval change.  Now for procedures.  Has held Eliquis for 2 days.

## 2022-02-28 NOTE — Progress Notes (Signed)
Called to room to assist during endoscopic procedure.  Patient ID and intended procedure confirmed with present staff. Received instructions for my participation in the procedure from the performing physician.  

## 2022-02-28 NOTE — Op Note (Signed)
[facility] Patient Name: Sahara Sayler Procedure Date: 02/28/2022 8:17 AM MRN: [id number] Endoscopist: Docia Chuck. Henrene Pastor , MD, [id number] Age: 72 Referring MD:  Date of Birth: [birth year] Gender: Female Account #: [id number] Procedure:                Colonoscopy with cold snare polypectomy x 1; with                            random biopsies Indications:              Screening for colorectal malignant neoplasm;                            incidental diarrhea. Previous examination 2015 was                            negative for neoplasia Medicines:                Monitored Anesthesia Care Procedure:                Pre-Anesthesia Assessment:                           - Prior to the procedure, a History and Physical                            was performed, and patient medications and                            allergies were reviewed. The patient's tolerance of                            previous anesthesia was also reviewed. The risks                            and benefits of the procedure and the sedation                            options and risks were discussed with the patient.                            All questions were answered, and informed consent                            was obtained. Prior Anticoagulants: The patient has                            taken Eliquis (apixaban), last dose was 2 days                            prior to procedure. ASA Grade Assessment: III - A                            patient with severe systemic disease. After  reviewing the risks and benefits, the patient was                            deemed in satisfactory condition to undergo the                            procedure.                           After obtaining informed consent, the colonoscope                            was passed under direct vision. Throughout the                            procedure, the patient's blood pressure, pulse, and                             oxygen saturations were monitored continuously. The                            CF [id number] was introduced through the anus                            and advanced to the the cecum, identified by                            appendiceal orifice and ileocecal valve. The                            ileocecal valve, appendiceal orifice, and rectum                            were photographed. The quality of the bowel                            preparation was excellent. The colonoscopy was                            performed without difficulty. The patient tolerated                            the procedure well. The bowel preparation used was                            SUPREP via split dose instruction. Scope In: 8:47:50 AM Scope Out: 8:59:11 AM Scope Withdrawal Time: 0 hours 9 minutes 13 seconds  Total Procedure Duration: 0 hours 11 minutes 21 seconds  Findings:                 A 1 mm polyp was found in the rectum. The polyp was                            removed with a cold snare. Resection and retrieval  were complete.                           The exam was otherwise without abnormality on                            direct and retroflexion views.                           Biopsies for histology were taken with a cold                            forceps from the entire colon for evaluation of                            microscopic colitis. Complications:            No immediate complications. Estimated blood loss:                            None. Estimated Blood Loss:     Estimated blood loss: none. Impression:               - One 1 mm polyp in the rectum, removed with a cold                            snare. Resected and retrieved.                           - The examination was otherwise normal on direct                            and retroflexion views.                           - Biopsies were taken with a cold forceps from the                             entire colon for evaluation of microscopic colitis. Recommendation:           - Repeat colonoscopy in 7-10 years for surveillance.                           - Resume Eliquis (apixaban) today at prior dose.                           - Patient has a contact number available for                            emergencies. The signs and symptoms of potential                            delayed complications were discussed with the                            patient. Return to normal activities  tomorrow.                            Written discharge instructions were provided to the                            patient.                           - Resume previous diet.                           - Continue present medications.                           - Await pathology results. Docia Chuck. Henrene Pastor, MD 02/28/2022 9:14:25 AM This report has been signed electronically.

## 2022-02-28 NOTE — Op Note (Signed)
[facility] Patient Name: Danielle Hayes Procedure Date: 02/28/2022 8:17 AM MRN: [id number] Endoscopist: Docia Chuck. Henrene Pastor , MD, [id number] Age: 72 Referring MD:  Date of Birth: 07/18/1950 Gender: Female Account #: [id number] Procedure:                Upper GI endoscopy with biopsies; Maloney dilation                            of the esophagus. 27 French Indications:              Dysphagia, Diarrhea Medicines:                Monitored Anesthesia Care Procedure:                Pre-Anesthesia Assessment:                           - Prior to the procedure, a History and Physical                            was performed, and patient medications and                            allergies were reviewed. The patient's tolerance of                            previous anesthesia was also reviewed. The risks                            and benefits of the procedure and the sedation                            options and risks were discussed with the patient.                            All questions were answered, and informed consent                            was obtained. Prior Anticoagulants: The patient has                            taken no anticoagulant or antiplatelet agents. ASA                            Grade Assessment: III - A patient with severe                            systemic disease. After reviewing the risks and                            benefits, the patient was deemed in satisfactory                            condition to undergo the procedure.  After obtaining informed consent, the endoscope was                            passed under direct vision. Throughout the                            procedure, the patient's blood pressure, pulse, and                            oxygen saturations were monitored continuously. The                            Olympus Scope [id number] was introduced through the                            mouth, and advanced to  the second part of duodenum.                            The upper GI endoscopy was accomplished without                            difficulty. The patient tolerated the procedure                            well. Scope In: Scope Out: Findings:                 One benign-appearing, intrinsic moderate stenosis                            was found 38 cm from the incisors. This stenosis                            measured 1.5 cm (inner diameter). The scope was                            withdrawn. Dilation was performed with a Maloney                            dilator with no resistance at 64 Fr.                           The exam of the esophagus was otherwise normal.                           The stomach was normal.                           The examined duodenum was normal. Biopsies for                            histology were taken with a cold forceps for                            evaluation of celiac  disease.                           The cardia and gastric fundus were normal on                            retroflexion. Complications:            No immediate complications. Estimated Blood Loss:     Estimated blood loss: none. Impression:               - Benign-appearing esophageal stenosis. Dilated.                           - Normal stomach.                           - Normal examined duodenum. Biopsied. Recommendation:           - Patient has a contact number available for                            emergencies. The signs and symptoms of potential                            delayed complications were discussed with the                            patient. Return to normal activities tomorrow.                            Written discharge instructions were provided to the                            patient.                           - Resume previous diet.                           - Continue present medications.                           - Resume Eliquis today                            - Await pathology results.                           - Office follow-up with Dr. Henrene Pastor in 4 to 6 weeks Docia Chuck. Henrene Pastor, MD 02/28/2022 9:17:28 AM This report has been signed electronically.

## 2022-02-28 NOTE — Progress Notes (Signed)
VS completed by CW.   Pt's states no medical or surgical changes since previsit or office visit.  

## 2022-02-28 NOTE — Progress Notes (Signed)
Sedate, gd SR, tolerated procedure well, VSS, report to RN 

## 2022-02-28 NOTE — Patient Instructions (Addendum)
Recommendation:  Patient has a contact number available for                            emergencies. The signs and symptoms of potential                            delayed complications were discussed with the                            patient. Return to normal activities tomorrow.                            Written discharge instructions were provided to the                            patient.                           - Resume previous diet.                           - Continue present medications.                           - Resume Eliquis today                           - Await pathology results.                           - Office follow-up with Dr. Henrene Pastor in 4 to 6 weeks  Handouts on stricture and polyps given.                                                           YOU HAD AN ENDOSCOPIC PROCEDURE TODAY AT [facility]:   Refer to the procedure report that was given to you for any specific questions about what was found during the examination.  If the procedure report does not answer your questions, please call your gastroenterologist to clarify.  If you requested that your care partner not be given the details of your procedure findings, then the procedure report has been included in a sealed envelope for you to review at your convenience later.  YOU SHOULD EXPECT: Some feelings of bloating in the abdomen. Passage of more gas than usual.  Walking can help get rid of the air that was put into your GI tract during the procedure and reduce the bloating. If you had a lower endoscopy (such as a colonoscopy or flexible sigmoidoscopy) you may notice spotting of blood in your stool or on the toilet paper. If you underwent a bowel prep for your procedure, you may not have a normal bowel movement for a few days.  Please Note:  You might notice some irritation and congestion in your nose or some drainage.  This is from the oxygen used during your procedure.  There is no need for  concern and it should clear up in a day or so.  SYMPTOMS TO REPORT IMMEDIATELY:  Following lower endoscopy (colonoscopy or flexible sigmoidoscopy):  Excessive amounts of blood in the stool  Significant tenderness or worsening of abdominal pains  Swelling of the abdomen that is new, acute  Fever of 100F or higher  Following upper endoscopy (EGD)  Vomiting of blood or coffee ground material  New chest pain or pain under the shoulder blades  Painful or persistently difficult swallowing  New shortness of breath  Fever of 100F or higher  Black, tarry-looking stools  For urgent or emergent issues, a gastroenterologist can be reached at any hour by calling [phone]. Do not use MyChart messaging for urgent concerns.    DIET:  We do recommend a small meal at first, but then you may proceed to your regular diet.  Drink plenty of fluids but you should avoid alcoholic beverages for 24 hours.  ACTIVITY:  You should plan to take it easy for the rest of today and you should NOT DRIVE or use heavy machinery until tomorrow (because of the sedation medicines used during the test).    FOLLOW UP: Our staff will call the number listed on your records the next business day following your procedure.  We will call around 7:15- 8:00 am to check on you and address any questions or concerns that you may have regarding the information given to you following your procedure. If we do not reach you, we will leave a message.     If any biopsies were taken you will be contacted by phone or by letter within the next 1-3 weeks.  Please call us at [phone] if you have not heard about the biopsies in 3 weeks.    SIGNATURES/CONFIDENTIALITY: You and/or your care partner have signed paperwork which will be entered into your electronic medical record.  These signatures attest to the fact that that the information above on your After Visit Summary has been reviewed and is understood.  Full responsibility of the  confidentiality of this discharge information lies with you and/or your care-partner.

## 2022-03-01 ENCOUNTER — Telehealth: Payer: Self-pay

## 2022-03-01 ENCOUNTER — Telehealth: Payer: Self-pay | Admitting: *Deleted

## 2022-03-01 NOTE — Telephone Encounter (Signed)
No answer, left message to call if having any issues or concerns, B.Edithe Dobbin RN 

## 2022-03-01 NOTE — Telephone Encounter (Signed)
PT is returning call. Has a few questions. Requesting call back. Please advise.

## 2022-03-01 NOTE — Telephone Encounter (Signed)
Returned pt's call she states she is still seeing a small amount of bright red blood on the toilet tissue when she wiped this morning. States when she got home yesterday from procedure she had blood in her underwear that looked like she had started her menstrual cycle again and she noticed some drops of blood in the toilet a few times yesterday. She is concerned that she is still seeing blood this morning and she states it is a small amount but is also more than she would like to see. No complications noted on procedure report. Pt is on Eliquis and she did resume it yesterday per MD instructions. RN instructed pt that a small amount of blood is not abnormal but if the amount increases then we need to know about it immediately. Instructed pt that RN will make MD aware of her concerns and if there any further instructions RN will call her back. Pt verbalized understanding.

## 2022-03-01 NOTE — Telephone Encounter (Signed)
Not unusual to have a little bleeding from the distal polypectomy site.  Should resolve with time.  Reassurance.  Observe. Thanks for the update

## 2022-03-05 ENCOUNTER — Encounter: Payer: Self-pay | Admitting: Internal Medicine

## 2022-03-28 ENCOUNTER — Ambulatory Visit: Payer: Medicare HMO | Admitting: Internal Medicine

## 2022-03-28 VITALS — BP 161/87 | HR 57 | Resp 16 | Ht 62.0 in | Wt 146.2 lb

## 2022-03-28 DIAGNOSIS — I10 Essential (primary) hypertension: Secondary | ICD-10-CM

## 2022-03-28 DIAGNOSIS — E782 Mixed hyperlipidemia: Secondary | ICD-10-CM

## 2022-03-28 DIAGNOSIS — I480 Paroxysmal atrial fibrillation: Secondary | ICD-10-CM

## 2022-03-28 NOTE — Progress Notes (Signed)
Primary Physician/Referring:  Prince Solian, MD  Patient ID: Danielle Hayes, female    DOB: 1950/08/13, 72 y.o.   MRN: [id number]  Chief Complaint  Patient presents with   Atrial Fibrillation   Follow-up    HPI:    Danielle Hayes  is a 72 y.o. female with hypertension and paroxysmal atrial fibrillation who is here for a follow-up visit. She is tolerating Eliquis and Toprol without issues or side effects. She admits she still gets minimal palpitations but this does not limit her at all. She works out vigorously 6 days per week and is not limited by palpitations. She recently went cross country skiing without any afib symptoms. Denies chest pain, shortness of breath, diaphoresis, syncope, edema, PND, orthopnea. Overall, patient has been feeling really well.    Past Medical History:  Diagnosis Date   Anxiety    Arthritis    Asthma    Atrial fibrillation (HCC)    Bronchitis    C. difficile diarrhea 10/2014   states was related to antibiotic-? cefdinir   Complication of anesthesia    has big time phobia from nausea vomting last time got sick was at 72 years old    Dysrhythmia    PVC's   Ear infection 10/2015   Hyperlipidemia    Hypertension    PONV (postoperative nausea and vomiting)    Past Surgical History:  Procedure Laterality Date   COLONOSCOPY     KNEE ARTHROSCOPY     right and left   KNEE ARTHROSCOPY     right, left   KNEE CLOSED REDUCTION Left 08/09/2014   Procedure: LEFT KNEE CLOSED MANIPULATION ;  Surgeon: Gaynelle Arabian, MD;  Location: [facility];  Service: Orthopedics;  Laterality: Left;   LEEP     TONSILLECTOMY     TOTAL KNEE ARTHROPLASTY Left 06/28/2014   Procedure: LEFT TOTAL KNEE ARTHROPLASTY;  Surgeon: Gaynelle Arabian, MD;  Location: [facility];  Service: Orthopedics;  Laterality: Left;   TOTAL KNEE ARTHROPLASTY Right 10/17/2015   Procedure: RIGHT TOTAL KNEE ARTHROPLASTY;  Surgeon: Gaynelle Arabian, MD;  Location: [facility];  Service: Orthopedics;   Laterality: Right;   Family History  Problem Relation Age of Onset   Heart disease Mother    Alzheimer's disease Father    Celiac disease Brother    Colon polyps Brother    Breast cancer Maternal Aunt    Colon cancer Neg Hx    Pancreatic cancer Neg Hx    Rectal cancer Neg Hx    Stomach cancer Neg Hx     Social History   Tobacco Use   Smoking status: Never   Smokeless tobacco: Never  Substance Use Topics   Alcohol use: Yes    Alcohol/week: 6.0 standard drinks of alcohol    Types: 6 Glasses of wine per week    Comment: weekly   Marital Status: Married  ROS  Review of Systems  Constitutional: Negative.  Cardiovascular:  Positive for irregular heartbeat. Negative for claudication, cyanosis, near-syncope, palpitations and syncope.  Respiratory: Negative.    Gastrointestinal: Negative.   Neurological: Negative.   All other systems reviewed and are negative.  Objective  Blood pressure (!) 161/87, pulse (!) 57, resp. rate 16, height 5\' 2"  (1.575 m), weight 146 lb 3.2 oz (66.3 kg), SpO2 97 %. Body mass index is 26.74 kg/m.     03/28/2022    9:46 AM 02/28/2022    9:32 AM 02/28/2022    9:22 AM  Vitals with BMI  Height 5\' 2"     Weight 146 lbs 3 oz    BMI Q000111Q    Systolic Q000111Q Q000111Q [id number]  Diastolic 87 73 73  Pulse 57 57 59     Physical Exam Vitals (repeat BP by me 136/80) and nursing note reviewed.  Constitutional:      General: She is not in acute distress.    Appearance: Normal appearance.  HENT:     Head: Normocephalic and atraumatic.  Neck:     Vascular: No carotid bruit.  Cardiovascular:     Rate and Rhythm: Normal rate and regular rhythm.     Pulses: Normal pulses.     Heart sounds: Normal heart sounds. No murmur heard.    No friction rub. No gallop.  Pulmonary:     Effort: Pulmonary effort is normal.     Breath sounds: Normal breath sounds.  Abdominal:     General: Abdomen is flat. Bowel sounds are normal.     Palpations: Abdomen is soft.  Musculoskeletal:      Right lower leg: No edema.     Left lower leg: No edema.  Skin:    General: Skin is warm and dry.  Neurological:     Mental Status: She is alert.    Medications and allergies   Allergies  Allergen Reactions   Cefdinir Other (See Comments)    c diff   Chlorhexidine Itching    Itchy and rash, patient says this is not an allergy 03/28/22   Hydrocodone Nausea Only   Oxycodone Other (See Comments)    hallucinations   Synvisc [Hylan G-F 20] Swelling    Red, could not walk , rooster injections into knee   Xarelto [Rivaroxaban] Hives     Medication list after today's encounter   Current Outpatient Medications:    albuterol (VENTOLIN HFA) 108 (90 Base) MCG/ACT inhaler, Inhale 1-2 puffs into the lungs every 4 (four) hours as needed for wheezing or shortness of breath., Disp: , Rfl:    apixaban (ELIQUIS) 5 MG TABS tablet, Take 1 tablet (5 mg total) by mouth 2 (two) times daily., Disp: 60 tablet, Rfl: 2   atorvastatin (LIPITOR) 40 MG tablet, Take 40 mg by mouth every morning. , Disp: , Rfl:    buPROPion (WELLBUTRIN XL) 150 MG 24 hr tablet, Take 150 mg by mouth daily., Disp: , Rfl:    cholecalciferol (VITAMIN D3) 25 MCG (1000 UNIT) tablet, Take 2,000 Units by mouth daily., Disp: , Rfl:    escitalopram (LEXAPRO) 5 MG tablet, TAKE 1 TABLET BY MOUTH EVERY DAY FOR 30 DAYS for 90, Disp: , Rfl:    fluticasone (FLONASE) 50 MCG/ACT nasal spray, 2 sprays each nostril Nasal Once a day for 30 days, Disp: , Rfl:    Glucosamine HCl 1000 MG TABS, Take 1,000 mg by mouth in the morning and at bedtime., Disp: , Rfl:    metoprolol succinate (TOPROL-XL) 25 MG 24 hr tablet, TAKE 1 TABLET (25 MG TOTAL) BY MOUTH DAILY., Disp: 90 tablet, Rfl: 0   Multiple Vitamin (MULTI-VITAMIN DAILY PO), Multi Vitamin, Disp: , Rfl:    Omega 3 1200 MG CAPS, Take 1,200 mg by mouth daily., Disp: , Rfl:    OVER THE COUNTER MEDICATION, Take 1 tablet by mouth daily. Suprema dophilus (probiotic), Disp: , Rfl:    pantoprazole  (PROTONIX) 40 MG tablet, Take 40 mg by mouth daily., Disp: , Rfl:    temazepam (RESTORIL) 15 MG capsule, Take 15 mg by mouth at bedtime as  needed for sleep., Disp: , Rfl:    triamterene-hydrochlorothiazide (MAXZIDE-25) 37.5-25 MG per tablet, Take 0.5 tablets by mouth every morning., Disp: , Rfl:   Laboratory examination:   Lab Results  Component Value Date   NA 140 02/04/2020   K 3.5 02/04/2020   CO2 26 02/04/2020   GLUCOSE 113 (H) 02/04/2020   BUN 13 02/04/2020   CREATININE 0.76 02/04/2020   CALCIUM 8.7 (L) 02/04/2020   GFRNONAA >60 02/04/2020       Latest Ref Rng & Units 02/04/2020   10:20 PM 07/02/2019    4:00 AM 07/01/2019   10:40 AM  CMP  Glucose 70 - 99 mg/dL 113  99  115   BUN 8 - 23 mg/dL 13  15  17    Creatinine 0.44 - 1.00 mg/dL 0.76  0.50  0.63   Sodium 135 - 145 mmol/L 140  139  139   Potassium 3.5 - 5.1 mmol/L 3.5  3.5  3.8   Chloride 98 - 111 mmol/L 103  104  102   CO2 22 - 32 mmol/L 26  26  28    Calcium 8.9 - 10.3 mg/dL 8.7  8.2  8.9       Latest Ref Rng & Units 02/04/2020   10:20 PM 07/02/2019    4:00 AM 07/01/2019    5:32 PM  CBC  WBC 4.0 - 10.5 K/uL 5.8  7.2  5.4   Hemoglobin 12.0 - 15.0 g/dL 15.1  15.0  14.6   Hematocrit 36.0 - 46.0 % 42.8  42.7  42.0   Platelets 150 - 400 K/uL 236  173  203     Lipid Panel No results for input(s): "CHOL", "TRIG", "LDLCALC", "VLDL", "HDL", "CHOLHDL", "LDLDIRECT" in the last 8760 hours.  HEMOGLOBIN A1C No results found for: "HGBA1C", "MPG" TSH No results for input(s): "TSH" in the last 8760 hours.  External labs:     Radiology:    Cardiac Studies:   Patch Wear Time:  8 days and 15 hours (2023-08-30T12:07:25-0400 to 2023-09-12T10:35:01-398)   Monitor 1 Patient had a min HR of 47 bpm, max HR of 164 bpm, and avg HR of 71 bpm. Predominant underlying rhythm was Sinus Rhythm. First Degree AV Block was present. 6 Supraventricular Tachycardia runs occurred, the run with the fastest interval lasting 6 beats with a max  rate of 164 bpm, the longest lasting 10 beats with an avg rate of 113 bpm. Some episodes of Supraventricular Tachycardia may be possible Atrial Tachycardia with variable block. Isolated SVEs were rare (<1.0%), SVE Couplets were rare (<1.0%), and SVE Triplets were rare (<1.0%). Isolated VEs were occasional (1.0%, 1923), VE Couplets were rare (<1.0%, 33), and VE Triplets were rare (<1.0%, 1). Ventricular Bigeminy and Trigeminy were present.   Monitor 2 Patient had a min HR of 43 bpm, max HR of 133 bpm, and avg HR of 65 bpm. Predominant underlying rhythm was Sinus Rhythm. 12 Supraventricular Tachycardia runs occurred, the run with the fastest interval lasting 4 beats with a max rate of 133 bpm, the longest lasting 10 beats with an avg rate of 113 bpm. Isolated SVEs were occasional (1.2%, 7305), SVE Couplets were rare (<1.0%, 144), and SVE Triplets were rare (<1.0%, 26). Isolated VEs were rare (<1.0%), VE Couplets were rare (<1.0%), and no VE Triplets were present. Ventricular Trigeminy was present.    Echocardiogram 09/13/2021:  Normal LV systolic function with visual EF 60-65%. Left ventricle cavity  is normal in size. Normal left ventricular wall thickness.  Normal global  wall motion. Normal diastolic filling pattern, normal LAP. Calculated EF 68%.  Structurally normal tricuspid valve with trace regurgitation. No evidence  of pulmonary hypertension. no prior available for comparison. Normal echocardiogram.   Exercise nuclear stress test 08/16/21 Myocardial perfusion is normal. Overall LV systolic function is normal without regional wall motion abnormalities. Stress LV EF: 74%.  Low risk study. Normal ECG stress. The patient exercised for 5 minutes and 13 seconds of a Modified Bruce protocol, achieving approximately 7.05 METs and 101% MPHR. The heart rate response was normal. The blood pressure response was normal. No previous exam available for comparison.    EKG:   08/07/2021: NSR,  normal  EKG  08/30/21 NSR  03/28/2022: Sinus Bradycardia, rate 55 bpm. No ischemia.  Assessment     ICD-10-CM   1. PAF (paroxysmal atrial fibrillation) (HCC)  I48.0 EKG 12-Lead    2. Essential hypertension  I10     3. Mixed hyperlipidemia  E78.2         Orders Placed This Encounter  Procedures   EKG 12-Lead     No orders of the defined types were placed in this encounter.   Medications Discontinued During This Encounter  Medication Reason   estradiol (ESTRACE) 0.1 MG/GM vaginal cream    OVER THE COUNTER MEDICATION       Recommendations:   Jalayna Saelah Gniadek is a 71 y.o.  female with paroxysmal Afib, HLD, and HTN  Hyperlipidemia Continue Lipitor PCP following lipids   Hypertension BP always high in office, at home she is <130/80 When I recheck manually towards end of visit her BP is well controlled. Continue Toprol-XL, Maxzide Encourage low-sodium diet, less than 2000 mg daily.   Paroxysmal Atrial Fibrillation On Toprol-XL for rate control, it seems to be working very well for her Continue Eliquis 5mg  BID for stroke prevention as pt is CHA2DS2VASc of 3  Do not recommend adding any estrogen medication that interferes with Eliquis  Follow-up in 6 months or sooner if needed.    Floydene Flock, DO  03/28/2022, 10:29 AM Office: [phone] Pager: [phone]

## 2022-04-02 DIAGNOSIS — R69 Illness, unspecified: Secondary | ICD-10-CM | POA: Diagnosis not present

## 2022-04-26 DIAGNOSIS — N390 Urinary tract infection, site not specified: Secondary | ICD-10-CM | POA: Diagnosis not present

## 2022-04-26 DIAGNOSIS — R399 Unspecified symptoms and signs involving the genitourinary system: Secondary | ICD-10-CM | POA: Diagnosis not present

## 2022-05-08 ENCOUNTER — Other Ambulatory Visit: Payer: Self-pay | Admitting: Internal Medicine

## 2022-05-31 ENCOUNTER — Encounter: Payer: Self-pay | Admitting: Internal Medicine

## 2022-05-31 ENCOUNTER — Ambulatory Visit: Payer: Medicare HMO | Admitting: Internal Medicine

## 2022-05-31 VITALS — BP 112/70 | HR 63 | Ht 62.0 in | Wt 147.0 lb

## 2022-05-31 DIAGNOSIS — R1310 Dysphagia, unspecified: Secondary | ICD-10-CM | POA: Diagnosis not present

## 2022-05-31 DIAGNOSIS — K589 Irritable bowel syndrome without diarrhea: Secondary | ICD-10-CM | POA: Diagnosis not present

## 2022-05-31 MED ORDER — RIFAXIMIN 550 MG PO TABS: 550.0000 mg | ORAL_TABLET | Freq: Three times a day (TID) | ORAL | 0 refills | Status: DC

## 2022-05-31 NOTE — Patient Instructions (Signed)
We have sent the following medications to your pharmacy for you to pick up at your convenience:  Xifaxan   You have been scheduled for a Barium Esophogram at [facility] (1st floor of the hospital) on 06/08/2022 at 11:00am. Please arrive 15 minutes prior to your appointment for registration.  If you need to reschedule for any reason, please contact radiology at [phone] to do so. __________________________________________________________________ A barium swallow is an examination that concentrates on views of the esophagus. This tends to be a double contrast exam (barium and two liquids which, when combined, create a gas to distend the wall of the oesophagus) or single contrast (non-ionic iodine based). The study is usually tailored to your symptoms so a good history is essential. Attention is paid during the study to the form, structure and configuration of the esophagus, looking for functional disorders (such as aspiration, dysphagia, achalasia, motility and reflux) EXAMINATION You may be asked to change into a gown, depending on the type of swallow being performed. A radiologist and radiographer will perform the procedure. The radiologist will advise you of the type of contrast selected for your procedure and direct you during the exam. You will be asked to stand, sit or lie in several different positions and to hold a small amount of fluid in your mouth before being asked to swallow while the imaging is performed .In some instances you may be asked to swallow barium coated marshmallows to assess the motility of a solid food bolus. The exam can be recorded as a digital or video fluoroscopy procedure. POST PROCEDURE It will take 1-2 days for the barium to pass through your system. To facilitate this, it is important, unless otherwise directed, to increase your fluids for the next 24-48hrs and to resume your normal diet.  This test typically takes about 30 minutes to  perform.  _______________________________________________________  If your blood pressure at your visit was 140/90 or greater, please contact your primary care physician to follow up on this.  _______________________________________________________  If you are age 70 or older, your body mass index should be between 23-30. Your Body mass index is 26.89 kg/m. If this is out of the aforementioned range listed, please consider follow up with your Primary Care Provider.  If you are age 74 or younger, your body mass index should be between 19-25. Your Body mass index is 26.89 kg/m. If this is out of the aformentioned range listed, please consider follow up with your Primary Care Provider.   ________________________________________________________  The [facility] providers would like to encourage you to use [facility] to communicate with providers for non-urgent requests or questions.  Due to long hold times on the telephone, sending your provider a message by [facility] may be a faster and more efficient way to get a response.  Please allow 48 business hours for a response.  Please remember that this is for non-urgent requests.  _______________________________________________________  _______________________________________________________________

## 2022-05-31 NOTE — Progress Notes (Signed)
HISTORY OF PRESENT ILLNESS:  Danielle Hayes is a 72 y.o. female with multiple medical problems who was evaluated in the office January 05, 2022 regarding vague dysphagia and hiccups, worsening diarrhea, and IBS.  See that dictation for details.  She subsequently underwent colonoscopy and upper endoscopy February 28, 2022.  Colonoscopy revealed a diminutive adenoma which was removed.  The exam was otherwise normal.  Random colon biopsies were normal.  No microscopic colitis.  Follow-up in 7 years recommended. Upper endoscopy revealed a large caliber distal stricture.  This was dilated to 38 [place] with a Maloney dilator.  The exam was otherwise normal.  She was to continue on pantoprazole 40 mg daily and follow-up at this time.  Patient tells me that her globus type sensation has improved.  She continues with hiccups.  She also continues with a sensation certain foods and liquids moved down slowly through the esophagus.  She is not certain that dilation helped.  She continues with postprandial loose stools as previously described  REVIEW OF SYSTEMS:  All non-GI ROS negative as otherwise stated in HPI except for arthritis  Past Medical History:  Diagnosis Date   Anxiety    Arthritis    Asthma    Atrial fibrillation (HCC)    Bronchitis    C. difficile diarrhea 10/2014   states was related to antibiotic-? cefdinir   Complication of anesthesia    has big time phobia from nausea vomting last time got sick was at 72 years old    Dysrhythmia    PVC's   Ear infection 10/2015   Hyperlipidemia    Hypertension    PONV (postoperative nausea and vomiting)     Past Surgical History:  Procedure Laterality Date   COLONOSCOPY     KNEE ARTHROSCOPY     right and left   KNEE ARTHROSCOPY     right, left   KNEE CLOSED REDUCTION Left 08/09/2014   Procedure: LEFT KNEE CLOSED MANIPULATION ;  Surgeon: Ollen Gross, MD;  Location: [facility];  Service: Orthopedics;  Laterality: Left;   LEEP      TONSILLECTOMY     TOTAL KNEE ARTHROPLASTY Left 06/28/2014   Procedure: LEFT TOTAL KNEE ARTHROPLASTY;  Surgeon: Ollen Gross, MD;  Location: [facility];  Service: Orthopedics;  Laterality: Left;   TOTAL KNEE ARTHROPLASTY Right 10/17/2015   Procedure: RIGHT TOTAL KNEE ARTHROPLASTY;  Surgeon: Ollen Gross, MD;  Location: [facility];  Service: Orthopedics;  Laterality: Right;    Social History Murial Bedrosian  reports that she has never smoked. She has never used smokeless tobacco. She reports current alcohol use of about 6.0 standard drinks of alcohol per week. She reports that she does not use drugs.  family history includes Alzheimer's disease in her father; Breast cancer in her maternal aunt; Celiac disease in her brother; Colon polyps in her brother; Heart disease in her mother.  Allergies  Allergen Reactions   Cefdinir Other (See Comments)    c diff   Chlorhexidine Itching    Itchy and rash, patient says this is not an allergy 03/28/22   Hydrocodone Nausea Only   Oxycodone Other (See Comments)    hallucinations   Synvisc [Hylan G-F 20] Swelling    Red, could not walk , rooster injections into knee   Xarelto [Rivaroxaban] Hives       PHYSICAL EXAMINATION:  Vital signs: BP 112/70   Pulse 63   Ht 5\' 2"  (1.575 m)   Wt 147 lb (66.7 kg)  BMI 26.89 kg/m  General: Well-developed, well-nourished, no acute distress HEENT: Anicteric Abdomen: Not reexamined. Extremities: Abnormalities on the visible extremities Skin: No jaundice Psychiatric: alert and oriented x3. Cooperative  ASSESSMENT:  1.  Vague dysphagia and hiccups.  Etiology unclear.  Upper endoscopy with esophageal dilation seemingly unhelpful.  No meaningful response to PPI. 2.  Diarrhea predominant IBS   PLAN:  1.  Schedule barium swallow with tablet to further evaluate dysphagia. 2.  Prescribe Xifaxan 550 mg p.o. 3 times daily for 2 weeks for diarrhea predominant irritable bowel syndrome.  If not affordable, we can  try metronidazole.  I described both medications and the side effects profile to the patient today. 3.  Surveillance colonoscopy in 7 years 4.  Additional GI recommendations after the above A total time of 30 minutes was spent.  See the patient, obtaining interval history, performing medically appropriate physical exam, counseling and educating the patient regarding the above listed issues, ordering medication, ordering advanced radiology study, and documenting clinical information in the health record

## 2022-06-05 ENCOUNTER — Other Ambulatory Visit (HOSPITAL_COMMUNITY): Payer: Self-pay

## 2022-06-05 ENCOUNTER — Other Ambulatory Visit: Payer: Self-pay | Admitting: Internal Medicine

## 2022-06-05 ENCOUNTER — Telehealth: Payer: Self-pay | Admitting: Pharmacy Technician

## 2022-06-05 NOTE — Telephone Encounter (Signed)
Patient Advocate Encounter  Received notification from [facility] that prior authorization for XIFAXAN 550MG  is required.   PA submitted on 6.4.24 Key [id number] Status is pending

## 2022-06-08 ENCOUNTER — Ambulatory Visit (HOSPITAL_COMMUNITY)
Admission: RE | Admit: 2022-06-08 | Discharge: 2022-06-08 | Disposition: A | Discharge status: Home / Self Care | Payer: Medicare HMO | Source: Ambulatory Visit | Attending: Internal Medicine | Admitting: Internal Medicine

## 2022-06-08 DIAGNOSIS — K589 Irritable bowel syndrome without diarrhea: Secondary | ICD-10-CM | POA: Diagnosis not present

## 2022-06-08 DIAGNOSIS — R1310 Dysphagia, unspecified: Secondary | ICD-10-CM | POA: Diagnosis not present

## 2022-06-08 DIAGNOSIS — K2289 Other specified disease of esophagus: Secondary | ICD-10-CM | POA: Diagnosis not present

## 2022-06-08 NOTE — Telephone Encounter (Signed)
Even after prior authorization Xifaxan copay was still $675.  You're note indicated possibly using Flagyl if this was too expensive.  Please advise.

## 2022-06-08 NOTE — Telephone Encounter (Signed)
Please clarify dose of Flagyl - was last prescribed in 2016 as far as I can tell.

## 2022-06-11 ENCOUNTER — Other Ambulatory Visit: Payer: Self-pay

## 2022-06-11 DIAGNOSIS — K222 Esophageal obstruction: Secondary | ICD-10-CM

## 2022-06-11 MED ORDER — METRONIDAZOLE 250 MG PO TABS: 250.0000 mg | ORAL_TABLET | Freq: Three times a day (TID) | ORAL | 0 refills | Status: DC

## 2022-06-12 DIAGNOSIS — N390 Urinary tract infection, site not specified: Secondary | ICD-10-CM | POA: Diagnosis not present

## 2022-06-19 DIAGNOSIS — H2513 Age-related nuclear cataract, bilateral: Secondary | ICD-10-CM | POA: Diagnosis not present

## 2022-06-25 DIAGNOSIS — Z0100 Encounter for examination of eyes and vision without abnormal findings: Secondary | ICD-10-CM | POA: Diagnosis not present

## 2022-07-24 ENCOUNTER — Encounter: Payer: Self-pay | Admitting: Internal Medicine

## 2022-07-24 ENCOUNTER — Ambulatory Visit (AMBULATORY_SURGERY_CENTER): Payer: Medicare HMO | Admitting: Internal Medicine

## 2022-07-24 VITALS — BP 154/97 | HR 62 | Temp 97.7°F | Ht 62.0 in | Wt 147.0 lb

## 2022-07-24 DIAGNOSIS — R1310 Dysphagia, unspecified: Secondary | ICD-10-CM

## 2022-07-24 MED ORDER — SODIUM CHLORIDE 0.9 % IV SOLN: 500.0000 mL | Freq: Once | INTRAVENOUS | Status: DC

## 2022-07-24 NOTE — Progress Notes (Signed)
Patient did not stop Eliquis 3 days prior to procedure as instructed in letter and took last dose last night 07/23/22. Per Dr.Perry instructions EGD with dilatation rescheduled. Pre procedure instructions reviewed with patient. Hold Eliquis until further instructions. Patient verbalized understanding.

## 2022-07-26 ENCOUNTER — Ambulatory Visit (AMBULATORY_SURGERY_CENTER): Payer: Medicare HMO | Admitting: Internal Medicine

## 2022-07-26 ENCOUNTER — Encounter: Payer: Self-pay | Admitting: Internal Medicine

## 2022-07-26 VITALS — BP 124/69 | HR 57 | Temp 96.6°F | Resp 11 | Ht 62.0 in | Wt 147.0 lb

## 2022-07-26 DIAGNOSIS — I4891 Unspecified atrial fibrillation: Secondary | ICD-10-CM | POA: Diagnosis not present

## 2022-07-26 DIAGNOSIS — R1310 Dysphagia, unspecified: Secondary | ICD-10-CM

## 2022-07-26 DIAGNOSIS — K589 Irritable bowel syndrome without diarrhea: Secondary | ICD-10-CM

## 2022-07-26 DIAGNOSIS — I10 Essential (primary) hypertension: Secondary | ICD-10-CM | POA: Diagnosis not present

## 2022-07-26 DIAGNOSIS — K222 Esophageal obstruction: Secondary | ICD-10-CM

## 2022-07-26 MED ORDER — SODIUM CHLORIDE 0.9 % IV SOLN: 500.0000 mL | Freq: Once | INTRAVENOUS | Status: DC

## 2022-07-26 NOTE — Progress Notes (Signed)
Called to room to assist during endoscopic procedure.  Patient ID and intended procedure confirmed with present staff. Received instructions for my participation in the procedure from the performing physician.  

## 2022-07-26 NOTE — Patient Instructions (Addendum)
Recommendation:  - Patient has a contact number available for                            emergencies. The signs and symptoms of potential                            delayed complications were discussed with the                            patient. Return to normal activities tomorrow.                            Written discharge instructions were provided to the                            patient.                           - Resume previous diet.                           - Continue present medications.                           - Resume Eliquis today                           - PLEASE SCHEDULE ESOPHAGEAL MANOMETRY "dysphagia,                            rule out motility disorder"  YOU HAD AN ENDOSCOPIC PROCEDURE TODAY AT THE [facility]:   Refer to the procedure report that was given to you for any specific questions about what was found during the examination.  If the procedure report does not answer your questions, please call your gastroenterologist to clarify.  If you requested that your care partner not be given the details of your procedure findings, then the procedure report has been included in a sealed envelope for you to review at your convenience later.  YOU SHOULD EXPECT: Some feelings of bloating in the abdomen. Passage of more gas than usual.  Walking can help get rid of the air that was put into your GI tract during the procedure and reduce the bloating. If you had a lower endoscopy (such as a colonoscopy or flexible sigmoidoscopy) you may notice spotting of blood in your stool or on the toilet paper. If you underwent a bowel prep for your procedure, you may not have a normal bowel movement for a few days.  Please Note:  You might notice some irritation and congestion in your nose or some drainage.  This is from the oxygen used during your procedure.  There is no need for concern and it should clear up in a day or so.  SYMPTOMS TO REPORT IMMEDIATELY: Following upper  endoscopy (EGD)  Vomiting of blood or coffee ground material  New chest pain or pain under the shoulder blades  Painful or persistently difficult swallowing  New shortness of breath  Fever of 100F or higher  Black, tarry-looking stools  For urgent or emergent issues, a  gastroenterologist can be reached at any hour by calling [phone]. Do not use MyChart messaging for urgent concerns.    DIET:  We do recommend a small meal at first, but then you may proceed to your regular diet.  Drink plenty of fluids but you should avoid alcoholic beverages for 24 hours.  ACTIVITY:  You should plan to take it easy for the rest of today and you should NOT DRIVE or use heavy machinery until tomorrow (because of the sedation medicines used during the test).    FOLLOW UP: Our staff will call the number listed on your records the next business day following your procedure.  We will call around 7:15- 8:00 am to check on you and address any questions or concerns that you may have regarding the information given to you following your procedure. If we do not reach you, we will leave a message.     If any biopsies were taken you will be contacted by phone or by letter within the next 1-3 weeks.  Please call us at [phone] if you have not heard about the biopsies in 3 weeks.    SIGNATURES/CONFIDENTIALITY: You and/or your care partner have signed paperwork which will be entered into your electronic medical record.  These signatures attest to the fact that that the information above on your After Visit Summary has been reviewed and is understood.  Full responsibility of the confidentiality of this discharge information lies with you and/or your care-partner.

## 2022-07-26 NOTE — Progress Notes (Signed)
Report to PACU, RN, vss, BBS= Clear.  

## 2022-07-26 NOTE — Op Note (Signed)
[facility] Patient Name: Danielle Hayes Procedure Date: 07/26/2022 10:57 AM MRN: [id number] Endoscopist: Wilhemina Bonito. Marina Goodell , MD, [id number] Age: 72 Referring MD:  Date of Birth: [birth year] Gender: Female Account #: [id number] Procedure:                Upper GI endoscopy with balloon dilation of the                            esophagus. 20 mm max Indications:              Dysphagia, Abnormal cine-esophagram Medicines:                Monitored Anesthesia Care Procedure:                Pre-Anesthesia Assessment:                           - Prior to the procedure, a History and Physical                            was performed, and patient medications and                            allergies were reviewed. The patient's tolerance of                            previous anesthesia was also reviewed. The risks                            and benefits of the procedure and the sedation                            options and risks were discussed with the patient.                            All questions were answered, and informed consent                            was obtained. Prior Anticoagulants: The patient has                            taken Eliquis (apixaban), last dose was 3 days                            prior to procedure. ASA Grade Assessment: III - A                            patient with severe systemic disease. After                            reviewing the risks and benefits, the patient was                            deemed in satisfactory condition to undergo the  procedure.                           After obtaining informed consent, the endoscope was                            passed under direct vision. Throughout the                            procedure, the patient's blood pressure, pulse, and                            oxygen saturations were monitored continuously. The                            Olympus scope [id number] was introduced through  the                            mouth, and advanced to the second part of duodenum.                            The upper GI endoscopy was accomplished without                            difficulty. The patient tolerated the procedure                            well. Scope In: Scope Out: Findings:                 The esophagus was essentially normal. Subtle                            narrowing at the gastroesophageal junction A TTS                            dilator was passed through the scope. Dilation with                            an 18-19-20 mm balloon dilator was performed to 20                            mm. There was no significant resistance or heme.                           The stomach was normal. Very small hiatal hernia                           The examined duodenum was normal.                           The cardia and gastric fundus were normal on                            retroflexion. Complications:  No immediate complications. Estimated Blood Loss:     Estimated blood loss: none. Impression:               - Normal esophagus with subtle narrowing at the                            gastroesophageal junction. Dilated.                           - Normal stomach. Small hiatal hernia.                           - Normal examined duodenum.                           - No specimens collected. Recommendation:           - Patient has a contact number available for                            emergencies. The signs and symptoms of potential                            delayed complications were discussed with the                            patient. Return to normal activities tomorrow.                            Written discharge instructions were provided to the                            patient.                           - Resume previous diet.                           - Continue present medications.                           - Resume Eliquis today                            - PLEASE SCHEDULE ESOPHAGEAL MANOMETRY "dysphagia,                            rule out motility disorder" Wasim Hurlbut N. Marina Goodell, MD 07/26/2022 11:22:29 AM This report has been signed electronically.

## 2022-07-26 NOTE — Progress Notes (Signed)
Expand All Collapse All HISTORY OF PRESENT ILLNESS:   Danielle Hayes is a 72 y.o. female with multiple medical problems who was evaluated in the office January 05, 2022 regarding vague dysphagia and hiccups, worsening diarrhea, and IBS.  See that dictation for details.  She subsequently underwent colonoscopy and upper endoscopy February 28, 2022.  Colonoscopy revealed a diminutive adenoma which was removed.  The exam was otherwise normal.  Random colon biopsies were normal.  No microscopic colitis.  Follow-up in 7 years recommended. Upper endoscopy revealed a large caliber distal stricture.  This was dilated to 41 [place] with a Maloney dilator.  The exam was otherwise normal.  She was to continue on pantoprazole 40 mg daily and follow-up at this time.   Patient tells me that her globus type sensation has improved.  She continues with hiccups.  She also continues with a sensation certain foods and liquids moved down slowly through the esophagus.  She is not certain that dilation helped.   She continues with postprandial loose stools as previously described   REVIEW OF SYSTEMS:   All non-GI ROS negative as otherwise stated in HPI except for arthritis       Past Medical History:  Diagnosis Date   Anxiety     Arthritis     Asthma     Atrial fibrillation (HCC)     Bronchitis     C. difficile diarrhea 10/2014    states was related to antibiotic-? cefdinir   Complication of anesthesia      has big time phobia from nausea vomting last time got sick was at 72 years old    Dysrhythmia      PVC's   Ear infection 10/2015   Hyperlipidemia     Hypertension     PONV (postoperative nausea and vomiting)                 Past Surgical History:  Procedure Laterality Date   COLONOSCOPY       KNEE ARTHROSCOPY        right and left   KNEE ARTHROSCOPY        right, left   KNEE CLOSED REDUCTION Left 08/09/2014    Procedure: LEFT KNEE CLOSED MANIPULATION ;  Surgeon: Ollen Gross, MD;   Location: [facility];  Service: Orthopedics;  Laterality: Left;   LEEP       TONSILLECTOMY       TOTAL KNEE ARTHROPLASTY Left 06/28/2014    Procedure: LEFT TOTAL KNEE ARTHROPLASTY;  Surgeon: Ollen Gross, MD;  Location: [facility];  Service: Orthopedics;  Laterality: Left;   TOTAL KNEE ARTHROPLASTY Right 10/17/2015    Procedure: RIGHT TOTAL KNEE ARTHROPLASTY;  Surgeon: Ollen Gross, MD;  Location: [facility];  Service: Orthopedics;  Laterality: Right;          Social History Danielle Hayes  reports that she has never smoked. She has never used smokeless tobacco. She reports current alcohol use of about 6.0 standard drinks of alcohol per week. She reports that she does not use drugs.   family history includes Alzheimer's disease in her father; Breast cancer in her maternal aunt; Celiac disease in her brother; Colon polyps in her brother; Heart disease in her mother.   Allergies       Allergies  Allergen Reactions   Cefdinir Other (See Comments)      c diff   Chlorhexidine Itching      Itchy and rash, patient says this is not an allergy  03/28/22   Hydrocodone Nausea Only   Oxycodone Other (See Comments)      hallucinations   Synvisc [Hylan G-F 20] Swelling      Red, could not walk , rooster injections into knee   Xarelto [Rivaroxaban] Hives            PHYSICAL EXAMINATION:   Vital signs: BP 112/70   Pulse 63   Ht 5\' 2"  (1.575 m)   Wt 147 lb (66.7 kg)   BMI 26.89 kg/m  General: Well-developed, well-nourished, no acute distress HEENT: Anicteric Abdomen: Not reexamined. Extremities: Abnormalities on the visible extremities Skin: No jaundice Psychiatric: alert and oriented x3. Cooperative   ASSESSMENT:   1.  Vague dysphagia and hiccups.  Etiology unclear.  Upper endoscopy with esophageal dilation seemingly unhelpful.  No meaningful response to PPI. 2.  Diarrhea predominant IBS     PLAN:   1.  Schedule barium swallow with tablet to further evaluate dysphagia. 2.   Prescribe Xifaxan 550 mg p.o. 3 times daily for 2 weeks for diarrhea predominant irritable bowel syndrome.  If not affordable, we can try metronidazole.  I described both medications and the side effects profile to the patient today. 3.  Surveillance colonoscopy in 7 years 4.  Additional GI recommendations after the above  Recent H&P as above.  Esophagram showed stasis in the esophagus.  Delay of passage of tablet at the GE junction without obvious stricture.  Now for endoscopy with esophageal dilation.  Manometry thereafter.

## 2022-07-27 ENCOUNTER — Telehealth: Payer: Self-pay

## 2022-07-27 NOTE — Telephone Encounter (Signed)
Follow up call to pt, lm for pt to call if having any difficulty with normal activities or eating and drinking.  Also to call if any other questions or concerns.  

## 2022-07-30 ENCOUNTER — Other Ambulatory Visit: Payer: Self-pay | Admitting: Internal Medicine

## 2022-07-31 ENCOUNTER — Telehealth: Payer: Self-pay

## 2022-07-31 ENCOUNTER — Other Ambulatory Visit: Payer: Self-pay

## 2022-07-31 DIAGNOSIS — R1310 Dysphagia, unspecified: Secondary | ICD-10-CM

## 2022-07-31 NOTE — Telephone Encounter (Signed)
Spoke with patient to let her know I had scheduled her esophageal manometry per egd report dated 07/26/2022.  Patient is scheduled for 11/14/2022 at 8:30am.  Patient acknowledged and understood.

## 2022-09-20 NOTE — Telephone Encounter (Signed)
This encounter was created in error - please disregard.

## 2022-09-25 DIAGNOSIS — L80 Vitiligo: Secondary | ICD-10-CM | POA: Diagnosis not present

## 2022-09-25 DIAGNOSIS — L578 Other skin changes due to chronic exposure to nonionizing radiation: Secondary | ICD-10-CM | POA: Diagnosis not present

## 2022-09-25 DIAGNOSIS — L570 Actinic keratosis: Secondary | ICD-10-CM | POA: Diagnosis not present

## 2022-09-25 DIAGNOSIS — Z85828 Personal history of other malignant neoplasm of skin: Secondary | ICD-10-CM | POA: Diagnosis not present

## 2022-09-28 ENCOUNTER — Ambulatory Visit: Payer: Medicare HMO | Admitting: Internal Medicine

## 2022-10-04 ENCOUNTER — Ambulatory Visit: Payer: Medicare HMO | Admitting: Cardiology

## 2022-10-10 ENCOUNTER — Ambulatory Visit: Payer: Medicare HMO | Attending: Internal Medicine | Admitting: Cardiology

## 2022-10-10 ENCOUNTER — Encounter: Payer: Self-pay | Admitting: Cardiology

## 2022-10-10 VITALS — BP 120/84 | HR 64 | Resp 16 | Ht 62.0 in | Wt 148.6 lb

## 2022-10-10 DIAGNOSIS — I10 Essential (primary) hypertension: Secondary | ICD-10-CM | POA: Diagnosis not present

## 2022-10-10 DIAGNOSIS — I491 Atrial premature depolarization: Secondary | ICD-10-CM | POA: Diagnosis not present

## 2022-10-10 DIAGNOSIS — E782 Mixed hyperlipidemia: Secondary | ICD-10-CM | POA: Diagnosis not present

## 2022-10-10 NOTE — Progress Notes (Signed)
Cardiology Office Note:  .   Date:  10/10/2022  ID:  Danielle Hayes, DOB Feb 08, 1950, MRN [id number] PCP: Chilton Greathouse, MD  [facility] Providers Cardiologist:  Yates Decamp, MD    History of Present Illness: .   Danielle Hayes is a 72 y.o. Caucasian female patient with primary hypertension, paroxysmal atrial fibrillation diagnosed on 08/30/2021 when patient brought her smart watch for evaluation.  She did wear an event monitor for 10 days that did not reveal atrial fibrillation.  Presently on Eliquis and metoprolol succinate for rate control and anticoagulation, fairly active physically, presents for a 17-month office visit.  She is asymptomatic.  States that since being on metoprolol she has not had any further episodes of palpitations.  Discussed the use of AI scribe software for clinical note transcription with the patient, who gave verbal consent to proceed.  History of Present Illness   The patient, with a known history of premature ventricular contractions (PVCs), was previously diagnosed with atrial fibrillation (AFib). He was prescribed Eliquis, a blood thinner, as part of the treatment plan for AFib. The patient is active and engages in physical activities like skiing.  She has a family history of AFib, with his mother having had the condition. The patient's blood pressure is well controlled.      Review of Systems  Cardiovascular:  Negative for chest pain, dyspnea on exertion, leg swelling and palpitations.    Risk Assessment/Calculations:    CHA2DS2-VASc Score = 3   This indicates a 3.2% annual risk of stroke. The patient's score is based upon:  Physical Exam:   VS:  BP 120/84 (BP Location: Left Arm, Patient Position: Sitting, Cuff Size: Normal)   Pulse 64   Resp 16   Ht 5\' 2"  (1.575 m)   Wt 148 lb 9.6 oz (67.4 kg)   SpO2 96%   BMI 27.18 kg/m    Wt Readings from Last 3 Encounters:  10/10/22 148 lb 9.6 oz (67.4 kg)  07/26/22 147 lb (66.7 kg)   07/24/22 147 lb (66.7 kg)     Physical Exam Neck:     Vascular: No carotid bruit or JVD.  Cardiovascular:     Rate and Rhythm: Normal rate and regular rhythm.     Pulses: Intact distal pulses.     Heart sounds: Normal heart sounds. No murmur heard.    No gallop.  Pulmonary:     Effort: Pulmonary effort is normal.     Breath sounds: Normal breath sounds.  Abdominal:     General: Bowel sounds are normal.     Palpations: Abdomen is soft.  Musculoskeletal:     Right lower leg: No edema.     Left lower leg: No edema.     Studies Reviewed: Marland Kitchen    EKG:    EKG Interpretation Date/Time:  Wednesday October 10 2022 10:23:03 EDT Ventricular Rate:  60 PR Interval:  204 QRS Duration:  78 QT Interval:  398 QTC Calculation: 398 R Axis:   19  Text Interpretation: EKG 10/10/2022: Normal sinus rhythm at the rate of 60 bpm, normal axis, poor R progression, cannot exclude anteroseptal infarct old.  No significant change from 02/04/2020. Confirmed by Delrae Rend [phone]) on 10/10/2022 10:35:06 AM    Outpatient extended EKG monitoring 08/30/2021 Monitor 1 Patient had a min HR of 47 bpm, max HR of 164 bpm, and avg HR of 71 bpm. Predominant underlying rhythm was Sinus Rhythm. First Degree AV Block was present. 6 Supraventricular Tachycardia  runs occurred, the run with the fastest interval lasting 6 beats with a max rate of 164 bpm, the longest lasting 10 beats with an avg rate of 113 bpm. Some episodes of Supraventricular Tachycardia may be possible Atrial Tachycardia with variable block. Isolated SVEs were rare (<1.0%), SVE Couplets were rare (<1.0%), and SVE Triplets were rare (<1.0%). Isolated VEs were occasional (1.0%, 1923), VE Couplets were rare (<1.0%, 33), and VE Triplets were rare (<1.0%, 1). Ventricular Bigeminy and Trigeminy were present.   Monitor 2 (placed as first monitor was defective) Patient had a min HR of 43 bpm, max HR of 133 bpm, and avg HR of 65 bpm. Predominant underlying  rhythm was Sinus Rhythm. 12 Supraventricular Tachycardia runs occurred, the run with the fastest interval lasting 4 beats with a max rate of 133 bpm, the longest lasting 10 beats with an avg rate of 113 bpm. Isolated SVEs were occasional (1.2%, 7305), SVE Couplets were rare (<1.0%, 144), and SVE Triplets were rare (<1.0%, 26). Isolated VEs were rare (<1.0%), VE Couplets were rare (<1.0%), and no VE Triplets were present. Ventricular Trigeminy was present.   Echocardiogram 09/13/2021:  Normal LV systolic function with visual EF 60-65%. Left ventricle cavity  is normal in size. Normal left ventricular wall thickness. Normal global  wall motion. Normal diastolic filling pattern, normal LAP. Calculated EF 68%.  Structurally normal tricuspid valve with trace regurgitation. No evidence  of pulmonary hypertension. no prior available for comparison. Normal echocardiogram.     Exercise nuclear stress test 08/16/21 Myocardial perfusion is normal. Overall LV systolic function is normal without regional wall motion abnormalities. Stress LV EF: 74%.  Low risk study. Normal ECG stress. The patient exercised for 5 minutes and 13 seconds of a Modified Bruce protocol, achieving approximately 7.05 METs and 101% MPHR. The heart rate response was normal. The blood pressure response was normal. No previous exam available for comparison.  ASSESSMENT AND PLAN: .      ICD-10-CM   1. PAC (premature atrial contraction)  I49.1 EKG 12-Lead    2. Primary hypertension  I10     3. Mixed hyperlipidemia  E78.2       Assessment and Plan    Atrial Fibrillation Unclear diagnosis. Multiple EKG readings from patient's smartwatch do not show AFib. Previous 2-week monitor did not show any episodes of AFib. Patient has been on Eliquis due to presumed AFib diagnosis. -Discontinue Eliquis due to lack of clear evidence of AFib and potential risk of bleeding complications. -Continue monitoring for AFib using smartwatch. If  patient reports AFib for 24 hours continuously, consider re-initiation of anticoagulation therapy.  Premature Atrial Contractions (PACs) and Premature Ventricular Contractions (PVCs) Patient has extra heartbeats, but these are not life-threatening. -Continue Metoprolol Succinate for control of PACs and PVCs.  Follow-up No immediate follow-up needed. Patient can contact the office via MyChart if needed.    I had a very extensive discussion with the patient, personally reviewed the EKG that is on her smart watch, we also were able to easily track every R-R interval with calipers without change in any rhythm issues in spite of smart watch stating atrial fibrillation.  Occasional episodes of PACs and baseline wander was evident.  1 episode of atrial fibrillation at the rate of 130 bpm was clearly sinus tachycardia and patient was physically active at 4:30 in the afternoon.  I had a long and lengthy discussion with the patient regarding risks of atrial fibrillation, risks of anticoagulation, in the absence of documented atrial  fibrillation, high risk for bleeding complications especially as she is very active, likes to snow ski, like to be involved in physical activity, after discussion she is happy that she can discontinue Eliquis.  I did discuss with her regarding risks of atrial fibrillation in patients with PACs, age as a risk factor and hypertension as a risk factor as well.  This is unpredictable and we cannot use Eliquis for prophylaxis.  Patient understands clearly that she could in future develop atrial fibrillation and there is certainly risk of stroke with atrial fibrillation.  Hence shared decision was made today.  I have erased the diagnosis of atrial fibrillation from her chart.  This was a 40-minute office visit encounter.  Signed,  Yates Decamp, MD, [facility] 10/10/2022, 6:00 PM [facility] [address] [place], [place] [address] Phone: [phone]. Fax:  [phone]

## 2022-10-10 NOTE — Patient Instructions (Signed)
Medication Instructions:  Your physician has recommended you make the following change in your medication: Stop Eliquis  *If you need a refill on your cardiac medications before your next appointment, please call your pharmacy*   Lab Work: none If you have labs (blood work) drawn today and your tests are completely normal, you will receive your results only by: MyChart Message (if you have MyChart) OR A paper copy in the mail If you have any lab test that is abnormal or we need to change your treatment, we will call you to review the results.   Testing/Procedures: none   Follow-Up: At [facility], you and your health needs are our priority.  As part of our continuing mission to provide you with exceptional heart care, we have created designated Provider Care Teams.  These Care Teams include your primary Cardiologist (physician) and Advanced Practice Providers (APPs -  Physician Assistants and Nurse Practitioners) who all work together to provide you with the care you need, when you need it.  We recommend signing up for the patient portal called "MyChart".  Sign up information is provided on this After Visit Summary.  MyChart is used to connect with patients for Virtual Visits (Telemedicine).  Patients are able to view lab/test results, encounter notes, upcoming appointments, etc.  Non-urgent messages can be sent to your provider as well.   To learn more about what you can do with MyChart, go to [email or web address].    Your next appointment:   As needed  Provider:   Yates Decamp, MD     Other Instructions

## 2022-10-11 DIAGNOSIS — N952 Postmenopausal atrophic vaginitis: Secondary | ICD-10-CM | POA: Diagnosis not present

## 2022-10-11 DIAGNOSIS — Z780 Asymptomatic menopausal state: Secondary | ICD-10-CM | POA: Diagnosis not present

## 2022-10-11 DIAGNOSIS — Z01419 Encounter for gynecological examination (general) (routine) without abnormal findings: Secondary | ICD-10-CM | POA: Diagnosis not present

## 2022-10-16 ENCOUNTER — Ambulatory Visit: Payer: Medicare HMO | Attending: Internal Medicine | Admitting: Internal Medicine

## 2022-10-16 ENCOUNTER — Encounter: Payer: Self-pay | Admitting: Internal Medicine

## 2022-10-16 VITALS — BP 120/82 | HR 72 | Ht 63.0 in | Wt 151.5 lb

## 2022-10-16 DIAGNOSIS — I480 Paroxysmal atrial fibrillation: Secondary | ICD-10-CM

## 2022-10-16 DIAGNOSIS — I493 Ventricular premature depolarization: Secondary | ICD-10-CM | POA: Diagnosis not present

## 2022-10-16 DIAGNOSIS — I491 Atrial premature depolarization: Secondary | ICD-10-CM | POA: Diagnosis not present

## 2022-10-16 MED ORDER — FLECAINIDE ACETATE 50 MG PO TABS: 50.0000 mg | ORAL_TABLET | Freq: Two times a day (BID) | ORAL | 3 refills | Status: DC

## 2022-10-16 MED ORDER — DILTIAZEM HCL ER COATED BEADS 120 MG PO CP24: 120.0000 mg | ORAL_CAPSULE | Freq: Every day | ORAL | 3 refills | Status: DC

## 2022-10-16 NOTE — Progress Notes (Signed)
ELECTROPHYSIOLOGY OFFICE NOTE  Patient ID: Danielle Hayes, MRN: [id number], DOB/AGE: 10-Jul-1950 72 y.o. Admit date: (Not on file) Date of Consult: 10/16/2022  Primary Physician: Chilton Greathouse, MD Primary Cardiologist: Franco Nones Danielle Hayes is a 72 y.o. female who is being seen today for the evaluation of Atrial Fib at the request of JG.    HPI Danielle Hayes is a 72 y.o. female with a history of atrial fibrillation identified about a year ago the context of palpitations using an Apple Watch in the setting of a normal heart, hypertension and started on apixaban.  Recurrent palpitations saw Dr. Ottis Stain, was unable to find the documentation of the atrial fibrillation and discontinued the apixaban.  Recurrent palpitations prompted her to repeat her Apple Watch tracings which I reviewed along with Dr. Donnie Aho confirming atrial fibrillation.  With her atrial fibrillation she describes chest pressure, shortness of breath, palpitations;   when in regular rhythm she has no complaints although she has noted that her heart rate excursion is less with exercise and there may be some sluggishness since the introduction of the metoprolol.   No bleeding.  DATE TEST EF   8/23 Myoview   74 % Normal perfusion  8/223 Echo  60-65 % LA size normal         Date Cr K Hgb TSH  10/23 Normal  normal 15.9 2.38           Thromboembolic risk factors ( age -52, HTN-1, Gender-1) for a CHADSVASc Score of >=3   Past Medical History:  Diagnosis Date   Anxiety    Arthritis    Asthma    exercise induced   Atrial fibrillation (HCC)    Bronchitis    C. difficile diarrhea 10/2014   states was related to antibiotic-? cefdinir   Complication of anesthesia    has big time phobia from nausea vomting last time got sick was at 72 years old    Dysrhythmia    PVC's   Ear infection 10/2015   Hyperlipidemia    Hypertension    PONV (postoperative nausea and vomiting)       Surgical History:   Past Surgical History:  Procedure Laterality Date   COLONOSCOPY     KNEE CLOSED REDUCTION Left 08/09/2014   Procedure: LEFT KNEE CLOSED MANIPULATION ;  Surgeon: Ollen Gross, MD;  Location: [facility];  Service: Orthopedics;  Laterality: Left;   LEEP     TONSILLECTOMY     TOTAL KNEE ARTHROPLASTY Left 06/28/2014   Procedure: LEFT TOTAL KNEE ARTHROPLASTY;  Surgeon: Ollen Gross, MD;  Location: [facility];  Service: Orthopedics;  Laterality: Left;   TOTAL KNEE ARTHROPLASTY Right 10/17/2015   Procedure: RIGHT TOTAL KNEE ARTHROPLASTY;  Surgeon: Ollen Gross, MD;  Location: [facility];  Service: Orthopedics;  Laterality: Right;   UPPER GASTROINTESTINAL ENDOSCOPY       Home Meds: Current Meds  Medication Sig   albuterol (VENTOLIN HFA) 108 (90 Base) MCG/ACT inhaler Inhale 1-2 puffs into the lungs every 4 (four) hours as needed for wheezing or shortness of breath.   apixaban (ELIQUIS) 5 MG TABS tablet Take 5 mg by mouth 2 (two) times daily.   atorvastatin (LIPITOR) 40 MG tablet Take 40 mg by mouth every morning.    Black Cohosh 20 MG TABS Take by mouth.   buPROPion (WELLBUTRIN XL) 150 MG 24 hr tablet Take 150 mg by mouth daily.   cholecalciferol (VITAMIN D3) 25 MCG (  1000 UNIT) tablet Take 2,000 Units by mouth daily.   escitalopram (LEXAPRO) 5 MG tablet TAKE 1 TABLET BY MOUTH EVERY DAY FOR 30 DAYS for 90   estradiol (ESTRACE) 0.1 MG/GM vaginal cream Place 1 Applicatorful vaginally 2 (two) times a week.   fluticasone (FLONASE) 50 MCG/ACT nasal spray 2 sprays each nostril Nasal Once a day for 30 days   Glucosamine HCl 1000 MG TABS Take 1,000 mg by mouth in the morning and at bedtime.   metoprolol succinate (TOPROL-XL) 25 MG 24 hr tablet TAKE 1 TABLET (25 MG TOTAL) BY MOUTH DAILY.   Multiple Vitamin (MULTI-VITAMIN DAILY PO) Multi Vitamin   Omega 3 1200 MG CAPS Take 1,200 mg by mouth daily.   OVER THE COUNTER MEDICATION Take 1 tablet by mouth daily. Suprema dophilus (probiotic)   pantoprazole (PROTONIX) 40  MG tablet Take 40 mg by mouth daily.   temazepam (RESTORIL) 15 MG capsule Take 15 mg by mouth at bedtime as needed for sleep.   triamterene-hydrochlorothiazide (MAXZIDE-25) 37.5-25 MG per tablet Take 0.5 tablets by mouth every morning.    Allergies:  Allergies  Allergen Reactions   Cefdinir Other (See Comments)    c diff   Chlorhexidine Itching    Itchy and rash, patient says this is not an allergy 03/28/22   Hydrocodone Nausea Only   Oxycodone Other (See Comments)    hallucinations   Synvisc [Hylan G-F 20] Swelling    Red, could not walk , rooster injections into knee   Xarelto [Rivaroxaban] Hives    Social History   Socioeconomic History   Marital status: Married    Spouse name: Not on file   Number of children: 0   Years of education: Not on file   Highest education level: Not on file  Occupational History   Occupation: retired   Occupation: retired  Tobacco Use   Smoking status: Never   Smokeless tobacco: Never  Vaping Use   Vaping status: Never Used  Substance and Sexual Activity   Alcohol use: Yes    Alcohol/week: 4.0 standard drinks of alcohol    Types: 4 Glasses of wine per week    Comment: weekly   Drug use: No   Sexual activity: Not on file  Other Topics Concern   Not on file  Social History Narrative   Not on file   Social Determinants of Health   Financial Resource Strain: Not on file  Food Insecurity: Not on file  Transportation Needs: Not on file  Physical Activity: Not on file  Stress: Not on file  Social Connections: Not on file  Intimate Partner Violence: Not on file     Family History  Problem Relation Age of Onset   Heart disease Mother    Alzheimer's disease Father    Celiac disease Brother    Colon polyps Brother    Breast cancer Maternal Aunt    Colon cancer Neg Hx    Pancreatic cancer Neg Hx    Rectal cancer Neg Hx    Stomach cancer Neg Hx    Esophageal cancer Neg Hx      ROS:  Please see the history of present illness.    I suggested that she might benefit from a calcium score for further risk stratification given her hyperlipidemia as well as perhaps LP(a) all other systems reviewed and negative.    Physical Exam: Blood pressure 120/82, pulse 72, height 5\' 3"  (1.6 m), weight 151 lb 8 oz (68.7 kg), SpO2 97%. General:  Well developed, well nourished female in no acute distress. Head: Normocephalic, atraumatic, sclera non-icteric, no xanthomas, nares are without discharge. EENT: normal  Lymph Nodes:  none Neck: Negative for carotid bruits. JVD not elevated. Back:without scoliosis kyphosis Lungs: Clear bilaterally to auscultation without wheezes, rales, or rhonchi. Breathing is unlabored. Heart: RRR with S1 S2. No  systolic murmur . No rubs, or gallops appreciated. Abdomen: Soft, non-tender, non-distended with normoactive bowel sounds. No hepatomegaly. No rebound/guarding. No obvious abdominal masses. Msk:  Strength and tone appear normal for age. Extremities: No clubbing or cyanosis. edema.  Distal pedal pulses are 2+ and equal bilaterally. Skin: Warm and Dry Neuro: Alert and oriented X 3. CN III-XII intact Grossly normal sensory and motor function . Psych:  Responds to questions appropriately with a normal affect.        EKG: sinus @ 62 22/08/41 Event Recorder personnally reviewed  9/23 PAC and PVC rare Nonsustained atrial tach   Assessment and Plan:  Atrial fibrillation-paroxysmal  Hypertension  Hyperlipidemia   The patient has symptomatic paroxysmal atrial fibrillation.  With her CHA2DS2-VASc score and duration of atrial fibrillation of more than 24 hours i.e. not SCAF, she is appropriately anticoagulated and the dose for Eliquis is 5 mg twice daily based on age renal function and weight.  No bleeding.  Will continue.  Her atrial fibrillation when it first presented 8/23 was associated with a more rapid rate which has been addressed by the metoprolol.  This seems to have had effective rate  control,  but also limitations on exercise tolerance and some generalized sluggish.  We will discontinue it and put her on diltiazem 120.  In the context of her hypertension, the endpoints are further improved with a calcium blocker versus a beta-blocker.  Given her recurrent paroxysms and longer paroxysms of her atrial fibrillation have recommended rhythm control based on [place] AFNET we will begin her on flecainide 50 twice daily, side effects reviewed, as well as referral for catheter ablation.  So as to avoid making to medication changes at once, we will begin the flecainide in a couple of weeks, that she will be submitted for treadmill testing just a few days thereafter to assess for proarrhythmic risk.  With her hyperlipidemia, have recommended that she get an LP(a) as well as calcium scoring to better inform the need for lipid-lowering therapy.  Hypertension and/or therapy in the past was associated with a potassium of 3.5.  Will defer blood pressure management choices to her PCP Sherryl Manges

## 2022-10-16 NOTE — Patient Instructions (Signed)
Medication Instructions:  Your physician has recommended you make the following change in your medication:   ** Stop Metoprolol Succinate  ** Begin Diltiazem 120mg  - 1 tablet by mouth daily  Begin Flecainide 50mg  - 1 tablet by mouth twice daily  *If you need a refill on your cardiac medications before your next appointment, please call your pharmacy*   Lab Work: None ordered.  If you have labs (blood work) drawn today and your tests are completely normal, you will receive your results only by: MyChart Message (if you have MyChart) OR A paper copy in the mail If you have any lab test that is abnormal or we need to change your treatment, we will call you to review the results.   Testing/Procedures: in 2-3 weeks per Dr Graciela Husbands at the [facility]. Your physician has requested that you have an exercise tolerance test. For further information please visit [email or web address] Please also follow instruction sheet, as given.    Follow-Up: At [facility], you and your health needs are our priority.  As part of our continuing mission to provide you with exceptional heart care, we have created designated Provider Care Teams.  These Care Teams include your primary Cardiologist (physician) and Advanced Practice Providers (APPs -  Physician Assistants and Nurse Practitioners) who all work together to provide you with the care you need, when you need it.  We recommend signing up for the patient portal called "MyChart".  Sign up information is provided on this After Visit Summary.  MyChart is used to connect with patients for Virtual Visits (Telemedicine).  Patients are able to view lab/test results, encounter notes, upcoming appointments, etc.  Non-urgent messages can be sent to your provider as well.   To learn more about what you can do with MyChart, go to [email or web address].    Your next appointment:   Please schedule an appt with Dr Lalla Brothers to discuss ablation

## 2022-10-18 ENCOUNTER — Encounter: Payer: Self-pay | Admitting: Internal Medicine

## 2022-10-23 ENCOUNTER — Telehealth: Payer: Self-pay | Admitting: Cardiology

## 2022-10-23 NOTE — Telephone Encounter (Signed)
Left voicemail to return call to office.

## 2022-10-23 NOTE — Telephone Encounter (Signed)
Patient calling to see if the note can be made in [place] of what she can and can't do for the test on 10/29. Please advise

## 2022-10-24 ENCOUNTER — Encounter: Payer: Self-pay | Admitting: Internal Medicine

## 2022-10-24 MED ORDER — DILTIAZEM HCL ER COATED BEADS 120 MG PO CP24: 120.0000 mg | ORAL_CAPSULE | Freq: Two times a day (BID) | ORAL | Status: DC

## 2022-10-24 NOTE — Telephone Encounter (Signed)
Have spoken with pt and have asked her to increase her dilt form 120 every day >> bid Thanks SK

## 2022-10-24 NOTE — Telephone Encounter (Signed)
Returned call to patient who wanted instructions prior to her GXT scheduled her on 10/29. Sent instruction letter via [employer]. Reminded her that she is to start her Flecaininde 4 days prior to test, she confirmed she is planning to do that. No other drugs on her MAR that would need to be held or affected by the test.

## 2022-10-26 ENCOUNTER — Telehealth: Payer: Self-pay

## 2022-10-26 NOTE — Telephone Encounter (Signed)
Secure chat received from Janne Napoleon that also included Dr Rennis Golden regarding canceled TEE for DCCV scheduled 10/29/2022.  Dr Rennis Golden states 3 weeks of uninterrupted anti-coagulation is the standard of care and he would prefer TEE prior to DCCV or pt may wait until she has had 3 weeks of continued Eliquis. Pt notified of recommended TEE and is agreeable to proceed.  She is aware of her arrival and procedure times.

## 2022-10-26 NOTE — Telephone Encounter (Addendum)
Spoke with pt and advised per Dr Graciela Husbands pt will need TEE prior to DCCV and arrival time has been changed to 1030am for 12pm procedure start time.  Pt verbalizes understanding and agrees with current plan.

## 2022-10-26 NOTE — OR Nursing (Addendum)
Called patient with pre-procedure instructions for tomorrow.   Patient informed of:   Time to arrive for procedure.1045 Remain NPO past midnight.  Must have a ride home and a responsible adult to remain with them for 24 hours post procedure.  Confirmed blood thinner. Confirmed no breaks in taking blood thinner for 3+ weeks prior to procedure. Confirmed patient stopped all GLP-1s and GLP-2s for at least one week before procedure.   Left message for patient regarding above information. Instructed to call back with any questions.

## 2022-10-26 NOTE — Telephone Encounter (Addendum)
Per Dr Graciela Husbands pt seen in the office on 10/16/2022 by him.  Pt was in normal sinus rhythm on 10/16/2022.  Pt states she missed Eliquis doses on 10/11/2022, 10/12/2022 and 10/13/2022.  Dr Graciela Husbands states pt will not need TEE after all and that it will be safe to proceed with DCCV.  He will send Dr Rennis Golden a message to make him aware since he will be the provider for the DCCV.

## 2022-10-26 NOTE — Telephone Encounter (Signed)
*  Late Entry*  I spoke with pt on 10/24. Per Dr. Graciela Husbands he requested that this pt be scheduled for a DCCV on 10/24. I called Endo and Cath lab and neither could do it until Friday 10/25. I called the patient and she was out of town on vacation and would not be back in town until Saturday 10/26. I scheduled her for a DCCV on Monday 10/28. Sent her instruction letter via MyChart. Called her to go over instructions and she informed me that she had held her Eliquis for 3 days on 10/10-10/12 because she was told there was no documentation that she had A-fib.  She said she spoke to a family friend that's a physician and he told her she needed to be taking it so she started it back on 10/13.  I informed her that her DCCV may need to be changed to a different kind of procedure since she had missed a dose of Eliquis.   I have updated Marsha (Dr. Koren Bound RN) on this and she will speak with Dr. Graciela Husbands and see how he wants to proceed.

## 2022-10-26 NOTE — Telephone Encounter (Signed)
Spoke with pt and advised TEE has been canceled per Dr Graciela Husbands.  Pt will still need to arrive at the hospital at 1030am for 12 noon procedure.  Pt verbalizes understanding and thanked Charity fundraiser for the call.

## 2022-10-28 ENCOUNTER — Encounter (HOSPITAL_COMMUNITY): Payer: Self-pay | Admitting: Internal Medicine

## 2022-10-28 NOTE — Anesthesia Preprocedure Evaluation (Signed)
Anesthesia Evaluation    History of Anesthesia Complications (+) PONV and history of anesthetic complications  Airway        Dental   Pulmonary asthma           Cardiovascular hypertension, Pt. on medications + dysrhythmias Atrial Fibrillation      Neuro/Psych  PSYCHIATRIC DISORDERS Anxiety Depression    negative neurological ROS     GI/Hepatic negative GI ROS, Neg liver ROS,,,  Endo/Other  Hyperlipidemia  Renal/GU negative Renal ROS  negative genitourinary   Musculoskeletal  (+) Arthritis , Osteoarthritis,    Abdominal   Peds  Hematology Eliquis therapy   Anesthesia Other Findings   Reproductive/Obstetrics                             Anesthesia Physical Anesthesia Plan  ASA: 2  Anesthesia Plan: General   Post-op Pain Management: Minimal or no pain anticipated   Induction: Intravenous  PONV Risk Score and Plan: Treatment may vary due to age or medical condition and Propofol infusion  Airway Management Planned: Mask and Natural Airway  Additional Equipment: None  Intra-op Plan:   Post-operative Plan:   Informed Consent:   Plan Discussed with:   Anesthesia Plan Comments:        Anesthesia Quick Evaluation

## 2022-10-29 ENCOUNTER — Ambulatory Visit (HOSPITAL_COMMUNITY): Payer: Medicare HMO

## 2022-10-29 ENCOUNTER — Other Ambulatory Visit: Payer: Self-pay

## 2022-10-29 ENCOUNTER — Encounter (HOSPITAL_COMMUNITY): Payer: Self-pay

## 2022-10-29 ENCOUNTER — Ambulatory Visit (HOSPITAL_COMMUNITY)
Admission: RE | Admit: 2022-10-29 | Discharge: 2022-10-29 | Disposition: A | Discharge status: Home / Self Care | Payer: Medicare HMO | Attending: Internal Medicine | Admitting: Internal Medicine

## 2022-10-29 ENCOUNTER — Encounter (HOSPITAL_COMMUNITY): Payer: Self-pay | Admitting: Anesthesiology

## 2022-10-29 ENCOUNTER — Encounter (HOSPITAL_COMMUNITY)
Admission: RE | Disposition: A | Discharge status: Home / Self Care | Payer: Self-pay | Source: Home / Self Care | Attending: Internal Medicine

## 2022-10-29 DIAGNOSIS — I4891 Unspecified atrial fibrillation: Secondary | ICD-10-CM | POA: Diagnosis not present

## 2022-10-29 DIAGNOSIS — I480 Paroxysmal atrial fibrillation: Secondary | ICD-10-CM

## 2022-10-29 DIAGNOSIS — Z539 Procedure and treatment not carried out, unspecified reason: Secondary | ICD-10-CM | POA: Diagnosis not present

## 2022-10-29 DIAGNOSIS — I491 Atrial premature depolarization: Secondary | ICD-10-CM

## 2022-10-29 SURGERY — INVASIVE LAB ABORTED CASE
Anesthesia: General

## 2022-10-29 SURGICAL SUPPLY — 1 items: PAD DEFIB RADIO PHYSIO CONN (PAD) ×1

## 2022-10-29 NOTE — Progress Notes (Signed)
   EKG prior to the procedure shows normal sinus rhythm. Therefore, the cardioversion has been cancelled.  Chrystie Nose, MD, [facility], FACP    [facility]  Medical Director of the [facility] Lipid [facility] &  Cardiovascular Risk Reduction Clinic Diplomate of the American Board of Clinical Lipidology Attending Cardiologist  Direct Dial: [phone]  Fax: [phone]  Website:  [email or web address]

## 2022-10-30 ENCOUNTER — Ambulatory Visit: Payer: Medicare HMO

## 2022-10-30 ENCOUNTER — Ambulatory Visit (HOSPITAL_COMMUNITY): Payer: Medicare HMO | Admitting: Physician Assistant

## 2022-11-05 ENCOUNTER — Ambulatory Visit (HOSPITAL_COMMUNITY): Payer: Medicare HMO | Admitting: Physician Assistant

## 2022-11-06 ENCOUNTER — Ambulatory Visit: Payer: Medicare HMO

## 2022-11-07 ENCOUNTER — Telehealth: Payer: Self-pay | Admitting: Internal Medicine

## 2022-11-07 DIAGNOSIS — Z96653 Presence of artificial knee joint, bilateral: Secondary | ICD-10-CM | POA: Diagnosis not present

## 2022-11-07 DIAGNOSIS — M545 Low back pain, unspecified: Secondary | ICD-10-CM | POA: Diagnosis not present

## 2022-11-07 MED ORDER — DRONEDARONE HCL 400 MG PO TABS: 400.0000 mg | ORAL_TABLET | Freq: Two times a day (BID) | ORAL | 3 refills | Status: DC

## 2022-11-07 NOTE — Addendum Note (Signed)
Addended by: Alois Cliche on: 11/07/2022 06:09 PM   Modules accepted: Orders

## 2022-11-07 NOTE — Telephone Encounter (Signed)
If she does not convert on her own, by Monday lets plan repeat cardioversion

## 2022-11-07 NOTE — Telephone Encounter (Signed)
Stop flecainide Try multaq 400 mg bid

## 2022-11-07 NOTE — Telephone Encounter (Signed)
Inbound call from patient, states she is currently Afib and would like to cancel her esophageal manometry scheduled for 11/13. She states she would like to call back to reschedule once her Afib is in order.

## 2022-11-08 NOTE — Telephone Encounter (Signed)
Called pt and let he know her appt has been cancelled but since they are booking so far out we went ahead and rescheduled the appt to 03/06/22 at 10:30am. Pt aware of the new appt date and time.

## 2022-11-08 NOTE — Telephone Encounter (Signed)
Called and spoke with patient this am She will continue the dilt and begin multaq   I told her if her HR drifts into the 40;s following the multq then she should decrease her dirt to once daily   Thanks SK

## 2022-11-12 ENCOUNTER — Ambulatory Visit: Payer: Medicare HMO | Attending: Internal Medicine | Admitting: Internal Medicine

## 2022-11-12 ENCOUNTER — Encounter: Payer: Self-pay | Admitting: Internal Medicine

## 2022-11-12 VITALS — BP 164/82 | HR 62 | Ht 63.0 in | Wt 153.4 lb

## 2022-11-12 DIAGNOSIS — I4891 Unspecified atrial fibrillation: Secondary | ICD-10-CM

## 2022-11-12 DIAGNOSIS — I493 Ventricular premature depolarization: Secondary | ICD-10-CM | POA: Diagnosis not present

## 2022-11-12 MED ORDER — FUROSEMIDE 20 MG PO TABS: ORAL_TABLET | ORAL | 0 refills | Status: DC

## 2022-11-12 NOTE — Patient Instructions (Signed)
Medication Instructions:  Your physician has recommended you make the following change in your medication:   ** Stop Ditiazem  ** Begin Furosemide 20mg  - 1 tablet by mouth x 5 days then stop  *If you need a refill on your cardiac medications before your next appointment, please call your pharmacy*   Lab Work: None ordered.  If you have labs (blood work) drawn today and your tests are completely normal, you will receive your results only by: MyChart Message (if you have MyChart) OR A paper copy in the mail If you have any lab test that is abnormal or we need to change your treatment, we will call you to review the results.   Testing/Procedures: None ordered.    Follow-Up: At [facility], you and your health needs are our priority.  As part of our continuing mission to provide you with exceptional heart care, we have created designated Provider Care Teams.  These Care Teams include your primary Cardiologist (physician) and Advanced Practice Providers (APPs -  Physician Assistants and Nurse Practitioners) who all work together to provide you with the care you need, when you need it.  We recommend signing up for the patient portal called "MyChart".  Sign up information is provided on this After Visit Summary.  MyChart is used to connect with patients for Virtual Visits (Telemedicine).  Patients are able to view lab/test results, encounter notes, upcoming appointments, etc.  Non-urgent messages can be sent to your provider as well.   To learn more about what you can do with MyChart, go to [email or web address].    Your next appointment:   As scheduled with Dr Lalla Brothers

## 2022-11-12 NOTE — Progress Notes (Signed)
Patient Care Team: Chilton Greathouse, MD as PCP - General (Internal Medicine) Yates Decamp, MD as PCP - Cardiology (Cardiology) Lanier Prude, MD as PCP - Electrophysiology (Cardiology)   HPI  Danielle Hayes is a 72 y.o. female seen in follow-up for atrial fibrillation identified initially on her Apple watch.  Initially seen the summer 2023 and again this fall.  Rapid rates.  Started on anticoagulation and flecainide.  Uptitrated to 100 mg twice daily with breakthrough atrial fibrillation.  Also started on diltiazem and uptitrated to twice daily because of rapid rates.  With breakthrough atrial fibrillation the flecainide was discontinued and started on dronaderone.  She is seen today as an add-on because of complaints that over the last 5 days she has put on about 5 pounds and has swelling in her feet and some shortness of breath.  DATE TEST EF    8/23 Myoview   74 % Normal perfusion  8/223 Echo  60-65 % LA size normal              Date Cr K Hgb TSH  10/23 Normal  normal 15.9 2.38               Records and Results Reviewed    Past Medical History:  Diagnosis Date   Anxiety    Arthritis    Asthma    exercise induced   Atrial fibrillation (HCC)    Bronchitis    C. difficile diarrhea 10/2014   states was related to antibiotic-? cefdinir   Complication of anesthesia    has big time phobia from nausea vomting last time got sick was at 72 years old    Dysrhythmia    PVC's   Ear infection 10/2015   Hyperlipidemia    Hypertension    PONV (postoperative nausea and vomiting)     Past Surgical History:  Procedure Laterality Date   COLONOSCOPY     KNEE CLOSED REDUCTION Left 08/09/2014   Procedure: LEFT KNEE CLOSED MANIPULATION ;  Surgeon: Ollen Gross, MD;  Location: [facility];  Service: Orthopedics;  Laterality: Left;   LEEP     TONSILLECTOMY     TOTAL KNEE ARTHROPLASTY Left 06/28/2014   Procedure: LEFT TOTAL KNEE ARTHROPLASTY;  Surgeon: Ollen Gross, MD;   Location: [facility];  Service: Orthopedics;  Laterality: Left;   TOTAL KNEE ARTHROPLASTY Right 10/17/2015   Procedure: RIGHT TOTAL KNEE ARTHROPLASTY;  Surgeon: Ollen Gross, MD;  Location: [facility];  Service: Orthopedics;  Laterality: Right;   UPPER GASTROINTESTINAL ENDOSCOPY      Current Meds  Medication Sig   albuterol (VENTOLIN HFA) 108 (90 Base) MCG/ACT inhaler Inhale 1-2 puffs into the lungs every 4 (four) hours as needed (Bronchitis).   apixaban (ELIQUIS) 5 MG TABS tablet Take 5 mg by mouth 2 (two) times daily.   atorvastatin (LIPITOR) 40 MG tablet Take 40 mg by mouth every morning.    BLACK COHOSH PO Take 1 mg by mouth daily.   buPROPion (WELLBUTRIN XL) 150 MG 24 hr tablet Take 150 mg by mouth every morning.   Cholecalciferol (VITAMIN D) 50 MCG (2000 UT) tablet Take 2,000 Units by mouth daily.   dronedarone (MULTAQ) 400 MG tablet Take 1 tablet (400 mg total) by mouth 2 (two) times daily with a meal.   escitalopram (LEXAPRO) 5 MG tablet TAKE 1 TABLET BY MOUTH EVERY DAY FOR 30 DAYS for 90   estradiol (ESTRACE) 0.1 MG/GM vaginal cream Place 1 Applicatorful  vaginally 2 (two) times a week.   fluticasone (FLONASE) 50 MCG/ACT nasal spray Place 1-2 sprays into both nostrils daily as needed (Bronchitis).   furosemide (LASIX) 20 MG tablet Take 1 tablet by mouth daily x 5 days then stop.   Glucosamine HCl 1000 MG TABS Take 1,000 mg by mouth daily.   Multiple Vitamin (MULTI-VITAMIN DAILY PO) Take 1 tablet by mouth daily.   Omega 3 1200 MG CAPS Take 1,200 mg by mouth daily.   OVER THE COUNTER MEDICATION Take 1 tablet by mouth daily. Suprema dophilus (probiotic)   PRESCRIPTION MEDICATION Use as directed 1 Application in the mouth or throat See admin instructions. FLUOROURACIL 5% + CALCIPOTRIENE 0.005% Apply a small amount as directed as directed to lower lip before bed on Mon, Wed, Fri nights for 4 weeks straig as needed   temazepam (RESTORIL) 15 MG capsule Take 15 mg by mouth at bedtime as needed for  sleep.   triamterene-hydrochlorothiazide (MAXZIDE-25) 37.5-25 MG per tablet Take 1 tablet by mouth every morning.   [DISCONTINUED] diltiazem (CARDIZEM CD) 120 MG 24 hr capsule Take 1 capsule (120 mg total) by mouth 2 (two) times daily.    Allergies  Allergen Reactions   Cefdinir Other (See Comments)    c diff   Chlorhexidine Itching    Itchy and rash, patient says this is not an allergy 03/28/22   Hydrocodone Nausea Only   Oxycodone Other (See Comments)    hallucinations   Synvisc [Hylan G-F 20] Swelling    Red, could not walk , rooster injections into knee   Xarelto [Rivaroxaban] Hives      Review of Systems negative except from HPI and PMH  Physical Exam BP (!) 164/82   Pulse 62   Ht 5\' 3"  (1.6 m)   Wt 153 lb 6.4 oz (69.6 kg)   SpO2 94%   BMI 27.17 kg/m  Well developed and well nourished in no acute distress HENT normal E scleral and icterus clear Neck Supple JVP 6-7 perfect; carotids brisk and full Clear to ausculation Regular rate and rhythm, no murmurs gallops or rub Soft with active bowel sounds No clubbing cyanosis 2+ Edema Alert and oriented, grossly normal motor and sensory function Skin Warm and Dry  ECG sinus at 62 Intervals 25/09/44  CrCl cannot be calculated (Patient's most recent lab result is older than the maximum 21 days allowed.).   Assessment and  Plan Atrial fibrillation  Peripheral edema  First-degree AV block   The patient has significant peripheral edema accumulated over the last week or 2 in the context of diltiazem dronaderone and restoring sinus rhythm.  It is most likely that is still diltiazem.  We will discontinue the diltiazem.  Give her furosemide 20 mg to take daily x 5 days.  For right now we will continue the dronaderone.  There is modest prolongation of the PR interval from 204--246 ms.  I doubt that this is sufficient to cause hemodynamic consequences.  She has follow-up with Dr. Lalla Brothers in about 10 days.  She will let us  know if the symptoms do not abate over the next few days.  We weill arrange for an echo       Current medicines are reviewed at length with the patient today .  The patient does not   have concerns regarding medicines.

## 2022-11-16 DIAGNOSIS — R82998 Other abnormal findings in urine: Secondary | ICD-10-CM | POA: Diagnosis not present

## 2022-11-16 DIAGNOSIS — E785 Hyperlipidemia, unspecified: Secondary | ICD-10-CM | POA: Diagnosis not present

## 2022-11-21 ENCOUNTER — Ambulatory Visit: Payer: Medicare HMO | Attending: Cardiology | Admitting: Cardiology

## 2022-11-21 ENCOUNTER — Ambulatory Visit: Payer: Medicare HMO | Admitting: Cardiology

## 2022-11-21 ENCOUNTER — Encounter: Payer: Self-pay | Admitting: Cardiology

## 2022-11-21 VITALS — BP 152/74 | HR 64 | Ht 63.0 in | Wt 151.2 lb

## 2022-11-21 DIAGNOSIS — I4891 Unspecified atrial fibrillation: Secondary | ICD-10-CM | POA: Diagnosis not present

## 2022-11-21 NOTE — Patient Instructions (Addendum)
Medication Instructions:  Your physician recommends that you continue on your current medications as directed. Please refer to the Current Medication list given to you today.  *If you need a refill on your cardiac medications before your next appointment, please call your pharmacy*   Lab Work: BMET and CBC - prior to CT scan and ablation at [facility]  Testing/Procedures: Cardiac CT  Your physician has requested that you have cardiac CT. Cardiac computed tomography (CT) is a painless test that uses an x-ray machine to take clear, detailed pictures of your heart.  We will call you to schedule your CT scan. It will be done about one week prior to your ablation.   Ablation Your physician has recommended that you have an ablation. Catheter ablation is a medical procedure used to treat some cardiac arrhythmias (irregular heartbeats). During catheter ablation, a long, thin, flexible tube is put into a blood vessel in your groin (upper thigh), or neck. This tube is called an ablation catheter. It is then guided to your heart through the blood vessel. Radio frequency waves destroy small areas of heart tissue where abnormal heartbeats may cause an arrhythmia to start.  You are scheduled for Atrial Fibrillation Ablation on Monday, March 10 with Dr. Steffanie Dunn.Please arrive at the Main Entrance A at [facility]: [address] [place], [place] [address] at 8:00 AM    Follow-Up: At [facility], you and your health needs are our priority.  As part of our continuing mission to provide you with exceptional heart care, we have created designated Provider Care Teams.  These Care Teams include your primary Cardiologist (physician) and Advanced Practice Providers (APPs -  Physician Assistants and Nurse Practitioners) who all work together to provide you with the care you need, when you need it.  Your next appointment:   We will call you to arrange your follow up appointments

## 2022-11-21 NOTE — Progress Notes (Signed)
Labs 11/20/2022:  Lp(a)  7.9

## 2022-11-21 NOTE — Progress Notes (Signed)
Electrophysiology Office Follow up Visit Note:    Date:  11/21/2022   ID:  Danielle Hayes, DOB [birth year], MRN [id number]  PCP:  Chilton Greathouse, MD  [facility] Cardiologist:  Yates Decamp, MD  [facility] Electrophysiologist:  Lanier Prude, MD    Interval History:     Danielle Hayes is a 72 y.o. female who presents for a follow up visit.   Discussed the use of AI scribe software for clinical note transcription with the patient, who gave verbal consent to proceed.  Danielle Hayes is a 72 year old woman who I am seeing today for her atrial fibrillation.  She normally follows with Dr. Graciela Husbands in clinic.  Her A-fib was initially diagnosed in the summer 2023.  She was started on anticoagulation and flecainide in the past.  She was then since started on Multaq after stopping flecainide due to incomplete suppression of the arrhythmia.  She presents today to discuss more durable rhythm control.  There seems to be a link between her arrhythmia/medications and fluid accumulation related to her diastolic heart failure.   History of Present Illness   The patient, with a history of atrial fibrillation (AFib), presents for a consultation regarding her condition and potential treatment options. She reports that her symptoms have been managed with Multaq, which she tolerates well, and Eliquis for stroke protection. Despite the medication, she still experiences some premature ventricular contractions (PVCs). The patient is active and wishes to maintain her lifestyle, expressing a desire for a more permanent solution to her AFib. She is considering catheter ablation as a potential treatment option.            Past medical, surgical, social and family history were reviewed.  ROS:   Please see the history of present illness.    All other systems reviewed and are negative.  EKGs/Labs/Other Studies Reviewed:    The following studies were reviewed today:  October 31, 2021 ZIO  monitor showed no atrial fibrillation, 1% burden of PVCs and 1% burden of PACs.  September 15, 2021 echo EF 60%, no significant valvular disease  November 12, 2022 ECG shows sinus rhythm, first-degree AV delay (PR 246 ms), narrow QRS       Physical Exam:    VS:  BP (!) 152/74   Pulse 64   Ht 5\' 3"  (1.6 m)   Wt 151 lb 3.2 oz (68.6 kg)   SpO2 96%   BMI 26.78 kg/m     Wt Readings from Last 3 Encounters:  11/21/22 151 lb 3.2 oz (68.6 kg)  11/12/22 153 lb 6.4 oz (69.6 kg)  10/29/22 151 lb 7.3 oz (68.7 kg)     Physical Exam   GENERAL: Woman in no distress. CHEST: Lungs clear to auscultation. CARDIOVASCULAR: Heart rhythm regular, no increased work of breathing noted.          ASSESSMENT:    1. Atrial fibrillation, unspecified type (HCC)    PLAN:    In order of problems listed above:  Assessment and Plan    Atrial Fibrillation Symptomatic with palpitations and shortness of breath. Currently on Multaq with some improvement but still experiencing PVCs. Discussed the limitations of antiarrhythmic drugs and the potential benefits of catheter ablation. -Continue Multaq twice daily and Eliquis for stroke protection. -Consider catheter ablation for more durable symptom control and potential discontinuation of antiarrhythmic drugs.  Premature Ventricular Contractions (PVCs) Noted by the patient despite Multaq therapy. -Continue current treatment and monitor symptoms.  General Health Maintenance -Physical exam  revealed regular rhythm, no increased work of breathing, and clear lungs.        Discussed treatment options today for AF including antiarrhythmic drug therapy and ablation. Discussed risks, recovery and likelihood of success with each treatment strategy. Risk, benefits, and alternatives to EP study and ablation for afib were discussed. These risks include but are not limited to stroke, bleeding, vascular damage, tamponade, perforation, damage to the esophagus, lungs,  phrenic nerve and other structures, pulmonary vein stenosis, worsening renal function, coronary vasospasm and death.  Discussed potential need for repeat ablation procedures and antiarrhythmic drugs after an initial ablation. The patient understands these risk and wishes to proceed.  We will therefore proceed with catheter ablation at the next available time.  Carto, ICE, anesthesia are requested for the procedure.  Will also obtain CT PV protocol prior to the procedure to exclude LAA thrombus and further evaluate atrial anatomy.        Signed, Steffanie Dunn, MD, [facility], [facility] 11/21/2022 3:21 PM    Electrophysiology [facility]

## 2023-01-04 ENCOUNTER — Other Ambulatory Visit: Payer: Self-pay | Admitting: Gynecology

## 2023-01-04 DIAGNOSIS — Z1231 Encounter for screening mammogram for malignant neoplasm of breast: Secondary | ICD-10-CM

## 2023-01-09 ENCOUNTER — Ambulatory Visit: Payer: Medicare HMO | Admitting: Cardiology

## 2023-02-07 ENCOUNTER — Encounter: Payer: Self-pay | Admitting: Cardiology

## 2023-02-08 ENCOUNTER — Telehealth: Payer: Self-pay | Admitting: Internal Medicine

## 2023-02-08 NOTE — Telephone Encounter (Signed)
 Pt states she is having a cardiac ablation and will have to cancel the EM that is scheduled for 3/5. Offered to reschedule appt but pt states she is doing some better and wants to get through all the cardiac issues first. Appt cancelled.

## 2023-02-08 NOTE — Telephone Encounter (Signed)
 Inbound call from patient requesting to cancel 3/5 esophogeal manometry. States she is have a heart ablation a few days later and her cardiologist recommended for her to cancel and reschedule. Patient is requesting a call back. Please advise, thank you.

## 2023-02-11 ENCOUNTER — Other Ambulatory Visit: Payer: Self-pay

## 2023-02-11 ENCOUNTER — Telehealth: Payer: Self-pay

## 2023-02-11 ENCOUNTER — Telehealth (HOSPITAL_COMMUNITY): Payer: Self-pay

## 2023-02-11 DIAGNOSIS — I4891 Unspecified atrial fibrillation: Secondary | ICD-10-CM

## 2023-02-11 NOTE — Telephone Encounter (Signed)
 Spoke with patient to complete one month pre-procedure call.     Has the patient been diagnosed with any new medical conditions?  Patient was scheduled for an esophageal study on 03/06/23. She reported, occasionally when she eats food, she develops hiccups and takes a little longer for her food to process. She has had esophageal dilation in the past. Will confirm with Dr. Elvin Hammer if patient acceptable to proceed with procedure without contraindications.  Any recent hospitalizations or surgeries? NO Has the patient started any new medications? NO Patient made aware to contact office to inform of any new medications started. Any changes in activities of daily living? NO  Pre-procedure testing scheduled: CT on 02/13/23 and lab work prior.  Confirmed patient has started taking ELIQUIS  and will continue taking medication before procedure or it may need to be rescheduled.  Confirmed patient is scheduled for Atrial Fibrillation Ablation on Monday, March 10 with Dr. Harvie Liner. Instructed patient to arrive at the Main Entrance A at [facility]: [address] [place], [place] [address] and check in at Admitting at 8:00 AM  Advised of plan to go home the same day and will only stay overnight if medically necessary. You MUST have a responsible adult to drive you home and MUST be with you the first 24 hours after you arrive home or your procedure could be cancelled.  Patient verbalized understanding to information provided and is agreeable to proceed with procedure.

## 2023-02-11 NOTE — Telephone Encounter (Signed)
 Called pt to go over CT/Ablation Instructions and to give her a time to go for labs.  She will go to [facility] on 2/11 for updated labs. CT is scheduled on 2/12 at 1:30 PM.   Instruction letters have been sent via MyChart. Pt will review and call with any questions or concerns.

## 2023-02-12 ENCOUNTER — Telehealth (HOSPITAL_COMMUNITY): Payer: Self-pay

## 2023-02-12 ENCOUNTER — Ambulatory Visit: Payer: Medicare HMO

## 2023-02-12 DIAGNOSIS — I4891 Unspecified atrial fibrillation: Secondary | ICD-10-CM | POA: Diagnosis not present

## 2023-02-12 NOTE — Telephone Encounter (Signed)
Hilarie Fredrickson, MD  Primitivo Gauze, RN; Lanier Prude, MD  Yes.  Okay from my standpoint.  Esophagus most on recent EGD was essentially normal.    ----- Message ----- From: Primitivo Gauze, RN Sent: 02/11/2023   3:12 PM EST To: Hilarie Fredrickson, MD Subject: GI recommendations- upcoming procedure        Dr. Marina Goodell  Patient is scheduled for an atrial fibrillation ablation with Dr. Lalla Brothers on 03/11/23, requiring general anesthesia. Patient cancelled an esophageal study for 3/5. Can you please advise if patient is acceptable to proceed with procedure from your standpoint without any contraindications?  Thanks, Pablo Lawrence., RN/ Nurse navigator

## 2023-02-13 ENCOUNTER — Other Ambulatory Visit (HOSPITAL_COMMUNITY): Payer: Medicare HMO

## 2023-02-13 ENCOUNTER — Encounter: Payer: Self-pay | Admitting: Cardiology

## 2023-02-13 ENCOUNTER — Ambulatory Visit (HOSPITAL_COMMUNITY)
Admission: RE | Admit: 2023-02-13 | Discharge: 2023-02-13 | Disposition: A | Discharge status: Home / Self Care | Payer: Medicare HMO | Source: Ambulatory Visit | Attending: Cardiology | Admitting: Cardiology

## 2023-02-13 DIAGNOSIS — I4891 Unspecified atrial fibrillation: Secondary | ICD-10-CM | POA: Diagnosis not present

## 2023-02-13 LAB — BASIC METABOLIC PANEL
BUN/Creatinine Ratio: 18 (ref 12–28)
BUN: 13 mg/dL (ref 8–27)
CO2: 26 mmol/L (ref 20–29)
Calcium: 9.5 mg/dL (ref 8.7–10.3)
Chloride: 100 mmol/L (ref 96–106)
Creatinine, Ser: 0.74 mg/dL (ref 0.57–1.00)
Glucose: 88 mg/dL (ref 70–99)
Potassium: 5 mmol/L (ref 3.5–5.2)
Sodium: 141 mmol/L (ref 134–144)
eGFR: 86 mL/min/{1.73_m2} (ref >59)

## 2023-02-13 LAB — CBC
Hematocrit: 47.6 % — ABNORMAL HIGH (ref 34.0–46.6)
Hemoglobin: 16 g/dL — ABNORMAL HIGH (ref 11.1–15.9)
MCH: 31.9 pg (ref 26.6–33.0)
MCHC: 33.6 g/dL (ref 31.5–35.7)
MCV: 95 fL (ref 79–97)
Platelets: 175 10*3/uL (ref 150–450)
RBC: 5.01 x10E6/uL (ref 3.77–5.28)
RDW: 13.4 % (ref 11.7–15.4)
WBC: 4.7 10*3/uL (ref 3.4–10.8)

## 2023-02-13 MED ORDER — IOHEXOL 350 MG/ML SOLN
95.0000 mL | Freq: Once | INTRAVENOUS | Status: AC | PRN
  Administered 2023-02-13: 95 mL via INTRAVENOUS

## 2023-02-14 ENCOUNTER — Ambulatory Visit
Admission: RE | Admit: 2023-02-14 | Discharge: 2023-02-14 | Disposition: A | Discharge status: Home / Self Care | Payer: Medicare HMO | Source: Ambulatory Visit | Attending: Gynecology | Admitting: Gynecology

## 2023-02-14 DIAGNOSIS — Z1231 Encounter for screening mammogram for malignant neoplasm of breast: Secondary | ICD-10-CM

## 2023-02-20 ENCOUNTER — Other Ambulatory Visit: Payer: Self-pay | Admitting: Gynecology

## 2023-02-20 DIAGNOSIS — R928 Other abnormal and inconclusive findings on diagnostic imaging of breast: Secondary | ICD-10-CM

## 2023-02-28 ENCOUNTER — Ambulatory Visit
Admission: RE | Admit: 2023-02-28 | Discharge: 2023-02-28 | Disposition: A | Discharge status: Home / Self Care | Payer: Medicare HMO | Source: Ambulatory Visit | Attending: Gynecology | Admitting: Gynecology

## 2023-02-28 DIAGNOSIS — R928 Other abnormal and inconclusive findings on diagnostic imaging of breast: Secondary | ICD-10-CM | POA: Diagnosis not present

## 2023-03-04 ENCOUNTER — Encounter: Payer: Self-pay | Admitting: Cardiology

## 2023-03-04 ENCOUNTER — Telehealth (HOSPITAL_COMMUNITY): Payer: Self-pay

## 2023-03-04 NOTE — Telephone Encounter (Signed)
 Call placed to patient to discuss upcoming procedure.   CT: completed and acceptable.  Labs: completed and acceptable.   Any recent signs of acute illness or been started on antibiotics? NO Any diabetic medications to hold? NO Any missed doses of blood thinner?  NO Advised patient to continue taking ANTICOAGULANT: Eliquis (Apixaban) without missing any doses.  Medication instructions:  On the morning of your procedure DO NOT take any medication., including Eliquis or the procedure may be rescheduled. Nothing to eat or drink after midnight prior to your procedure.  Confirmed patient is scheduled for Atrial Fibrillation Ablation on Monday, March 10 with Dr. Steffanie Dunn. Instructed patient to arrive at the Main Entrance A at [facility]: [address] [place], [place] [address] and check in at Admitting at 8:00 AM  Advised of plan to go home the same day and will only stay overnight if medically necessary. You MUST have a responsible adult to drive you home and MUST be with you the first 24 hours after you arrive home or your procedure could be cancelled.  Patient's questions answered and expressed appreciation for the call. She verbalized understanding to all instructions provided and agreed to proceed with procedure.

## 2023-03-06 ENCOUNTER — Encounter (HOSPITAL_COMMUNITY): Admission: RE | Payer: Self-pay | Source: Home / Self Care

## 2023-03-06 ENCOUNTER — Ambulatory Visit (HOSPITAL_COMMUNITY): Admission: RE | Admit: 2023-03-06 | Payer: Medicare HMO | Source: Home / Self Care | Admitting: Internal Medicine

## 2023-03-06 ENCOUNTER — Other Ambulatory Visit: Payer: Self-pay | Admitting: Internal Medicine

## 2023-03-06 SURGERY — ESOPHAGEAL MANOMETRY (EM)

## 2023-03-07 ENCOUNTER — Encounter: Payer: Self-pay | Admitting: Emergency Medicine

## 2023-03-08 NOTE — Pre-Procedure Instructions (Signed)
 Instructed patient on the following items: Arrival time 0800 Nothing to eat or drink after midnight No meds AM of procedure Responsible person to drive you home and stay with you for 24 hrs  Have you missed any doses of anti-coagulant Eliquis-takes twice a day, hasn't missed any doses.  Don't take Monday morning.

## 2023-03-11 ENCOUNTER — Other Ambulatory Visit: Payer: Self-pay

## 2023-03-11 ENCOUNTER — Ambulatory Visit (HOSPITAL_BASED_OUTPATIENT_CLINIC_OR_DEPARTMENT_OTHER)

## 2023-03-11 ENCOUNTER — Other Ambulatory Visit (HOSPITAL_COMMUNITY): Payer: Self-pay

## 2023-03-11 ENCOUNTER — Ambulatory Visit (HOSPITAL_COMMUNITY)

## 2023-03-11 ENCOUNTER — Encounter (HOSPITAL_COMMUNITY)
Admission: RE | Disposition: A | Discharge status: Home / Self Care | Payer: Self-pay | Source: Home / Self Care | Attending: Cardiology

## 2023-03-11 ENCOUNTER — Ambulatory Visit (HOSPITAL_COMMUNITY)
Admission: RE | Admit: 2023-03-11 | Discharge: 2023-03-11 | Disposition: A | Discharge status: Home / Self Care | Payer: Medicare HMO | Attending: Cardiology | Admitting: Cardiology

## 2023-03-11 DIAGNOSIS — I4891 Unspecified atrial fibrillation: Secondary | ICD-10-CM | POA: Insufficient documentation

## 2023-03-11 DIAGNOSIS — I110 Hypertensive heart disease with heart failure: Secondary | ICD-10-CM | POA: Insufficient documentation

## 2023-03-11 DIAGNOSIS — Z79899 Other long term (current) drug therapy: Secondary | ICD-10-CM | POA: Diagnosis not present

## 2023-03-11 DIAGNOSIS — I5030 Unspecified diastolic (congestive) heart failure: Secondary | ICD-10-CM | POA: Insufficient documentation

## 2023-03-11 DIAGNOSIS — I493 Ventricular premature depolarization: Secondary | ICD-10-CM | POA: Diagnosis not present

## 2023-03-11 DIAGNOSIS — Z7901 Long term (current) use of anticoagulants: Secondary | ICD-10-CM | POA: Diagnosis not present

## 2023-03-11 HISTORY — PX: ATRIAL FIBRILLATION ABLATION: EP1191

## 2023-03-11 LAB — POCT ACTIVATED CLOTTING TIME: Activated Clotting Time: 297 s

## 2023-03-11 SURGERY — ATRIAL FIBRILLATION ABLATION
Anesthesia: General

## 2023-03-11 MED ORDER — FENTANYL CITRATE (PF) 250 MCG/5ML IJ SOLN
INTRAMUSCULAR | Status: DC | PRN
  Administered 2023-03-11 (×2): 50 ug via INTRAVENOUS
  Administered 2023-03-11: 40 ug via INTRAVENOUS

## 2023-03-11 MED ORDER — SODIUM CHLORIDE 0.9% FLUSH: 3.0000 mL | Freq: Two times a day (BID) | INTRAVENOUS | Status: DC

## 2023-03-11 MED ORDER — APIXABAN 5 MG PO TABS
5.0000 mg | ORAL_TABLET | Freq: Two times a day (BID) | ORAL | Status: DC
  Administered 2023-03-11: 5 mg via ORAL
  Filled 2023-03-11: qty 1

## 2023-03-11 MED ORDER — PANTOPRAZOLE SODIUM 40 MG PO TBEC
40.0000 mg | DELAYED_RELEASE_TABLET | Freq: Every day | ORAL | Status: DC
  Administered 2023-03-11: 40 mg via ORAL
  Filled 2023-03-11 (×2): qty 1

## 2023-03-11 MED ORDER — SODIUM CHLORIDE 0.9 % IV SOLN: INTRAVENOUS | Status: DC

## 2023-03-11 MED ORDER — ACETAMINOPHEN 500 MG PO TABS
ORAL_TABLET | ORAL | Status: AC
  Filled 2023-03-11: qty 2

## 2023-03-11 MED ORDER — LIDOCAINE 2% (20 MG/ML) 5 ML SYRINGE
INTRAMUSCULAR | Status: DC | PRN
  Administered 2023-03-11: 40 mg via INTRAVENOUS

## 2023-03-11 MED ORDER — ONDANSETRON HCL 4 MG/2ML IJ SOLN: 4.0000 mg | Freq: Four times a day (QID) | INTRAMUSCULAR | Status: DC | PRN

## 2023-03-11 MED ORDER — ACETAMINOPHEN 500 MG PO TABS
1000.0000 mg | ORAL_TABLET | Freq: Once | ORAL | Status: AC
  Administered 2023-03-11: 1000 mg via ORAL

## 2023-03-11 MED ORDER — PROPOFOL 500 MG/50ML IV EMUL
INTRAVENOUS | Status: DC | PRN
  Administered 2023-03-11: 125 ug/kg/min via INTRAVENOUS

## 2023-03-11 MED ORDER — SODIUM CHLORIDE 0.9% FLUSH: 3.0000 mL | INTRAVENOUS | Status: DC | PRN

## 2023-03-11 MED ORDER — ACETAMINOPHEN 325 MG PO TABS: 650.0000 mg | ORAL_TABLET | ORAL | Status: DC | PRN

## 2023-03-11 MED ORDER — PROTAMINE SULFATE 10 MG/ML IV SOLN
INTRAVENOUS | Status: DC | PRN
  Administered 2023-03-11: 35 mg via INTRAVENOUS

## 2023-03-11 MED ORDER — HEPARIN (PORCINE) IN NACL 1000-0.9 UT/500ML-% IV SOLN
INTRAVENOUS | Status: DC | PRN
  Administered 2023-03-11 (×3): 500 mL

## 2023-03-11 MED ORDER — ONDANSETRON HCL 4 MG/2ML IJ SOLN
INTRAMUSCULAR | Status: DC | PRN
  Administered 2023-03-11: 4 mg via INTRAVENOUS

## 2023-03-11 MED ORDER — DEXAMETHASONE SODIUM PHOSPHATE 10 MG/ML IJ SOLN
INTRAMUSCULAR | Status: DC | PRN
Start: 2023-03-11 — End: 2023-03-11
  Administered 2023-03-11: 10 mg via INTRAVENOUS

## 2023-03-11 MED ORDER — SUGAMMADEX SODIUM 200 MG/2ML IV SOLN
INTRAVENOUS | Status: DC | PRN
  Administered 2023-03-11: 200 mg via INTRAVENOUS
  Administered 2023-03-11: 100 mg via INTRAVENOUS

## 2023-03-11 MED ORDER — HEPARIN SODIUM (PORCINE) 1000 UNIT/ML IJ SOLN
INTRAMUSCULAR | Status: DC | PRN
  Administered 2023-03-11: 10000 [IU] via INTRAVENOUS
  Administered 2023-03-11: 3000 [IU] via INTRAVENOUS

## 2023-03-11 MED ORDER — FENTANYL CITRATE (PF) 100 MCG/2ML IJ SOLN
INTRAMUSCULAR | Status: AC
  Filled 2023-03-11: qty 2

## 2023-03-11 MED ORDER — PROPOFOL 10 MG/ML IV BOLUS: INTRAVENOUS | Status: DC | PRN

## 2023-03-11 MED ORDER — PROPOFOL 10 MG/ML IV BOLUS
INTRAVENOUS | Status: DC | PRN
  Administered 2023-03-11: 140 mg via INTRAVENOUS

## 2023-03-11 MED ORDER — COLCHICINE 0.6 MG PO TABS
0.6000 mg | ORAL_TABLET | Freq: Two times a day (BID) | ORAL | Status: DC
Start: 2023-03-11 — End: 2023-03-11
  Administered 2023-03-11: 0.6 mg via ORAL
  Filled 2023-03-11 (×2): qty 1

## 2023-03-11 MED ORDER — ROCURONIUM BROMIDE 10 MG/ML (PF) SYRINGE
PREFILLED_SYRINGE | INTRAVENOUS | Status: DC | PRN
  Administered 2023-03-11: 20 mg via INTRAVENOUS
  Administered 2023-03-11: 40 mg via INTRAVENOUS
  Administered 2023-03-11: 10 mg via INTRAVENOUS

## 2023-03-11 MED ORDER — ATROPINE SULFATE 1 MG/10ML IJ SOSY
PREFILLED_SYRINGE | INTRAMUSCULAR | Status: DC | PRN
  Administered 2023-03-11: 1 mg via INTRAVENOUS

## 2023-03-11 MED ORDER — SODIUM CHLORIDE 0.9 % IV SOLN
250.0000 mL | INTRAVENOUS | Status: DC | PRN
Start: 2023-03-11 — End: 2023-03-11

## 2023-03-11 MED ORDER — COLCHICINE 0.6 MG PO TABS
0.6000 mg | ORAL_TABLET | Freq: Two times a day (BID) | ORAL | 0 refills | Status: DC
  Filled 2023-03-11: qty 10, 5d supply, fill #0

## 2023-03-11 SURGICAL SUPPLY — 20 items
BAG SNAP BAND KOVER 36X36 (MISCELLANEOUS) IMPLANT
CABLE PFA RX CATH CONN (CABLE) IMPLANT
CATH 8FR REPROCESSED SOUNDSTAR (CATHETERS) ×1 IMPLANT
CATH 8FR SOUNDSTAR REPROCESSED (CATHETERS) IMPLANT
CATH FARAWAVE ABLATION 31 (CATHETERS) IMPLANT
CATH OCTARAY 2.0 F 3-3-3-3-3 (CATHETERS) IMPLANT
CATH WEBSTER BI DIR CS D-F CRV (CATHETERS) IMPLANT
CLOSURE PERCLOSE PROSTYLE (VASCULAR PRODUCTS) IMPLANT
COVER SWIFTLINK CONNECTOR (BAG) ×1 IMPLANT
DILATOR VESSEL 38 20CM 16FR (INTRODUCER) IMPLANT
GUIDEWIRE INQWIRE 1.5J.035X260 (WIRE) IMPLANT
INQWIRE 1.5J .035X260CM (WIRE) ×1 IMPLANT
KIT VERSACROSS CNCT FARADRIVE (KITS) IMPLANT
PACK EP LF (CUSTOM PROCEDURE TRAY) ×1 IMPLANT
PAD DEFIB RADIO PHYSIO CONN (PAD) ×1 IMPLANT
PATCH CARTO3 (PAD) IMPLANT
SHEATH FARADRIVE STEERABLE (SHEATH) IMPLANT
SHEATH PINNACLE 8F 10CM (SHEATH) IMPLANT
SHEATH PINNACLE 9F 10CM (SHEATH) IMPLANT
SHEATH PROBE COVER 6X72 (BAG) IMPLANT

## 2023-03-11 NOTE — Anesthesia Procedure Notes (Signed)
 Procedure Name: Intubation Date/Time: 03/11/2023 10:35 AM  Performed by: Kayleen Memos, CRNAPre-anesthesia Checklist: Patient identified, Emergency Drugs available, Suction available and Patient being monitored Patient Re-evaluated:Patient Re-evaluated prior to induction Oxygen Delivery Method: Circle System Utilized Preoxygenation: Pre-oxygenation with 100% oxygen Induction Type: IV induction Ventilation: Mask ventilation without difficulty and Oral airway inserted - appropriate to patient size Laryngoscope Size: Mac and 3 Grade View: Grade I Tube type: Oral Tube size: 6.5 mm Number of attempts: 1 Airway Equipment and Method: Stylet and Oral airway Placement Confirmation: ETT inserted through vocal cords under direct vision, positive ETCO2 and breath sounds checked- equal and bilateral Secured at: 22 cm Tube secured with: Tape Dental Injury: Teeth and Oropharynx as per pre-operative assessment

## 2023-03-11 NOTE — Anesthesia Preprocedure Evaluation (Addendum)
 Anesthesia Evaluation  Patient identified by MRN, date of birth, ID band Patient awake    Reviewed: Allergy & Precautions, NPO status , Patient's Chart, lab work & pertinent test results  History of Anesthesia Complications (+) PONV and history of anesthetic complications  Airway Mallampati: II  TM Distance: >3 FB Neck ROM: Full    Dental  (+) Dental Advisory Given   Pulmonary asthma    breath sounds clear to auscultation       Cardiovascular hypertension, Pt. on medications + dysrhythmias Atrial Fibrillation  Rhythm:Regular Rate:Normal     Neuro/Psych negative neurological ROS     GI/Hepatic negative GI ROS, Neg liver ROS,,,  Endo/Other  negative endocrine ROS    Renal/GU negative Renal ROS     Musculoskeletal   Abdominal   Peds  Hematology negative hematology ROS (+)   Anesthesia Other Findings   Reproductive/Obstetrics                             Anesthesia Physical Anesthesia Plan  ASA: 2  Anesthesia Plan: General   Post-op Pain Management: Tylenol PO (pre-op)*   Induction: Intravenous  PONV Risk Score and Plan: 4 or greater and Ondansetron, Dexamethasone, Propofol infusion, TIVA and Treatment may vary due to age or medical condition  Airway Management Planned: Oral ETT  Additional Equipment:   Intra-op Plan:   Post-operative Plan: Extubation in OR  Informed Consent: I have reviewed the patients History and Physical, chart, labs and discussed the procedure including the risks, benefits and alternatives for the proposed anesthesia with the patient or authorized representative who has indicated his/her understanding and acceptance.     Dental advisory given  Plan Discussed with: CRNA  Anesthesia Plan Comments:        Anesthesia Quick Evaluation

## 2023-03-11 NOTE — Transfer of Care (Signed)
 Immediate Anesthesia Transfer of Care Note  Patient: Danielle Hayes  Procedure(s) Performed: ATRIAL FIBRILLATION ABLATION  Patient Location: PACU and Cath Lab  Anesthesia Type:General  Level of Consciousness: awake, alert , and oriented  Airway & Oxygen Therapy: Patient Spontanous Breathing and Patient connected to nasal cannula oxygen  Post-op Assessment: Report given to RN and Post -op Vital signs reviewed and stable  Post vital signs: Reviewed and stable  Last Vitals:  Vitals Value Taken Time  BP 122/75 03/11/23 1200  Temp 36.3 C 03/11/23 1200  Pulse 73 03/11/23 1202  Resp 30 03/11/23 1202  SpO2 96 % 03/11/23 1202  Vitals shown include unfiled device data.  Last Pain:  Vitals:   03/11/23 1200  TempSrc: Temporal  PainSc:          Complications: No notable events documented.

## 2023-03-11 NOTE — H&P (Signed)
 Electrophysiology Office Follow up Visit Note:     Date:  03/11/2023    ID:  Danielle Hayes, DOB 06-25-1950, MRN [id number]   PCP:  Chilton Greathouse, MD  [facility] Cardiologist:  Yates Decamp, MD  [facility] Electrophysiologist:  Lanier Prude, MD      Interval History:       Danielle Hayes is a 73 y.o. female who presents for a follow up visit.    Discussed the use of AI scribe software for clinical note transcription with the patient, who gave verbal consent to proceed.   Danielle Hayes is a 73 year old woman who I am seeing today for her atrial fibrillation.  She normally follows with Dr. Graciela Husbands in clinic.  Her A-fib was initially diagnosed in the summer 2023.  She was started on anticoagulation and flecainide in the past.  She was then since started on Multaq after stopping flecainide due to incomplete suppression of the arrhythmia.  She presents today to discuss more durable rhythm control.  There seems to be a link between her arrhythmia/medications and fluid accumulation related to her diastolic heart failure.     History of Present Illness   The patient, with a history of atrial fibrillation (AFib), presents for a consultation regarding her condition and potential treatment options. She reports that her symptoms have been managed with Multaq, which she tolerates well, and Eliquis for stroke protection. Despite the medication, she still experiences some premature ventricular contractions (PVCs). The patient is active and wishes to maintain her lifestyle, expressing a desire for a more permanent solution to her AFib. She is considering catheter ablation as a potential treatment option.      Presents for AF ablation. Procedure reviewed.       Objective Past medical, surgical, social and family history were reviewed.   ROS:   Please see the history of present illness.    All other systems reviewed and are negative.   EKGs/Labs/Other Studies Reviewed:     The  following studies were reviewed today:   October 31, 2021 ZIO monitor showed no atrial fibrillation, 1% burden of PVCs and 1% burden of PACs.   September 15, 2021 echo EF 60%, no significant valvular disease   November 12, 2022 ECG shows sinus rhythm, first-degree AV delay (PR 246 ms), narrow QRS         Physical Exam:     VS:  BP 157/83   Pulse 62   Ht 5\' 3"  (1.6 m)   Wt 151 lb 3.2 oz (68.6 kg)   SpO2 96%   BMI 26.78 kg/m         Wt Readings from Last 3 Encounters:  11/21/22 151 lb 3.2 oz (68.6 kg)  11/12/22 153 lb 6.4 oz (69.6 kg)  10/29/22 151 lb 7.3 oz (68.7 kg)      Physical Exam   GENERAL: Woman in no distress. CHEST: Lungs clear to auscultation. CARDIOVASCULAR: Heart rhythm regular, no increased work of breathing noted.         Assessment ASSESSMENT:     1. Atrial fibrillation, unspecified type (HCC)     PLAN:     In order of problems listed above:   Assessment and Plan    Atrial Fibrillation Symptomatic with palpitations and shortness of breath. Currently on Multaq with some improvement but still experiencing PVCs. Discussed the limitations of antiarrhythmic drugs and the potential benefits of catheter ablation. -Continue Multaq twice daily and Eliquis for stroke protection. -Consider catheter ablation  for more durable symptom control and potential discontinuation of antiarrhythmic drugs.   Premature Ventricular Contractions (PVCs) Noted by the patient despite Multaq therapy. -Continue current treatment and monitor symptoms.   General Health Maintenance -Physical exam revealed regular rhythm, no increased work of breathing, and clear lungs.           Discussed treatment options today for AF including antiarrhythmic drug therapy and ablation. Discussed risks, recovery and likelihood of success with each treatment strategy. Risk, benefits, and alternatives to EP study and ablation for afib were discussed. These risks include but are not limited to  stroke, bleeding, vascular damage, tamponade, perforation, damage to the esophagus, lungs, phrenic nerve and other structures, pulmonary vein stenosis, worsening renal function, coronary vasospasm and death.  Discussed potential need for repeat ablation procedures and antiarrhythmic drugs after an initial ablation. The patient understands these risk and wishes to proceed.  We will therefore proceed with catheter ablation at the next available time.  Carto, ICE, anesthesia are requested for the procedure.  Will also obtain CT PV protocol prior to the procedure to exclude LAA thrombus and further evaluate atrial anatomy.        Presents for AF ablation. Procedure reviewed.       Signed, Steffanie Dunn, MD, [facility], [facility] 03/11/2023 Electrophysiology [facility]

## 2023-03-11 NOTE — Discharge Instructions (Signed)

## 2023-03-11 NOTE — Progress Notes (Signed)
 Patient walked to the bathroom without difficulties, bilateral groins level 0, clean, dry, and intact.

## 2023-03-12 ENCOUNTER — Telehealth (HOSPITAL_COMMUNITY): Payer: Self-pay

## 2023-03-12 ENCOUNTER — Encounter (HOSPITAL_COMMUNITY): Payer: Self-pay | Admitting: Cardiology

## 2023-03-12 NOTE — Anesthesia Postprocedure Evaluation (Signed)
 Anesthesia Post Note  Patient: Danielle Hayes  Procedure(s) Performed: ATRIAL FIBRILLATION ABLATION     Patient location during evaluation: Cath Lab Anesthesia Type: General Level of consciousness: awake Pain management: pain level controlled Vital Signs Assessment: post-procedure vital signs reviewed and stable Respiratory status: spontaneous breathing, nonlabored ventilation and respiratory function stable Cardiovascular status: blood pressure returned to baseline and stable Postop Assessment: no apparent nausea or vomiting Anesthetic complications: no   No notable events documented.  Last Vitals:  Vitals:   03/11/23 1430 03/11/23 1500  BP: 134/78 136/78  Pulse: 62 62  Resp: (!) 21 (!) 22  Temp:    SpO2: 93% 91%    Last Pain:  Vitals:   03/11/23 1200  TempSrc: Temporal  PainSc:    Pain Goal:                   Catheryn Bacon Geddy [place]

## 2023-03-12 NOTE — Telephone Encounter (Signed)
 Spoke with patient to complete post procedure follow up call.  Patient reports no complications with groin sites.   Instructions reviewed with patient:  Remove large bandage at puncture site after 24 hours. It is normal to have bruising, tenderness and a pea or marble sized lump/knot at the groin site which can take up to three months to resolve.  Get help right away if you notice sudden swelling at the puncture site.  Check your puncture site every day for signs of infection: fever, redness, swelling, pus drainage, warmth, foul odor or excessive pain. If this occurs, please call the office at [phone], to speak with the nurse. Get help right away if your puncture site is bleeding and the bleeding does not stop after applying firm pressure to the area.  You may continue to have skipped beats/ atrial fibrillation during the first several months after your procedure.  It is very important not to miss any doses of your blood thinner Eliquis. Patient restarted taking this medication on yesterday, 03/11/23   You will follow up with the Afib clinic on 04/08/23 and follow up with the APP on 06/11/23.   Patient verbalized understanding to all instructions provided.

## 2023-03-14 ENCOUNTER — Encounter: Payer: Self-pay | Admitting: Cardiology

## 2023-03-14 NOTE — Telephone Encounter (Signed)
 Pt is calling to check status of message

## 2023-03-25 DIAGNOSIS — L239 Allergic contact dermatitis, unspecified cause: Secondary | ICD-10-CM | POA: Diagnosis not present

## 2023-03-25 DIAGNOSIS — L570 Actinic keratosis: Secondary | ICD-10-CM | POA: Diagnosis not present

## 2023-04-08 ENCOUNTER — Ambulatory Visit (HOSPITAL_COMMUNITY)
Admission: RE | Admit: 2023-04-08 | Discharge: 2023-04-08 | Disposition: A | Discharge status: Home / Self Care | Source: Ambulatory Visit | Attending: Physician Assistant | Admitting: Physician Assistant

## 2023-04-08 ENCOUNTER — Encounter (HOSPITAL_COMMUNITY): Payer: Self-pay | Admitting: Physician Assistant

## 2023-04-08 VITALS — BP 134/90 | HR 67 | Ht 62.0 in | Wt 146.0 lb

## 2023-04-08 DIAGNOSIS — I480 Paroxysmal atrial fibrillation: Secondary | ICD-10-CM | POA: Insufficient documentation

## 2023-04-08 DIAGNOSIS — E785 Hyperlipidemia, unspecified: Secondary | ICD-10-CM | POA: Diagnosis not present

## 2023-04-08 DIAGNOSIS — Z7901 Long term (current) use of anticoagulants: Secondary | ICD-10-CM | POA: Insufficient documentation

## 2023-04-08 DIAGNOSIS — D6869 Other thrombophilia: Secondary | ICD-10-CM | POA: Diagnosis not present

## 2023-04-08 DIAGNOSIS — Z79899 Other long term (current) drug therapy: Secondary | ICD-10-CM | POA: Diagnosis not present

## 2023-04-08 DIAGNOSIS — Z5181 Encounter for therapeutic drug level monitoring: Secondary | ICD-10-CM

## 2023-04-08 DIAGNOSIS — I10 Essential (primary) hypertension: Secondary | ICD-10-CM | POA: Insufficient documentation

## 2023-04-08 NOTE — Progress Notes (Signed)
 Primary Care Physician: Chilton Greathouse, MD Primary Cardiologist: Yates Decamp, MD Electrophysiologist: Lanier Prude, MD  Referring Physician: Dr Lalla Brothers   Danielle Hayes is a 73 y.o. female with a history of HTN, HLD, atrial fibrillation who presents for follow up in the [facility]. She has been on flecainide and Multaq for rhythm control. She was seen by Dr Lalla Brothers and had an afib ablation on 03/11/23. Patient is on Eliquis for stroke prevention.   Patient presents today for follow up for atrial fibrillation and Multaq monitoring. She reports that she has done well since the ablation with no interim symptoms of afib. She also reports that she has more energy. She denies chest pain or groin issues. However, she has had a pruritic rash on her chest and arms which started a few weeks after starting Multaq.    Today, she denies symptoms of palpitations, chest pain, shortness of breath, orthopnea, PND, lower extremity edema, dizziness, presyncope, syncope, snoring, daytime somnolence, bleeding, or neurologic sequela. The patient is tolerating medications without difficulties and is otherwise without complaint today.    Atrial Fibrillation Risk Factors:  she does not have symptoms or diagnosis of sleep apnea. she does not have a history of rheumatic fever.   Atrial Fibrillation Management history:  Previous antiarrhythmic drugs: flecainide, Multaq Previous cardioversions: none Previous ablations: 03/11/23 Anticoagulation history: Xarelto (rash), Eliquis  ROS- All systems are reviewed and negative except as per the HPI above.  Past Medical History:  Diagnosis Date   Anxiety    Arthritis    Asthma    exercise induced   Atrial fibrillation (HCC)    Bronchitis    C. difficile diarrhea 10/2014   states was related to antibiotic-? cefdinir   Complication of anesthesia    has big time phobia from nausea vomting last time got sick was at 73 years  old    Dysrhythmia    PVC's   Ear infection 10/2015   Hyperlipidemia    Hypertension    PONV (postoperative nausea and vomiting)     Current Outpatient Medications  Medication Sig Dispense Refill   albuterol (VENTOLIN HFA) 108 (90 Base) MCG/ACT inhaler Inhale 1-2 puffs into the lungs every 4 (four) hours as needed (Bronchitis).     apixaban (ELIQUIS) 5 MG TABS tablet Take 5 mg by mouth 2 (two) times daily.     atorvastatin (LIPITOR) 40 MG tablet Take 40 mg by mouth every morning.      BLACK COHOSH PO Take 1 mg by mouth daily.     buPROPion (WELLBUTRIN XL) 150 MG 24 hr tablet Take 150 mg by mouth every morning.     Cholecalciferol (VITAMIN D) 50 MCG (2000 UT) tablet Take 2,000 Units by mouth daily.     escitalopram (LEXAPRO) 5 MG tablet TAKE 1 TABLET BY MOUTH EVERY DAY FOR 30 DAYS for 90     estradiol (ESTRACE) 0.1 MG/GM vaginal cream Place 1 Applicatorful vaginally once a week.     fluticasone (FLONASE) 50 MCG/ACT nasal spray Place 1-2 sprays into both nostrils daily as needed (Bronchitis).     Glucosamine HCl 1000 MG TABS Take 1,000 mg by mouth daily.     MULTAQ 400 MG tablet TAKE 1 TABLET (400 MG TOTAL) BY MOUTH 2 (TWO) TIMES DAILY WITH A MEAL. 60 tablet 3   Multiple Vitamin (MULTI-VITAMIN DAILY PO) Take 1 tablet by mouth daily.     Omega 3 1200 MG CAPS Take 1,200 mg by  mouth daily.     OVER THE COUNTER MEDICATION Take 1 tablet by mouth daily. Suprema dophilus (probiotic)     pantoprazole (PROTONIX) 40 MG tablet Take 40 mg by mouth daily.     PRESCRIPTION MEDICATION Use as directed 1 Application in the mouth or throat See admin instructions. FLUOROURACIL 5% + CALCIPOTRIENE 0.005% Apply a small amount as directed as directed to lower lip before bed on Mon, Wed, Fri nights for 4 weeks straig as needed     temazepam (RESTORIL) 15 MG capsule Take 15 mg by mouth at bedtime as needed for sleep.     triamterene-hydrochlorothiazide (MAXZIDE-25) 37.5-25 MG per tablet Take 1 tablet by mouth  every morning.     No current facility-administered medications for this encounter.    Physical Exam: BP (!) 134/90   Pulse 67   Ht 5\' 2"  (1.575 m)   Wt 66.2 kg   BMI 26.70 kg/m   GEN: Well nourished, well developed in no acute distress NECK: No JVD; No carotid bruits CARDIAC: Regular rate and rhythm, no murmurs, rubs, gallops RESPIRATORY:  Clear to auscultation without rales, wheezing or rhonchi  ABDOMEN: Soft, non-tender, non-distended EXTREMITIES:  No edema; No deformity  SKIN: maculopapular rash on chest and dorsum of arms  Wt Readings from Last 3 Encounters:  04/08/23 66.2 kg  03/11/23 65.8 kg  11/21/22 68.6 kg     EKG today demonstrates  SR, 1st degree AV block Vent. rate 67 BPM PR interval 212 ms QRS duration 78 ms QT/QTcB 416/439 ms   Echo 09/13/21 demonstrated  Normal LV systolic function with visual EF 60-65%. Left ventricle cavity  is normal in size. Normal left ventricular wall thickness. Normal global  wall motion. Normal diastolic filling pattern, normal LAP. Calculated EF  68%.  Structurally normal tricuspid valve with trace regurgitation. No evidence  of pulmonary hypertension.  no prior available for comparison. Normal echocardiogram.    CHA2DS2-VASc Score = 3  The patient's score is based upon: CHF History: 0 HTN History: 1 Diabetes History: 0 Stroke History: 0 Vascular Disease History: 0 Age Score: 1 Gender Score: 1       ASSESSMENT AND PLAN: Paroxysmal Atrial Fibrillation (ICD10:  I48.0) The patient's CHA2DS2-VASc score is 3, indicating a 3.2% annual risk of stroke.   Previously failed flecainide S/p afib ablation 03/11/23 Patient appears to be maintaining SR We discussed discontinuing Multaq to see if her rash improves. She is hesitant to discontinue and would prefer to wait until her follow up in June.  Continue Eliquis 5 mg BID with no missed doses for 3 months post ablation.   Secondary Hypercoagulable State (ICD10:  D68.69) The  patient is at significant risk for stroke/thromboembolism based upon her CHA2DS2-VASc Score of 3.  Continue Apixaban (Eliquis). No bleeding issues.   HTN Stable on current regimen  High Risk Medication Monitoring (ICD 10: Z79.899) Intervals on ECG acceptable for dronedarone monitoring.        Follow up with Otilio Saber as scheduled.        Jorja Loa PA-C Afib Clinic [facility] [address] [place], [place] [address] [phone]

## 2023-04-23 DIAGNOSIS — J029 Acute pharyngitis, unspecified: Secondary | ICD-10-CM | POA: Diagnosis not present

## 2023-04-23 DIAGNOSIS — Z1152 Encounter for screening for COVID-19: Secondary | ICD-10-CM | POA: Diagnosis not present

## 2023-04-23 DIAGNOSIS — R0981 Nasal congestion: Secondary | ICD-10-CM | POA: Diagnosis not present

## 2023-04-23 DIAGNOSIS — R5383 Other fatigue: Secondary | ICD-10-CM | POA: Diagnosis not present

## 2023-04-23 DIAGNOSIS — J209 Acute bronchitis, unspecified: Secondary | ICD-10-CM | POA: Diagnosis not present

## 2023-04-23 DIAGNOSIS — R058 Other specified cough: Secondary | ICD-10-CM | POA: Diagnosis not present

## 2023-06-07 ENCOUNTER — Encounter (HOSPITAL_BASED_OUTPATIENT_CLINIC_OR_DEPARTMENT_OTHER): Payer: Self-pay

## 2023-06-11 ENCOUNTER — Ambulatory Visit: Admitting: Pulmonary Disease

## 2023-06-11 ENCOUNTER — Encounter: Payer: Self-pay | Admitting: Student

## 2023-06-11 ENCOUNTER — Ambulatory Visit: Attending: Student | Admitting: Student

## 2023-06-11 VITALS — BP 147/90 | HR 67 | Ht 62.0 in | Wt 143.0 lb

## 2023-06-11 DIAGNOSIS — D6869 Other thrombophilia: Secondary | ICD-10-CM

## 2023-06-11 DIAGNOSIS — I10 Essential (primary) hypertension: Secondary | ICD-10-CM | POA: Diagnosis not present

## 2023-06-11 DIAGNOSIS — I480 Paroxysmal atrial fibrillation: Secondary | ICD-10-CM | POA: Diagnosis not present

## 2023-06-11 NOTE — Progress Notes (Signed)
  Electrophysiology Office Note:   Date:  06/11/2023  ID:  Danielle Hayes, Danielle Hayes 1950-07-27, MRN [id number]  Primary Cardiologist: Knox Perl, MD Electrophysiologist: Boyce Byes, MD      History of Present Illness:   Danielle Hayes is a 73 y.o. female with h/o HTN, HLD, and PAF seen today for routine electrophysiology follow-up s/p Ablation.  Since last being seen in our clinic the patient reports doing very well. Stopped Multaq  about a month ago and her rash resolved. Otherwise, she denies chest pain, palpitations, dyspnea, PND, orthopnea, nausea, vomiting, dizziness, syncope, edema, weight gain, or early satiety.    Review of systems complete and found to be negative unless listed in HPI.   EP Information / Studies Reviewed:    EKG is ordered today. Personal review as below.  EKG Interpretation Date/Time:  Tuesday June 11 2023 11:04:14 EDT Ventricular Rate:  67 PR Interval:  190 QRS Duration:  80 QT Interval:  392 QTC Calculation: 414 R Axis:   78  Text Interpretation: Sinus rhythm with occasional Premature ventricular complexes Septal infarct (cited on or before 08-Apr-2023) Lateral infarct , age undetermined When compared with ECG of 08-Apr-2023 10:34, Premature ventricular complexes are now Present Lateral infarct is now Present Confirmed by Pilar Bridge [phone]) on 06/11/2023 11:06:42 AM    Arrhythmia/Device History S/p PVI and posterior wall ablation 03/2023   Physical Exam:   VS:  BP (!) 147/90   Pulse 67   Ht 5\' 2"  (1.575 m)   Wt 143 lb (64.9 kg)   SpO2 95%   BMI 26.16 kg/m    Wt Readings from Last 3 Encounters:  06/11/23 143 lb (64.9 kg)  04/08/23 146 lb (66.2 kg)  03/11/23 145 lb (65.8 kg)     GEN: No acute distress NECK: No JVD; No carotid bruits CARDIAC: Regular rate and rhythm, no murmurs, rubs, gallops RESPIRATORY:  Clear to auscultation without rales, wheezing or rhonchi  ABDOMEN: Soft, non-tender, non-distended EXTREMITIES:  No edema;  No deformity   ASSESSMENT AND PLAN:    Paroxysmal AF S/p ablation 03/11/2023 EKG today shows NSR Remain off multaq  Continue eliquis  5 mg BID for CHA2DS2VASc of at least 3   Secondary hypercoagulable state Pt on Eliquis  as above   HTN Stable on current regimen   Follow up with EP Team in 12 months  Signed, Tylene Galla, PA-C

## 2023-06-11 NOTE — Patient Instructions (Signed)
 Medication Instructions:  No medication changes today. *If you need a refill on your cardiac medications before your next appointment, please call your pharmacy*  Lab Work: No labwork ordered today. If you have labs (blood work) drawn today and your tests are completely normal, you will receive your results only by: MyChart Message (if you have MyChart) OR A paper copy in the mail If you have any lab test that is abnormal or we need to change your treatment, we will call you to review the results.  Testing/Procedures: No testing ordered today  Follow-Up: At [facility], you and your health needs are our priority.  As part of our continuing mission to provide you with exceptional heart care, our providers are all part of one team.  This team includes your primary Cardiologist (physician) and Advanced Practice Providers or APPs (Physician Assistants and Nurse Practitioners) who all work together to provide you with the care you need, when you need it.  Your next appointment:   12 month(s)  Provider:   You may see Boyce Byes, MD or one of the following Advanced Practice Providers on your designated Care Team:   Mertha Abrahams, [place] Bambi Lever "Jonelle Neri" Willard, PA-C Suzann Riddle, NP Creighton Doffing, NP    We recommend signing up for the patient portal called "MyChart".  Sign up information is provided on this After Visit Summary.  MyChart is used to connect with patients for Virtual Visits (Telemedicine).  Patients are able to view lab/test results, encounter notes, upcoming appointments, etc.  Non-urgent messages can be sent to your provider as well.   To learn more about what you can do with MyChart, go to [email or web address].

## 2023-06-19 DIAGNOSIS — H2513 Age-related nuclear cataract, bilateral: Secondary | ICD-10-CM | POA: Diagnosis not present

## 2023-07-16 DIAGNOSIS — H524 Presbyopia: Secondary | ICD-10-CM | POA: Diagnosis not present

## 2023-09-03 DIAGNOSIS — D485 Neoplasm of uncertain behavior of skin: Secondary | ICD-10-CM | POA: Diagnosis not present

## 2023-09-03 DIAGNOSIS — L570 Actinic keratosis: Secondary | ICD-10-CM | POA: Diagnosis not present

## 2023-10-29 DIAGNOSIS — C44622 Squamous cell carcinoma of skin of right upper limb, including shoulder: Secondary | ICD-10-CM | POA: Diagnosis not present

## 2023-10-29 DIAGNOSIS — Z85828 Personal history of other malignant neoplasm of skin: Secondary | ICD-10-CM | POA: Diagnosis not present

## 2023-11-08 NOTE — Progress Notes (Addendum)
 Danielle Hayes                                          MRN: [id number]   01/13/2024   The VBCI Quality Team Specialist reviewed this patient medical record for the purposes of chart review for care gap closure. The following were reviewed: abstraction for care gap closure-controlling blood pressure.    VBCI Quality Team

## 2023-11-21 DIAGNOSIS — H6122 Impacted cerumen, left ear: Secondary | ICD-10-CM | POA: Diagnosis not present

## 2023-11-21 DIAGNOSIS — H9313 Tinnitus, bilateral: Secondary | ICD-10-CM | POA: Diagnosis not present

## 2023-11-25 DIAGNOSIS — C44622 Squamous cell carcinoma of skin of right upper limb, including shoulder: Secondary | ICD-10-CM | POA: Diagnosis not present

## 2023-11-25 DIAGNOSIS — L988 Other specified disorders of the skin and subcutaneous tissue: Secondary | ICD-10-CM | POA: Diagnosis not present

## 2023-11-26 DIAGNOSIS — Z1212 Encounter for screening for malignant neoplasm of rectum: Secondary | ICD-10-CM | POA: Diagnosis not present

## 2023-12-03 DIAGNOSIS — F331 Major depressive disorder, recurrent, moderate: Secondary | ICD-10-CM | POA: Diagnosis not present

## 2023-12-03 DIAGNOSIS — K219 Gastro-esophageal reflux disease without esophagitis: Secondary | ICD-10-CM | POA: Diagnosis not present

## 2023-12-03 DIAGNOSIS — Z23 Encounter for immunization: Secondary | ICD-10-CM | POA: Diagnosis not present

## 2023-12-03 DIAGNOSIS — L80 Vitiligo: Secondary | ICD-10-CM | POA: Diagnosis not present

## 2023-12-03 DIAGNOSIS — I480 Paroxysmal atrial fibrillation: Secondary | ICD-10-CM | POA: Diagnosis not present

## 2023-12-03 DIAGNOSIS — Z0000 Encounter for general adult medical examination without abnormal findings: Secondary | ICD-10-CM | POA: Diagnosis not present

## 2023-12-03 DIAGNOSIS — E785 Hyperlipidemia, unspecified: Secondary | ICD-10-CM | POA: Diagnosis not present

## 2023-12-03 DIAGNOSIS — R1310 Dysphagia, unspecified: Secondary | ICD-10-CM | POA: Diagnosis not present

## 2023-12-03 DIAGNOSIS — R82998 Other abnormal findings in urine: Secondary | ICD-10-CM | POA: Diagnosis not present

## 2023-12-03 DIAGNOSIS — M170 Bilateral primary osteoarthritis of knee: Secondary | ICD-10-CM | POA: Diagnosis not present

## 2023-12-03 DIAGNOSIS — Z1331 Encounter for screening for depression: Secondary | ICD-10-CM | POA: Diagnosis not present

## 2023-12-03 DIAGNOSIS — Z1339 Encounter for screening examination for other mental health and behavioral disorders: Secondary | ICD-10-CM | POA: Diagnosis not present

## 2023-12-03 DIAGNOSIS — J45909 Unspecified asthma, uncomplicated: Secondary | ICD-10-CM | POA: Diagnosis not present

## 2023-12-03 DIAGNOSIS — K589 Irritable bowel syndrome without diarrhea: Secondary | ICD-10-CM | POA: Diagnosis not present

## 2023-12-03 DIAGNOSIS — Z780 Asymptomatic menopausal state: Secondary | ICD-10-CM | POA: Diagnosis not present

## 2023-12-03 DIAGNOSIS — I10 Essential (primary) hypertension: Secondary | ICD-10-CM | POA: Diagnosis not present

## 2023-12-13 ENCOUNTER — Ambulatory Visit: Admitting: Family Medicine

## 2023-12-13 ENCOUNTER — Encounter: Payer: Self-pay | Admitting: Family Medicine

## 2023-12-13 VITALS — BP 130/86 | Ht 61.0 in | Wt 147.0 lb

## 2023-12-13 DIAGNOSIS — M7741 Metatarsalgia, right foot: Secondary | ICD-10-CM | POA: Diagnosis not present

## 2023-12-13 DIAGNOSIS — M7742 Metatarsalgia, left foot: Secondary | ICD-10-CM | POA: Diagnosis not present

## 2023-12-14 NOTE — Progress Notes (Signed)
° °  Discussed the use of AI scribe software for clinical note transcription with the patient, who gave verbal consent to proceed.  History of Present Illness   Danielle Hayes is a 73 year old female who presents with foot pain and discomfort during physical activity.  Left forefoot pain - Pain localized to the left big toe and forefoot region - Onset associated with workouts, now present in street shoes - Pain is constant with walking and can awaken her at night - Pain improves after 5-10 minutes of power walking but recurs after sitting and then standing - Nighttime use of a sleeve provides some relief - Over-the-counter inserts obtained in July initially provided relief but are no longer effective  Physical activity and functional impact - Power walks approximately one hour daily -  Previously ran but transitioned to walking after knee surgery        PERTINENT  PMH / PSH: I have reviewed the patients medications, allergies, past medical and surgical history, smoking status.  Pertinent findings that relate to today's visit / issues include:   Physical Exam   EXTREMITIES: Bunion present bilaterally with the left bunion larger than the right. Loss of transverse arch with maintained longitudinal arch. No cyanosis or edema.  Mild ttp in mid  MT area on plantar surface VASC DP pulses 2+ bilaterally symmetrical SKIn no significant callous        Assessment and Plan    Metatarsalgia from loss of transverse arch. - Added MT pad support to orthotics to restore transverse arch and alleviate pain. - Provided information on ordering additional orthotic inserts for other shoes. - Instructed on proper placement and adjustment of orthotics.

## 2023-12-16 ENCOUNTER — Encounter: Payer: Self-pay | Admitting: Family Medicine

## 2024-01-07 ENCOUNTER — Other Ambulatory Visit: Payer: Self-pay | Admitting: Internal Medicine

## 2024-01-07 DIAGNOSIS — Z1231 Encounter for screening mammogram for malignant neoplasm of breast: Secondary | ICD-10-CM

## 2024-02-17 ENCOUNTER — Ambulatory Visit
